# Patient Record
Sex: Female | Born: 1935 | Race: Black or African American | Hispanic: No | Smoking: Never smoker
Health system: Southern US, Community
[De-identification: ages and names within clinical notes are randomized; demographics above are authoritative.]

## PROBLEM LIST (undated history)

## (undated) DIAGNOSIS — J45909 Unspecified asthma, uncomplicated: Secondary | ICD-10-CM

## (undated) DIAGNOSIS — E119 Type 2 diabetes mellitus without complications: Secondary | ICD-10-CM

## (undated) DIAGNOSIS — M1990 Unspecified osteoarthritis, unspecified site: Secondary | ICD-10-CM

## (undated) DIAGNOSIS — I10 Essential (primary) hypertension: Secondary | ICD-10-CM

## (undated) HISTORY — PX: CATARACT EXTRACTION, BILATERAL: SHX1313

---

## 2010-04-30 DIAGNOSIS — E119 Type 2 diabetes mellitus without complications: Secondary | ICD-10-CM

## 2010-04-30 DIAGNOSIS — I10 Essential (primary) hypertension: Secondary | ICD-10-CM | POA: Diagnosis present

## 2015-01-24 DIAGNOSIS — H9193 Unspecified hearing loss, bilateral: Secondary | ICD-10-CM | POA: Diagnosis present

## 2017-07-21 DIAGNOSIS — I350 Nonrheumatic aortic (valve) stenosis: Secondary | ICD-10-CM

## 2017-07-21 DIAGNOSIS — I272 Pulmonary hypertension, unspecified: Secondary | ICD-10-CM | POA: Diagnosis present

## 2020-06-05 ENCOUNTER — Inpatient Hospital Stay: Payer: Medicaid Other | Admitting: Anesthesiology

## 2020-06-05 ENCOUNTER — Emergency Department: Payer: Medicaid Other

## 2020-06-05 ENCOUNTER — Inpatient Hospital Stay
Admission: EM | Admit: 2020-06-05 | Discharge: 2020-06-09 | DRG: 481 | Disposition: A | Discharge status: Home / Self Care | Payer: Medicaid Other | Attending: Hospitalist | Admitting: Hospitalist

## 2020-06-05 ENCOUNTER — Encounter
Admission: EM | Disposition: A | Discharge status: Home / Self Care | Payer: Self-pay | Source: Home / Self Care | Attending: Hospitalist

## 2020-06-05 ENCOUNTER — Other Ambulatory Visit: Payer: Self-pay

## 2020-06-05 ENCOUNTER — Inpatient Hospital Stay: Payer: Medicaid Other

## 2020-06-05 ENCOUNTER — Encounter: Payer: Self-pay | Admitting: Emergency Medicine

## 2020-06-05 DIAGNOSIS — E871 Hypo-osmolality and hyponatremia: Secondary | ICD-10-CM | POA: Diagnosis present

## 2020-06-05 DIAGNOSIS — E119 Type 2 diabetes mellitus without complications: Secondary | ICD-10-CM | POA: Diagnosis present

## 2020-06-05 DIAGNOSIS — Z794 Long term (current) use of insulin: Secondary | ICD-10-CM

## 2020-06-05 DIAGNOSIS — Z79899 Other long term (current) drug therapy: Secondary | ICD-10-CM | POA: Diagnosis not present

## 2020-06-05 DIAGNOSIS — Z7982 Long term (current) use of aspirin: Secondary | ICD-10-CM

## 2020-06-05 DIAGNOSIS — I272 Pulmonary hypertension, unspecified: Secondary | ICD-10-CM | POA: Diagnosis present

## 2020-06-05 DIAGNOSIS — S7292XA Unspecified fracture of left femur, initial encounter for closed fracture: Secondary | ICD-10-CM | POA: Diagnosis present

## 2020-06-05 DIAGNOSIS — S72402A Unspecified fracture of lower end of left femur, initial encounter for closed fracture: Secondary | ICD-10-CM

## 2020-06-05 DIAGNOSIS — T148XXA Other injury of unspecified body region, initial encounter: Secondary | ICD-10-CM

## 2020-06-05 DIAGNOSIS — Z66 Do not resuscitate: Secondary | ICD-10-CM | POA: Diagnosis not present

## 2020-06-05 DIAGNOSIS — W06XXXA Fall from bed, initial encounter: Secondary | ICD-10-CM | POA: Diagnosis present

## 2020-06-05 DIAGNOSIS — R339 Retention of urine, unspecified: Secondary | ICD-10-CM | POA: Diagnosis not present

## 2020-06-05 DIAGNOSIS — I350 Nonrheumatic aortic (valve) stenosis: Secondary | ICD-10-CM | POA: Diagnosis present

## 2020-06-05 DIAGNOSIS — I10 Essential (primary) hypertension: Secondary | ICD-10-CM | POA: Diagnosis present

## 2020-06-05 DIAGNOSIS — W19XXXA Unspecified fall, initial encounter: Secondary | ICD-10-CM

## 2020-06-05 DIAGNOSIS — H9193 Unspecified hearing loss, bilateral: Secondary | ICD-10-CM | POA: Diagnosis present

## 2020-06-05 DIAGNOSIS — Y92003 Bedroom of unspecified non-institutional (private) residence as the place of occurrence of the external cause: Secondary | ICD-10-CM

## 2020-06-05 DIAGNOSIS — S72455A Nondisplaced supracondylar fracture without intracondylar extension of lower end of left femur, initial encounter for closed fracture: Secondary | ICD-10-CM | POA: Diagnosis present

## 2020-06-05 DIAGNOSIS — Z880 Allergy status to penicillin: Secondary | ICD-10-CM | POA: Diagnosis not present

## 2020-06-05 DIAGNOSIS — R410 Disorientation, unspecified: Secondary | ICD-10-CM | POA: Diagnosis not present

## 2020-06-05 DIAGNOSIS — D62 Acute posthemorrhagic anemia: Secondary | ICD-10-CM | POA: Diagnosis not present

## 2020-06-05 DIAGNOSIS — E785 Hyperlipidemia, unspecified: Secondary | ICD-10-CM | POA: Diagnosis present

## 2020-06-05 DIAGNOSIS — D509 Iron deficiency anemia, unspecified: Secondary | ICD-10-CM | POA: Diagnosis not present

## 2020-06-05 DIAGNOSIS — Z6834 Body mass index (BMI) 34.0-34.9, adult: Secondary | ICD-10-CM | POA: Diagnosis not present

## 2020-06-05 DIAGNOSIS — J45909 Unspecified asthma, uncomplicated: Secondary | ICD-10-CM | POA: Diagnosis present

## 2020-06-05 DIAGNOSIS — Z01818 Encounter for other preprocedural examination: Secondary | ICD-10-CM

## 2020-06-05 DIAGNOSIS — Z7984 Long term (current) use of oral hypoglycemic drugs: Secondary | ICD-10-CM | POA: Diagnosis not present

## 2020-06-05 DIAGNOSIS — S72445A Nondisplaced fracture of lower epiphysis (separation) of left femur, initial encounter for closed fracture: Secondary | ICD-10-CM

## 2020-06-05 DIAGNOSIS — Z20822 Contact with and (suspected) exposure to covid-19: Secondary | ICD-10-CM | POA: Diagnosis present

## 2020-06-05 DIAGNOSIS — S72409A Unspecified fracture of lower end of unspecified femur, initial encounter for closed fracture: Secondary | ICD-10-CM

## 2020-06-05 DIAGNOSIS — Z888 Allergy status to other drugs, medicaments and biological substances status: Secondary | ICD-10-CM | POA: Diagnosis not present

## 2020-06-05 DIAGNOSIS — M1990 Unspecified osteoarthritis, unspecified site: Secondary | ICD-10-CM | POA: Diagnosis present

## 2020-06-05 HISTORY — DX: Morbid (severe) obesity due to excess calories: E66.01

## 2020-06-05 HISTORY — DX: Unspecified asthma, uncomplicated: J45.909

## 2020-06-05 HISTORY — DX: Essential (primary) hypertension: I10

## 2020-06-05 HISTORY — DX: Unspecified osteoarthritis, unspecified site: M19.90

## 2020-06-05 HISTORY — DX: Type 2 diabetes mellitus without complications: E11.9

## 2020-06-05 HISTORY — PX: ORIF FEMUR FRACTURE: SHX2119

## 2020-06-05 LAB — CBC WITH DIFFERENTIAL/PLATELET
Abs Immature Granulocytes: 0.04 10*3/uL (ref 0.00–0.07)
Basophils Absolute: 0 10*3/uL (ref 0.0–0.1)
Basophils Relative: 0 %
Eosinophils Absolute: 0.1 10*3/uL (ref 0.0–0.5)
Eosinophils Relative: 2 %
HCT: 31.1 % — ABNORMAL LOW (ref 36.0–46.0)
Hemoglobin: 9.6 g/dL — ABNORMAL LOW (ref 12.0–15.0)
Immature Granulocytes: 0 %
Lymphocytes Relative: 25 %
Lymphs Abs: 2.3 10*3/uL (ref 0.7–4.0)
MCH: 20.3 pg — ABNORMAL LOW (ref 26.0–34.0)
MCHC: 30.9 g/dL (ref 30.0–36.0)
MCV: 65.6 fL — ABNORMAL LOW (ref 80.0–100.0)
Monocytes Absolute: 0.8 10*3/uL (ref 0.1–1.0)
Monocytes Relative: 8 %
Neutro Abs: 5.9 10*3/uL (ref 1.7–7.7)
Neutrophils Relative %: 65 %
Platelets: 205 10*3/uL (ref 150–400)
RBC: 4.74 MIL/uL (ref 3.87–5.11)
RDW: 15.6 % — ABNORMAL HIGH (ref 11.5–15.5)
Smear Review: NORMAL
WBC: 9.2 10*3/uL (ref 4.0–10.5)
nRBC: 0 % (ref 0.0–0.2)

## 2020-06-05 LAB — GLUCOSE, CAPILLARY
Glucose-Capillary: 149 mg/dL — ABNORMAL HIGH (ref 70–99)
Glucose-Capillary: 147 mg/dL — ABNORMAL HIGH (ref 70–99)
Glucose-Capillary: 141 mg/dL — ABNORMAL HIGH (ref 70–99)
Glucose-Capillary: 186 mg/dL — ABNORMAL HIGH (ref 70–99)
Glucose-Capillary: 210 mg/dL — ABNORMAL HIGH (ref 70–99)
Glucose-Capillary: 198 mg/dL — ABNORMAL HIGH (ref 70–99)

## 2020-06-05 LAB — HEMOGLOBIN A1C
Hgb A1c MFr Bld: 7 % — ABNORMAL HIGH (ref 4.8–5.6)
Mean Plasma Glucose: 154.2 mg/dL

## 2020-06-05 LAB — PROTIME-INR
INR: 1 (ref 0.8–1.2)
Prothrombin Time: 12.4 seconds (ref 11.4–15.2)

## 2020-06-05 LAB — BASIC METABOLIC PANEL
Anion gap: 8 (ref 5–15)
BUN: 24 mg/dL — ABNORMAL HIGH (ref 8–23)
CO2: 24 mmol/L (ref 22–32)
Calcium: 9.5 mg/dL (ref 8.9–10.3)
Chloride: 99 mmol/L (ref 98–111)
Creatinine, Ser: 0.78 mg/dL (ref 0.44–1.00)
GFR, Estimated: >60 mL/min (ref >60)
Glucose, Bld: 174 mg/dL — ABNORMAL HIGH (ref 70–99)
Potassium: 4.2 mmol/L (ref 3.5–5.1)
Sodium: 131 mmol/L — ABNORMAL LOW (ref 135–145)

## 2020-06-05 LAB — SARS CORONAVIRUS 2 BY RT PCR (HOSPITAL ORDER, PERFORMED IN ~~LOC~~ HOSPITAL LAB): SARS Coronavirus 2: NEGATIVE

## 2020-06-05 LAB — CBG MONITORING, ED: Glucose-Capillary: 150 mg/dL — ABNORMAL HIGH (ref 70–99)

## 2020-06-05 LAB — TYPE AND SCREEN
ABO/RH(D): A POS
Antibody Screen: NEGATIVE

## 2020-06-05 SURGERY — OPEN REDUCTION INTERNAL FIXATION (ORIF) DISTAL FEMUR FRACTURE
Anesthesia: Spinal | Laterality: Left

## 2020-06-05 MED ORDER — PROPOFOL 10 MG/ML IV BOLUS
INTRAVENOUS | Status: AC
  Filled 2020-06-05: qty 20

## 2020-06-05 MED ORDER — PROPOFOL 500 MG/50ML IV EMUL
INTRAVENOUS | Status: DC | PRN
  Administered 2020-06-05: 25 ug/kg/min via INTRAVENOUS

## 2020-06-05 MED ORDER — PRAVASTATIN SODIUM 20 MG PO TABS
40.0000 mg | ORAL_TABLET | Freq: Every day | ORAL | Status: DC
  Administered 2020-06-06 – 2020-06-08 (×3): 40 mg via ORAL
  Filled 2020-06-05 (×4): qty 2

## 2020-06-05 MED ORDER — CEFAZOLIN SODIUM-DEXTROSE 2-4 GM/100ML-% IV SOLN
2.0000 g | INTRAVENOUS | Status: AC
  Administered 2020-06-05: 2 g via INTRAVENOUS
  Filled 2020-06-05: qty 100

## 2020-06-05 MED ORDER — MAGNESIUM HYDROXIDE 400 MG/5ML PO SUSP: 30.0000 mL | Freq: Every day | ORAL | Status: DC | PRN

## 2020-06-05 MED ORDER — HYDROCODONE-ACETAMINOPHEN 5-325 MG PO TABS: 1.0000 | ORAL_TABLET | Freq: Four times a day (QID) | ORAL | Status: DC | PRN

## 2020-06-05 MED ORDER — KETAMINE HCL 50 MG/ML IJ SOLN
INTRAMUSCULAR | Status: DC | PRN
  Administered 2020-06-05 (×2): 25 mg via INTRAVENOUS

## 2020-06-05 MED ORDER — ENOXAPARIN SODIUM 60 MG/0.6ML ~~LOC~~ SOLN
0.5000 mg/kg | SUBCUTANEOUS | Status: DC
  Administered 2020-06-06 – 2020-06-09 (×4): 42.5 mg via SUBCUTANEOUS
  Filled 2020-06-05 (×4): qty 0.6

## 2020-06-05 MED ORDER — METOCLOPRAMIDE HCL 10 MG PO TABS: 5.0000 mg | ORAL_TABLET | Freq: Three times a day (TID) | ORAL | Status: DC | PRN

## 2020-06-05 MED ORDER — KETAMINE HCL 50 MG/5ML IJ SOSY
PREFILLED_SYRINGE | INTRAMUSCULAR | Status: AC
  Filled 2020-06-05: qty 5

## 2020-06-05 MED ORDER — METOPROLOL TARTRATE 25 MG PO TABS
12.5000 mg | ORAL_TABLET | Freq: Two times a day (BID) | ORAL | Status: DC
  Administered 2020-06-06 – 2020-06-09 (×5): 12.5 mg via ORAL
  Filled 2020-06-05 (×6): qty 1

## 2020-06-05 MED ORDER — ACETAMINOPHEN 325 MG PO TABS
325.0000 mg | ORAL_TABLET | Freq: Four times a day (QID) | ORAL | Status: DC | PRN
  Administered 2020-06-06: 650 mg via ORAL
  Administered 2020-06-06: 325 mg via ORAL
  Administered 2020-06-07 (×2): 650 mg via ORAL
  Filled 2020-06-05 (×4): qty 2

## 2020-06-05 MED ORDER — PHENYLEPHRINE HCL (PRESSORS) 10 MG/ML IV SOLN
INTRAVENOUS | Status: DC | PRN
  Administered 2020-06-05 (×3): 100 ug via INTRAVENOUS

## 2020-06-05 MED ORDER — BUPIVACAINE HCL (PF) 0.5 % IJ SOLN
INTRAMUSCULAR | Status: AC
  Filled 2020-06-05: qty 10

## 2020-06-05 MED ORDER — ONDANSETRON HCL 4 MG/2ML IJ SOLN: 4.0000 mg | Freq: Four times a day (QID) | INTRAMUSCULAR | Status: DC | PRN

## 2020-06-05 MED ORDER — TRAMADOL HCL 50 MG PO TABS: 50.0000 mg | ORAL_TABLET | Freq: Four times a day (QID) | ORAL | Status: DC | PRN

## 2020-06-05 MED ORDER — OXYCODONE HCL 5 MG PO TABS: 5.0000 mg | ORAL_TABLET | Freq: Once | ORAL | Status: DC | PRN

## 2020-06-05 MED ORDER — DIPHENHYDRAMINE HCL 12.5 MG/5ML PO ELIX
12.5000 mg | ORAL_SOLUTION | ORAL | Status: DC | PRN
Start: 2020-06-05 — End: 2020-06-09

## 2020-06-05 MED ORDER — FLEET ENEMA 7-19 GM/118ML RE ENEM: 1.0000 | ENEMA | Freq: Once | RECTAL | Status: DC | PRN

## 2020-06-05 MED ORDER — ASPIRIN EC 81 MG PO TBEC
81.0000 mg | DELAYED_RELEASE_TABLET | Freq: Every day | ORAL | Status: DC
  Administered 2020-06-07 – 2020-06-09 (×3): 81 mg via ORAL
  Filled 2020-06-05 (×3): qty 1

## 2020-06-05 MED ORDER — MORPHINE SULFATE (PF) 4 MG/ML IV SOLN
4.0000 mg | INTRAVENOUS | Status: DC | PRN
  Administered 2020-06-05: 4 mg via INTRAVENOUS
  Filled 2020-06-05: qty 1

## 2020-06-05 MED ORDER — SODIUM CHLORIDE 0.9 % IV SOLN: INTRAVENOUS | Status: DC

## 2020-06-05 MED ORDER — PROPOFOL 10 MG/ML IV BOLUS
INTRAVENOUS | Status: DC | PRN
  Administered 2020-06-05: 25 mg via INTRAVENOUS

## 2020-06-05 MED ORDER — QUETIAPINE FUMARATE 25 MG PO TABS: 50.0000 mg | ORAL_TABLET | Freq: Every evening | ORAL | Status: DC | PRN

## 2020-06-05 MED ORDER — FENTANYL CITRATE (PF) 100 MCG/2ML IJ SOLN
25.0000 ug | Freq: Once | INTRAMUSCULAR | Status: AC
  Administered 2020-06-05: 25 ug via INTRAVENOUS
  Filled 2020-06-05: qty 2

## 2020-06-05 MED ORDER — OXYCODONE HCL 5 MG/5ML PO SOLN: 5.0000 mg | Freq: Once | ORAL | Status: DC | PRN

## 2020-06-05 MED ORDER — ADULT MULTIVITAMIN W/MINERALS CH
1.0000 | ORAL_TABLET | Freq: Every day | ORAL | Status: DC
  Administered 2020-06-07 – 2020-06-09 (×3): 1 via ORAL
  Filled 2020-06-05 (×4): qty 1

## 2020-06-05 MED ORDER — BUPIVACAINE HCL (PF) 0.5 % IJ SOLN
INTRAMUSCULAR | Status: DC | PRN
  Administered 2020-06-05: 2 mL via INTRATHECAL

## 2020-06-05 MED ORDER — MORPHINE SULFATE (PF) 2 MG/ML IV SOLN
0.5000 mg | INTRAVENOUS | Status: DC | PRN
  Administered 2020-06-05 (×2): 0.5 mg via INTRAVENOUS
  Filled 2020-06-05 (×2): qty 1

## 2020-06-05 MED ORDER — INSULIN ASPART 100 UNIT/ML ~~LOC~~ SOLN
0.0000 [IU] | SUBCUTANEOUS | Status: DC
  Administered 2020-06-05: 2 [IU] via SUBCUTANEOUS
  Administered 2020-06-05: 5 [IU] via SUBCUTANEOUS
  Administered 2020-06-06 (×3): 3 [IU] via SUBCUTANEOUS
  Administered 2020-06-06: 5 [IU] via SUBCUTANEOUS
  Administered 2020-06-07 (×2): 2 [IU] via SUBCUTANEOUS
  Administered 2020-06-07: 3 [IU] via SUBCUTANEOUS
  Administered 2020-06-07: 2 [IU] via SUBCUTANEOUS
  Administered 2020-06-08: 3 [IU] via SUBCUTANEOUS
  Administered 2020-06-08: 2 [IU] via SUBCUTANEOUS
  Administered 2020-06-08 – 2020-06-09 (×3): 3 [IU] via SUBCUTANEOUS
  Filled 2020-06-05 (×15): qty 1

## 2020-06-05 MED ORDER — GLUCERNA SHAKE PO LIQD
237.0000 mL | Freq: Three times a day (TID) | ORAL | Status: DC
  Administered 2020-06-06 – 2020-06-09 (×5): 237 mL via ORAL
  Filled 2020-06-05 (×5): qty 237

## 2020-06-05 MED ORDER — FENTANYL CITRATE (PF) 100 MCG/2ML IJ SOLN
INTRAMUSCULAR | Status: AC
  Filled 2020-06-05: qty 2

## 2020-06-05 MED ORDER — CEFAZOLIN SODIUM-DEXTROSE 2-4 GM/100ML-% IV SOLN
INTRAVENOUS | Status: AC
  Filled 2020-06-05: qty 100

## 2020-06-05 MED ORDER — METOCLOPRAMIDE HCL 5 MG/ML IJ SOLN: 5.0000 mg | Freq: Three times a day (TID) | INTRAMUSCULAR | Status: DC | PRN

## 2020-06-05 MED ORDER — LIDOCAINE HCL (PF) 2 % IJ SOLN
INTRAMUSCULAR | Status: DC | PRN
  Administered 2020-06-05: 50 mg

## 2020-06-05 MED ORDER — SODIUM CHLORIDE 0.9 % IV SOLN
INTRAVENOUS | Status: DC | PRN
  Administered 2020-06-05: 35 ug/min via INTRAVENOUS

## 2020-06-05 MED ORDER — ACETAMINOPHEN 500 MG PO TABS
1000.0000 mg | ORAL_TABLET | Freq: Once | ORAL | Status: AC
  Administered 2020-06-05: 1000 mg via ORAL
  Filled 2020-06-05: qty 2

## 2020-06-05 MED ORDER — DOCUSATE SODIUM 100 MG PO CAPS
100.0000 mg | ORAL_CAPSULE | Freq: Two times a day (BID) | ORAL | Status: DC
  Administered 2020-06-06 – 2020-06-09 (×6): 100 mg via ORAL
  Filled 2020-06-05 (×7): qty 1

## 2020-06-05 MED ORDER — BISACODYL 10 MG RE SUPP: 10.0000 mg | Freq: Every day | RECTAL | Status: DC | PRN

## 2020-06-05 MED ORDER — BUPIVACAINE HCL (PF) 0.5 % IJ SOLN
INTRAMUSCULAR | Status: AC
  Filled 2020-06-05: qty 30

## 2020-06-05 MED ORDER — LIDOCAINE HCL (PF) 2 % IJ SOLN
INTRAMUSCULAR | Status: AC
  Filled 2020-06-05: qty 5

## 2020-06-05 MED ORDER — FENTANYL CITRATE (PF) 100 MCG/2ML IJ SOLN
25.0000 ug | INTRAMUSCULAR | Status: DC | PRN
  Administered 2020-06-05: 25 ug via INTRAVENOUS

## 2020-06-05 MED ORDER — NEOMYCIN-POLYMYXIN B GU 40-200000 IR SOLN
Status: AC
  Filled 2020-06-05: qty 4

## 2020-06-05 MED ORDER — ACETAMINOPHEN 500 MG PO TABS: 500.0000 mg | ORAL_TABLET | Freq: Four times a day (QID) | ORAL | Status: AC

## 2020-06-05 MED ORDER — CEFAZOLIN SODIUM-DEXTROSE 2-4 GM/100ML-% IV SOLN
2.0000 g | Freq: Four times a day (QID) | INTRAVENOUS | Status: AC
  Administered 2020-06-05 – 2020-06-06 (×3): 2 g via INTRAVENOUS
  Filled 2020-06-05 (×3): qty 100

## 2020-06-05 MED ORDER — ONDANSETRON HCL 4 MG PO TABS: 4.0000 mg | ORAL_TABLET | Freq: Four times a day (QID) | ORAL | Status: DC | PRN

## 2020-06-05 MED ORDER — MORPHINE SULFATE (PF) 2 MG/ML IV SOLN: 0.5000 mg | INTRAVENOUS | Status: DC | PRN

## 2020-06-05 SURGICAL SUPPLY — 48 items
BIT DRILL 4.3 (BIT) ×3 IMPLANT
BIT DRILL 4.3X300MM (BIT) ×1 IMPLANT
BNDG COHESIVE 6X5 TAN STRL LF (GAUZE/BANDAGES/DRESSINGS) ×2 IMPLANT
BNDG ELASTIC 6X5.8 VLCR STR LF (GAUZE/BANDAGES/DRESSINGS) IMPLANT
BRACE KNEE POST OP SHORT (BRACE) IMPLANT
CANISTER SUCT 1200ML W/VALVE (MISCELLANEOUS) ×2 IMPLANT
CAP LOCK NCB (Cap) ×10 IMPLANT
CHLORAPREP W/TINT 26 (MISCELLANEOUS) ×2 IMPLANT
COVER BACK TABLE REUSABLE LG (DRAPES) ×2 IMPLANT
COVER WAND RF STERILE (DRAPES) ×2 IMPLANT
DRAPE C-ARM XRAY 36X54 (DRAPES) ×2 IMPLANT
DRAPE C-ARMOR (DRAPES) ×2 IMPLANT
DRAPE INCISE IOBAN 66X60 STRL (DRAPES) ×4 IMPLANT
DRAPE SPLIT 6X30 W/TAPE (DRAPES) ×4 IMPLANT
DRAPE U-SHAPE 47X51 STRL (DRAPES) ×2 IMPLANT
DRILL BIT 4.3 (BIT) ×2
DRSG OPSITE POSTOP 4X6 (GAUZE/BANDAGES/DRESSINGS) ×2 IMPLANT
DRSG OPSITE POSTOP 4X8 (GAUZE/BANDAGES/DRESSINGS) IMPLANT
ELECT REM PT RETURN 9FT ADLT (ELECTROSURGICAL) ×2
ELECTRODE REM PT RTRN 9FT ADLT (ELECTROSURGICAL) ×1 IMPLANT
GAUZE SPONGE 4X4 12PLY STRL (GAUZE/BANDAGES/DRESSINGS) ×2 IMPLANT
GAUZE XEROFORM 1X8 LF (GAUZE/BANDAGES/DRESSINGS) ×2 IMPLANT
GLOVE BIO SURGEON STRL SZ8 (GLOVE) ×4 IMPLANT
GLOVE INDICATOR 8.0 STRL GRN (GLOVE) ×2 IMPLANT
GOWN STRL REUS W/ TWL LRG LVL3 (GOWN DISPOSABLE) ×1 IMPLANT
GOWN STRL REUS W/ TWL XL LVL3 (GOWN DISPOSABLE) ×1 IMPLANT
GOWN STRL REUS W/TWL LRG LVL3 (GOWN DISPOSABLE) ×2
GOWN STRL REUS W/TWL XL LVL3 (GOWN DISPOSABLE) ×2
K-WIRE 2.0 (WIRE) ×2
K-WIRE FXSTD 280X2XNS SS (WIRE) ×1
KIT TURNOVER KIT A (KITS) ×2 IMPLANT
KWIRE FXSTD 280X2XNS SS (WIRE) ×1 IMPLANT
MANIFOLD NEPTUNE II (INSTRUMENTS) ×2 IMPLANT
NS IRRIG 1000ML POUR BTL (IV SOLUTION) ×2 IMPLANT
PACK HIP PROSTHESIS (MISCELLANEOUS) ×2 IMPLANT
PLATE 9H LEFT NCB (Plate) ×2 IMPLANT
SCREW 5.0 80MM (Screw) ×4 IMPLANT
SCREW NCB 3.5X75X5X6.2XST (Screw) ×1 IMPLANT
SCREW NCB 5.0X36MM (Screw) ×4 IMPLANT
SCREW NCB 5.0X38 (Screw) ×2 IMPLANT
SCREW NCB 5.0X46MM (Screw) ×2 IMPLANT
SCREW NCB 5.0X75MM (Screw) ×2 IMPLANT
SCREW NCB 5.0X85MM (Screw) ×2 IMPLANT
SPONGE LAP 18X18 RF (DISPOSABLE) ×2 IMPLANT
STAPLER SKIN PROX 35W (STAPLE) ×2 IMPLANT
STOCKINETTE M/LG 89821 (MISCELLANEOUS) ×2 IMPLANT
SUT VIC AB 2-0 CT1 27 (SUTURE) ×4
SUT VIC AB 2-0 CT1 TAPERPNT 27 (SUTURE) ×2 IMPLANT

## 2020-06-05 NOTE — Anesthesia Procedure Notes (Signed)
Spinal  Patient location during procedure: OR Staffing Performed: anesthesiologist and resident/CRNA  Anesthesiologist: Piscitello, Precious Haws, MD Resident/CRNA: Rolla Plate, CRNA Preanesthetic Checklist Completed: patient identified, IV checked, site marked, risks and benefits discussed, surgical consent, monitors and equipment checked, pre-op evaluation and timeout performed Spinal Block Patient position: right lateral decubitus Prep: ChloraPrep and site prepped and draped Patient monitoring: heart rate, continuous pulse ox, blood pressure and cardiac monitor Approach: midline Location: L4-5 Injection technique: single-shot Needle Needle type: Introducer and Pencan  Needle gauge: 24 G Needle length: 9 cm Assessment Sensory level: T10 Additional Notes Negative paresthesia. Negative blood return. Positive free-flowing CSF. Expiration date of kit checked and confirmed. Patient tolerated procedure well, without complications.

## 2020-06-05 NOTE — Op Note (Signed)
06/05/2020  3:29 PM  Patient:   Natalie Adams  Pre-Op Diagnosis:   Nondisplaced left supracondylar femur fracture.  Post-Op Diagnosis:   Same  Procedure:   Open reduction and internal fixation of left supracondylar femur fracture.  Surgeon:   Maryagnes Amos, MD  Assistant:   None  Anesthesia:   Spinal  Findings:   As above.  Complications:   None  Fluids:   500 cc crystalloid  EBL:   50 cc  UOP:   None  TT:   None  Drains:   None  Closure:   Staples  Implants:   [employer] 9-hole precontoured supracondylar femoral plate with 5 distal screws with locking caps, 1 lag screw, and 3 bicortical proximal screws  Brief Clinical Note:   The patient is an 87 year old female who sustained the above-noted injury last evening when she apparently lost her balance and fell trying to get out of her wheelchair. She was brought to the emergency room where x-rays demonstrated the above-noted injury. The patient has been cleared medically and presents at this time for definitive management of his injury.  Procedure:   The patient was brought into the operating room. After adequate spinal anesthesia was obtained, anesthesia were obtained, the patient was lain in the supine position. The patient's left lower extremity was prepped with ChloraPrep solution before being draped sterilely. Preoperative antibiotics were administered. A timeout was performed to verify the appropriate surgical site.   An approximately 10-12 cm incision was made along the lateral aspect of the distal femur, extending down to the joint line laterally. The incision was carried down through the subcutaneous tissues to expose the iliotibial band. This was split the length of the incision to expose the lateral aspect of the lateral femoral condyle. A 9-hole [employer] precontoured supracondylar femoral plate was selected. The plate was carefully positioned utilizing fluoroscopic imaging in AP and lateral projections before  it was temporarily secured using K wires proximally and distally.    Utilizing a short incision laterally, a bicortical screw was placed just proximal to the fracture site to help reduce the plate to the femoral shaft. Distally, the plate was secured using five fully threaded cancellus screws. The adequacy of screw position was verified fluoroscopically in AP and lateral projections and found to be excellent. Each of the screws was covered with a locking cap.  Proximally, the plate was secured using two more bicortical screws of appropriate length. These screws were placed through stab incisions. Locking caps were placed over the distal 2 screws. Again the adequacy of hardware position and overall femoral alignment/fracture reduction was verified fluoroscopically in AP and lateral projections and found to be excellent.  The wounds were copiously irrigated with bacitracin saline solution using bulb irrigation before the iliotibial band was reapproximated using #0 Vicryl interrupted sutures. The subcutaneous tissues were closed in two layers using 2-0 Vicryl interrupted sutures before the skin was closed with staples. A total of 30 cc of 0.5% Sensorcaine with epinephrine was injected in around the incision sites to help with postoperative analgesia before a sterile occlusive dressing was applied to the thigh. The patient was then placed into a hinged knee brace with hinges set at 0-90 degrees but locked in extension. The patient was then awakened and returned to the recovery room in satisfactory condition after tolerating the procedure well.

## 2020-06-05 NOTE — H&P (Addendum)
History and Physical    Natalie Adams NIO:[id number] DOB: 1929-10-27 DOA: 06/05/2020  PCP: [facility]   Patient coming from: home  I have personally briefly reviewed patient's old medical records in [facility]  Chief Complaint: Fall, left leg pain  HPI: Natalie Adams is a 87 y.o. female with medical history significant for HTN, DM, bilateral hearing loss, mild aortic stenosis and pulmonary hypertension, brought to the emergency room for evaluation of left leg pain following a fall onto her knees about 20 hours prior resulting in left leg pain.  Most of the history is provided by daughter at bedside due to mother's hearing loss.  Patient apparently tried to get out of bed unassisted, falling onto her knees.  Was helped back up to bed with [facility] lift but as the day progressed was noted to have pain and swelling to the left thigh, just above the knee.  Patient was previously in her usual state of health.  No recent illness.  Denies recent fever, chills, cough or shortness of breath, nausea vomiting, abdominal pain or diarrhea or dysuria. ED Course: On arrival in the emergency room, vitals were all within normal limits.  Blood work significant for anemia of 9.6, with no recent baseline for comparison and mild hyponatremia of 131. EKG : Pending Imaging: X-ray left knee showing nondisplaced spiral distal femur fracture.  Chest x-ray clear hip x-ray showing pelvic ring intact  The ED provider contacted orthopedist Dr. Joice Lofts who will take patient to the OR later today  Review of Systems: Limited by hearing impairment   Past Medical History:  Diagnosis Date  . Arthritis   . Asthma   . Diabetes mellitus without complication (HCC)   . Hypertension   . Morbid obesity (HCC)     Past Surgical History:  Procedure Laterality Date  . CATARACT EXTRACTION, BILATERAL       reports that she has never smoked. She has never used smokeless tobacco. No history on file for alcohol use  and drug use.  Allergies  Allergen Reactions  . Amoxicillin-Pot Clavulanate Diarrhea  . Hydrochlorothiazide Other (See Comments)    "she got shaky and fell" "she got shaky and fell"     History reviewed. No pertinent family history.    Prior to Admission medications   Medication Sig Start Date End Date Taking? Authorizing Provider  amLODipine (NORVASC) 10 MG tablet Take by mouth. 05/01/17  Yes [provider]  aspirin 81 MG EC tablet Take by mouth. 05/15/09  Yes [provider]  insulin lispro (HUMALOG) 100 UNIT/ML injection Sliding scale insulin # of units = (BS-150/50) TID AC 05/09/16  Yes [provider]  insulin NPH Human (NOVOLIN N) 100 UNIT/ML injection Inject 15 Units into the skin daily before breakfast. 05/09/16  Yes [provider]  Multiple Vitamins tablet Take by mouth. 02/11/11  Yes [provider]  ascorbic acid (VITAMIN C) 1000 MG tablet Take by mouth.    [provider]  Cholecalciferol 125 MCG (5000 UT) TABS Take 1 tablet by mouth daily.    [provider]  Dietary Management Product (ALGESIS) TABS Take by mouth.    [provider]  ipratropium-albuterol (DUONEB) 0.5-2.5 (3) MG/3ML SOLN SMARTSIG:1 Ampule(s) Via Nebulizer Every 6 Hours PRN 03/25/20   [provider]  metFORMIN (GLUCOPHAGE) 1000 MG tablet Take by mouth in the morning and at bedtime. 05/08/20   [provider]  metoprolol tartrate (LOPRESSOR) 25 MG tablet Take 12.5 mg by mouth 2 (  two) times daily. 05/08/20   [provider]  Omega-3 Fatty Acids (FISH OIL) 1200 MG CAPS Take by mouth.    [provider]  pravastatin (PRAVACHOL) 40 MG tablet Take 40 mg by mouth at bedtime. 05/08/20   [provider]    Physical Exam: Vitals:   06/05/20 0022 06/05/20 0026 06/05/20 0239  BP:  123/82 (!) 147/73  Pulse:  92 95  Resp:  18 17  Temp:  98.4 F (36.9 C)   TempSrc:  Oral   SpO2:  99% 100%  Weight: 86.2  kg    Height: 5\' 2"  (1.575 m)       Vitals:   06/05/20 0022 06/05/20 0026 06/05/20 0239  BP:  123/82 (!) 147/73  Pulse:  92 95  Resp:  18 17  Temp:  98.4 F (36.9 C)   TempSrc:  Oral   SpO2:  99% 100%  Weight: 86.2 kg    Height: 5\' 2"  (1.575 m)        Constitutional: Alert and oriented x 3 .  In mild pain distress HEENT:      Head: Normocephalic and atraumatic.         Eyes: PERLA, EOMI, Conjunctivae are normal. Sclera is non-icteric.       Mouth/Throat: Mucous membranes are moist.       Neck: Supple with no signs of meningismus. Cardiovascular: Regular rate and rhythm. No murmurs, gallops, or rubs. 2+ symmetrical distal pulses are present . No JVD. No LE edema Respiratory: Respiratory effort normal .Lungs sounds clear bilaterally. No wheezes, crackles, or rhonchi.  Gastrointestinal: Soft, non tender, and non distended with positive bowel sounds.  Genitourinary: No CVA tenderness. Musculoskeletal:  Swelling left anterior thigh just above the knee with tenderness. No cyanosis, or erythema of extremities. Neurologic:  Face is symmetric. Moving all extremities. No gross focal neurologic deficits . Skin: Skin is warm, dry.  No rash or ulcers Psychiatric: Mood and affect are normal    Labs on Admission: I have personally reviewed following labs and imaging studies  CBC: Recent Labs  Lab 06/05/20 0222  WBC 9.2  NEUTROABS 5.9  HGB 9.6*  HCT 31.1*  MCV 65.6*  PLT 205   Basic Metabolic Panel: Recent Labs  Lab 06/05/20 0222  NA 131*  K 4.2  CL 99  CO2 24  GLUCOSE 174*  BUN 24*  CREATININE 0.78  CALCIUM 9.5   GFR: Estimated Creatinine Clearance: 49.5 mL/min (by C-G formula based on SCr of 0.78 mg/dL). Liver Function Tests: No results for input(s): AST, ALT, ALKPHOS, BILITOT, PROT, ALBUMIN in the last 168 hours. No results for input(s): LIPASE, AMYLASE in the last 168 hours. No results for input(s): AMMONIA in the last 168 hours. Coagulation Profile: Recent  Labs  Lab 06/05/20 0300  INR 1.0   Cardiac Enzymes: No results for input(s): CKTOTAL, CKMB, CKMBINDEX, TROPONINI in the last 168 hours. BNP (last 3 results) No results for input(s): PROBNP in the last 8760 hours. HbA1C: No results for input(s): HGBA1C in the last 72 hours. CBG: No results for input(s): GLUCAP in the last 168 hours. Lipid Profile: No results for input(s): CHOL, HDL, LDLCALC, TRIG, CHOLHDL, LDLDIRECT in the last 72 hours. Thyroid Function Tests: No results for input(s): TSH, T4TOTAL, FREET4, T3FREE, THYROIDAB in the last 72 hours. Anemia Panel: No results for input(s): VITAMINB12, FOLATE, FERRITIN, TIBC, IRON, RETICCTPCT in the last 72 hours. Urine analysis: No results found for: COLORURINE, APPEARANCEUR, LABSPEC, PHURINE, GLUCOSEU, HGBUR, BILIRUBINUR, KETONESUR,  PROTEINUR, UROBILINOGEN, NITRITE, LEUKOCYTESUR  Radiological Exams on Admission: DG Chest Port 1 View  Result Date: 06/05/2020 CLINICAL DATA:  Preop evaluation for upcoming orthopedic surgery, known left EXAM: PORTABLE CHEST 1 VIEW COMPARISON:  None. FINDINGS: Cardiac shadow is mildly enlarged. The lungs are well aerated bilaterally. No focal infiltrate or effusion is seen. No bony abnormality is noted. IMPRESSION: No acute abnormality seen. Electronically Signed   By: Alcide Clever M.D.   On: 06/05/2020 03:24   DG Knee Complete 4 Views Left  Result Date: 06/05/2020 CLINICAL DATA:  Pain and swelling after fall EXAM: LEFT KNEE - COMPLETE 4+ VIEW COMPARISON:  None. FINDINGS: There is a nondisplaced spiral type fracture of the distal left femur. No dislocation at the knee. IMPRESSION: Nondisplaced spiral type fracture of the distal left femur. Electronically Signed   By: Deatra Robinson M.D.   On: 06/05/2020 02:33   DG Hip Unilat W or Wo Pelvis 2-3 Views Left  Result Date: 06/05/2020 CLINICAL DATA:  Recent fall with left hip pain, initial encounter EXAM: DG HIP (WITH OR WITHOUT PELVIS) 3V LEFT COMPARISON:  None.  FINDINGS: Pelvic ring is intact. Mild degenerative changes of the hip joints are noted. No acute fracture or dislocation is seen. No soft tissue abnormality is noted. IMPRESSION: No acute abnormality noted. Electronically Signed   By: Alcide Clever M.D.   On: 06/05/2020 00:39     Assessment/Plan 87 year old female with history of HTN, DM, bilateral hearing loss, mild aortic stenosis and pulmonary hypertension, with distal femur fracture following an unwitnessed fall several hours earlier.    Fracture of distal left femur (HCC)   Accidental fall from bed, initial encounter   Preoperative clearance -Unwitnessed fall onto knees with development of pain and swelling just above the left knee several hours later -X-ray left knee showing nondisplaced spiral fracture of the distal left femur -N.p.o. after midnight -Pain control -Further orders per orthopedics, Dr. Joice Lofts who will take patient to the OR later -Patient at low to moderate risk for perioperative cardiopulmonary complications once no acute findings on EKG)    Hypertension, benign -Continue home amlodipine and metoprolol    Type II diabetes mellitus (HCC) -Sliding scale insulin coverage    Pulmonary hypertension (HCC) -No acute concerns at this time    Bilateral hearing loss -Increase nursing assistance for communication    Aortic stenosis -No acute concerns at this time    DVT prophylaxis: SCDs Code Status: full code  Family Communication: Daughter Disposition Plan: Back to previous home environment Consults called: Orthopedics Status:At the time of admission, it appears that the appropriate admission status for this patient is INPATIENT. This is judged to be reasonable and necessary in order to provide the required intensity of service to ensure the patient's safety given the presenting symptoms, physical exam findings, and initial radiographic and laboratory data in the context of their  Comorbid conditions.   Patient  requires inpatient status due to high intensity of service, high risk for further deterioration and high frequency of surveillance required.   I certify that at the point of admission it is my clinical judgment that the patient will require inpatient hospital care spanning beyond 2 midnights     Andris Baumann MD [facility]     06/05/2020, 4:14 AM

## 2020-06-05 NOTE — Progress Notes (Signed)
PHARMACIST - PHYSICIAN COMMUNICATION  CONCERNING:  Enoxaparin (Lovenox) for DVT Prophylaxis    RECOMMENDATION: Patient was prescribed enoxaprin 40mg  q24 hours for VTE prophylaxis.   Filed Weights   06/05/20 0022 06/05/20 1245  Weight: 86.2 kg (190 lb) 86.2 kg (190 lb 0.6 oz)    Body mass index is 34.76 kg/m.  Estimated Creatinine Clearance: 49.5 mL/min (by C-G formula based on SCr of 0.78 mg/dL).   Based on [facility] policy patient is candidate for enoxaparin 0.5mg /kg TBW SQ every 24 hours based on BMI being >30.  DESCRIPTION: Pharmacy has adjusted enoxaparin dose per [facility] policy.  Patient is now receiving enoxaparin 42.5 mg every 24 hours   [facility], PharmD Pharmacy Resident  06/05/2020 5:40 PM

## 2020-06-05 NOTE — Transfer of Care (Signed)
Immediate Anesthesia Transfer of Care Note  Patient: Natalie Adams  Procedure(s) Performed: OPEN REDUCTION INTERNAL FIXATION (ORIF) DISTAL FEMUR FRACTURE (Left )  Patient Location: PACU  Anesthesia Type:Spinal  Level of Consciousness: awake  Airway & Oxygen Therapy: Patient Spontanous Breathing  Post-op Assessment: Report given to RN and Post -op Vital signs reviewed and stable  Post vital signs: Reviewed  Last Vitals:  Vitals Value Taken Time  BP 126/63 06/05/20 1527  Temp    Pulse 77 06/05/20 1529  Resp 19 06/05/20 1529  SpO2 100 % 06/05/20 1529  Vitals shown include unvalidated device data.  Last Pain:  Vitals:   06/05/20 1245  TempSrc: Temporal  PainSc:          Complications: No complications documented.

## 2020-06-05 NOTE — ED Provider Notes (Addendum)
[facility] Emergency Department Provider Note  ____________________________________________   Event Date/Time   First MD Initiated Contact with Patient 06/05/20 0125     (approximate)  I have reviewed the triage vital signs and the nursing notes.   HISTORY  Chief Complaint Fall  Level 5 caveat: The patient is elderly and very hard of hearing.  The daughter has been caring for her for years and provides most of the history.  HPI Natalie Adams is a 87 y.o. female with medical history as listed below who presents by EMS for evaluation after a fall.  The fall occurred nearly 24 hours ago.  Her daughter is at bedside and provides all of the patient's care at home.  She said that she typically checks on her mother multiple times a night and she waited a little bit longer to let her sleep, but then at around 3:45 AM  she heard a thud.  She went and found her mother on her knees on the floor.  She was able to get her back into the bed using [facility].  Her mother seemed fine at breakfast and was able to eat and went about her usual activities, but then she started complaining of pain in her left leg over the course the day.  The daughter felt like there is some swelling of the leg particularly around the knee and by the tonight the patient would cry out when trying to move her leg around.  EMS was called and brought her to the ED.  The patient is not able to provide much history but does confirm pain in the left leg, worse when she moves it or when we push on it.  She does not seem to have any other complaints and her daughter says she is otherwise at baseline without any other complaints or concerns.  She has not been urinating more frequently than usual and the urine has not had a foul smell.  She has not been vomiting or complaining of chest pain or abdominal pain.        Past Medical History:  Diagnosis Date  . Arthritis   . Asthma   . Diabetes mellitus  without complication (HCC)   . Hypertension   . Morbid obesity [facility](HCC)     Patient Active Problem List   Diagnosis Date Noted  . Fracture of distal femur (HCC) 06/05/2020  . Accidental fall from bed, initial encounter 06/05/2020  . Preoperative clearance 06/05/2020  . Pulmonary hypertension (HCC) 07/21/2017  . Aortic stenosis 07/21/2017  . Bilateral hearing loss 01/24/2015  . Hypertension, benign 04/30/2010  . Type II diabetes mellitus (HCC) 04/30/2010    Past Surgical History:  Procedure Laterality Date  . CATARACT EXTRACTION, BILATERAL      Prior to Admission medications   Medication Sig Start Date End Date Taking? Authorizing Provider  amLODipine (NORVASC) 10 MG tablet Take by mouth. 05/01/17  Yes [provider]  aspirin 81 MG EC tablet Take by mouth. 05/15/09  Yes [provider]  insulin lispro (HUMALOG) 100 UNIT/ML injection Sliding scale insulin # of units = (BS-150/50) TID AC 05/09/16  Yes [provider]  insulin NPH Human (NOVOLIN N) 100 UNIT/ML injection Inject 15 Units into the skin daily before breakfast. 05/09/16  Yes [provider]  Multiple Vitamins tablet Take by mouth. 02/11/11  Yes [provider]  ascorbic acid (VITAMIN C) 1000 MG tablet Take by mouth.    [provider]  Cholecalciferol  125 MCG (5000 UT) TABS Take 1 tablet by mouth daily.    [provider]  Dietary Management Product (ALGESIS) TABS Take by mouth.    [provider]  ipratropium-albuterol (DUONEB) 0.5-2.5 (3) MG/3ML SOLN SMARTSIG:1 Ampule(s) Via Nebulizer Every 6 Hours PRN 03/25/20   [provider]  metFORMIN (GLUCOPHAGE) 1000 MG tablet Take by mouth in the morning and at bedtime. 05/08/20   [provider]  metoprolol tartrate (LOPRESSOR) 25 MG tablet Take 12.5 mg by mouth 2 (two) times daily. 05/08/20   [provider]  Omega-3 Fatty Acids (FISH OIL) 1200 MG CAPS Take by mouth.    [provider]  pravastatin (PRAVACHOL) 40 MG tablet Take 40 mg by mouth at bedtime. 05/08/20   [provider]    Allergies Amoxicillin-pot clavulanate and Hydrochlorothiazide  History reviewed. No pertinent family history.  Social History Social History   Tobacco Use  . Smoking status: Never Smoker  . Smokeless tobacco: Never Used    Review of Systems Level 5 caveat: The patient is elderly and very hard of hearing.  The daughter has been caring for her for years and provides most of the history.  ____________________________________________   PHYSICAL EXAM:  VITAL SIGNS: ED Triage Vitals  Enc Vitals Group     BP 06/05/20 0026 123/82     Pulse Rate 06/05/20 0026 92     Resp 06/05/20 0026 18     Temp 06/05/20 0026 98.4 F (36.9 C)     Temp Source 06/05/20 0026 Oral     SpO2 06/05/20 0026 99 %     Weight 06/05/20 0022 86.2 kg (190 lb)     Height 06/05/20 0022 1.575 m (5\' 2" )     Head Circumference --      Peak Flow --      Pain Score --      Pain Loc --      Pain Edu? --      Excl. in GC? --     Constitutional: Awake and alert, responds well to her daughter, very hard of hearing and has a difficult time providing any history. Eyes: Conjunctivae are normal.  Head: Atraumatic. Nose: No congestion/rhinnorhea. Mouth/Throat: Patient is wearing a mask. Neck: No stridor.  No meningeal signs.   Cardiovascular: Normal rate, regular rhythm. Good peripheral circulation. Respiratory: Normal respiratory effort.  No retractions. Gastrointestinal: Obese.  Soft and nontender. No distention.  Musculoskeletal: Somewhat difficult to appreciate differences between lower extremities given her obesity.  Her daughter said that her left upper leg is always a little bit bigger than the right and that seems to be true tonight as well, most notable in the area of the distal femur just above the knee which does appear larger on the left than the right.  The patient has tenderness to palpation of  the distal femur/knee but it is not possible to localize a particular area.  I am able to passively range her hips and her knees although she does cry out when I am doing so with the left knee but not the hip.  Her lower legs and feet appear normal and are nontender.  The left thigh and calf are soft and easily compressible with no ecchymosis, induration to suggest hematoma, and no evidence of compartment syndrome. Neurologic:  Normal speech and language. No gross focal neurologic deficits are appreciated.  Skin:  Skin is warm, dry and intact.   ____________________________________________   LABS (all labs ordered are listed,  but only abnormal results are displayed)  Labs Reviewed  BASIC METABOLIC PANEL - Abnormal; Notable for the following components:      Result Value   Sodium 131 (*)    Glucose, Bld 174 (*)    BUN 24 (*)    All other components within normal limits  CBC WITH DIFFERENTIAL/PLATELET - Abnormal; Notable for the following components:   Hemoglobin 9.6 (*)    HCT 31.1 (*)    MCV 65.6 (*)    MCH 20.3 (*)    RDW 15.6 (*)    All other components within normal limits  SARS CORONAVIRUS 2 BY RT PCR (HOSPITAL ORDER, PERFORMED IN [facility])  PROTIME-INR  TYPE AND SCREEN   ____________________________________________  EKG  ED ECG REPORT I, Loleta Rose, the attending physician, personally viewed and interpreted this ECG.  Date: 06/05/2020 EKG Time: 5:50 AM Rate: 80 Rhythm: normal sinus rhythm QRS Axis: normal Intervals: normal ST/T Wave abnormalities: normal Narrative Interpretation: no evidence of acute ischemia  ____________________________________________  RADIOLOGY I, Loleta Rose, personally viewed and evaluated these images (plain radiographs) as part of my medical decision making, as well as reviewing the written report by the radiologist.  ED MD interpretation: No acute abnormality identified on x-rays of the hip/pelvis nor of the  knee.  Official radiology report(s): DG Chest Port 1 View  Result Date: 06/05/2020 CLINICAL DATA:  Preop evaluation for upcoming orthopedic surgery, known left EXAM: PORTABLE CHEST 1 VIEW COMPARISON:  None. FINDINGS: Cardiac shadow is mildly enlarged. The lungs are well aerated bilaterally. No focal infiltrate or effusion is seen. No bony abnormality is noted. IMPRESSION: No acute abnormality seen. Electronically Signed   By: Alcide Clever M.D.   On: 06/05/2020 03:24   DG Knee Complete 4 Views Left  Result Date: 06/05/2020 CLINICAL DATA:  Pain and swelling after fall EXAM: LEFT KNEE - COMPLETE 4+ VIEW COMPARISON:  None. FINDINGS: There is a nondisplaced spiral type fracture of the distal left femur. No dislocation at the knee. IMPRESSION: Nondisplaced spiral type fracture of the distal left femur. Electronically Signed   By: Deatra Robinson M.D.   On: 06/05/2020 02:33   DG Hip Unilat W or Wo Pelvis 2-3 Views Left  Result Date: 06/05/2020 CLINICAL DATA:  Recent fall with left hip pain, initial encounter EXAM: DG HIP (WITH OR WITHOUT PELVIS) 3V LEFT COMPARISON:  None. FINDINGS: Pelvic ring is intact. Mild degenerative changes of the hip joints are noted. No acute fracture or dislocation is seen. No soft tissue abnormality is noted. IMPRESSION: No acute abnormality noted. Electronically Signed   By: Alcide Clever M.D.   On: 06/05/2020 00:39    ____________________________________________   PROCEDURES   Procedure(s) performed (including Critical Care):  Procedures   ____________________________________________   INITIAL IMPRESSION / MDM / ASSESSMENT AND PLAN / ED COURSE  As part of my medical decision making, I reviewed the following data within the electronic MEDICAL RECORD NUMBER History obtained from family, Nursing notes reviewed and incorporated, Labs reviewed , Old chart reviewed, Radiograph reviewed  and Notes from prior ED visits, discussed case by phone with the orthopedic surgeon (Dr.  Joice Lofts), discussed case by phone with the hospitalist (Dr. Para March).   Differential diagnosis includes, but is not limited to, musculoskeletal strain, contusion, knee effusion, tibial plateau fracture, distal femur fracture, hip or pelvic fracture, infection or electrolyte abnormality.  The patient has been in her usual state of health and the daughter has no concerns other than possible  injury from her fall.  I am getting some basic lab work because the patient has not been here before and having a baseline I believe will be important.  I also want to check for any evidence of anemia that might suggest an acute or subacute blood loss into her left leg but I think that she most likely has a strain and possibly a knee effusion but no thigh hematoma.  Blood work from [employer] from about 3 years ago shows a hemoglobin of 11 and an essentially normal metabolic panel but no results more recent than that are available.  Her daughter reports that the use doctors making house calls for home health.  I personally reviewed the patient's imaging and agree with the radiologist's interpretation that there is no evidence of fracture or dislocation on the x-rays of the hip/pelvis nor of the knee.  Her daughter is comfortable taking her home.  We provided Tylenol 1000 mg by mouth.  I do not believe there is any evidence of an acute or emergent medical condition other than musculoskeletal strain/sprain and she should be appropriate for discharge and outpatient follow-up.       Clinical Course as of 06/05/20 0334  Caleen Essex Jun 05, 2020  0154 I discussed the case by phone with Dr. Joice Lofts who agrees she would benefit from surgical intervention.  I ordered the rest of the lab work for surgical preparation, preop EKG, preop chest x-ray.  Covid swab pending (to our PCR in case she goes for surgery before the send out test is back).  Infusion of normal saline 125 mL/h for maintenance fluids, n.p.o. status.  Morphine 4 mg IV as needed  x2 doses for severe pain.  I consulted the hospitalist for admission [CF]  0244 DG Knee Complete 4 Views Left Nondisplaced spiral distal femur fracture.  Updated patient's daughter and I have placed a call to Dr. Joice Lofts with orthopedics to discuss. [CF]  0330 Discussed case by phone with Dr. Para March with the hospitalist service and she will admit [CF]    Clinical Course User Index [CF] Loleta Rose, MD     ____________________________________________  FINAL CLINICAL IMPRESSION(S) / ED DIAGNOSES  Final diagnoses:  Closed nondisplaced fracture of distal epiphysis of left femur, initial encounter (HCC)  Fall, initial encounter     MEDICATIONS GIVEN DURING THIS VISIT:  Medications  ceFAZolin (ANCEF) IVPB 2g/100 mL premix (has no administration in time range)  0.9 %  sodium chloride infusion (has no administration in time range)  morphine 4 MG/ML injection 4 mg (has no administration in time range)  acetaminophen (TYLENOL) tablet 1,000 mg (1,000 mg Oral Given 06/05/20 0238)     ED Discharge Orders    None      *Please note:  Natalie Adams was evaluated in Emergency Department on 06/05/2020 for the symptoms described in the history of present illness. She was evaluated in the context of the global COVID-19 pandemic, which necessitated consideration that the patient might be at risk for infection with the SARS-CoV-2 virus that causes COVID-19. Institutional protocols and algorithms that pertain to the evaluation of patients at risk for COVID-19 are in a state of rapid change based on information released by regulatory bodies including the CDC and federal and state organizations. These policies and algorithms were followed during the patient's care in the ED.  Some ED evaluations and interventions may be delayed as a result of limited staffing during and after the pandemic.*  Note:  This document was prepared using  [employer] and may include unintentional dictation  errors.   Loleta Rose, MD 06/05/20 3220    Loleta Rose, MD 06/05/20 2542    Loleta Rose, MD 06/05/20 [phone]

## 2020-06-05 NOTE — Progress Notes (Signed)
Initial Nutrition Assessment  DOCUMENTATION CODES:   Obesity unspecified  INTERVENTION:   -Once diet is advanced, add:   -Glucerna Shake po TID, each supplement provides 220 kcal and 10 grams of protein -MVI with minerals daily  NUTRITION DIAGNOSIS:   Increased nutrient needs related to post-op healing as evidenced by estimated needs.  GOAL:   Patient will meet greater than or equal to 90% of their needs  MONITOR:   PO intake,Supplement acceptance,Diet advancement,Labs,Weight trends,Skin,I & O's  REASON FOR ASSESSMENT:   Consult Assessment of nutrition requirement/status,Hip fracture protocol  ASSESSMENT:   87 year old female with history of HTN, DM, bilateral hearing loss, mild aortic stenosis and pulmonary hypertension, with distal femur fracture following an unwitnessed fall several hours earlier.  Pt admitted with lt distal femur fracture.   Reviewed I/O's: +100 ml x 24 hours  Per orthopedics notes, plan for ORIF today. Pt is currently NPO for procedure.   Pt unavailable at time of visit. Unable to obtain further nutrition-related history or complete nutrition-focused physical exam at this time.   No wt history available to assess changes at this time.   Pt with increased nutritional needs for post-operative healing and would benefit from addition of oral nutrition supplements.   Medications reviewed and include 0.9% sodium chloride infusion @ 75 ml/hr.   Lab Results  Component Value Date   HGBA1C 7.0 (H) 06/05/2020   PTA DM medications are SSI insulin lispro TID, 15 units insulin NPH daily, and 1000 mg metformin BID .   Labs reviewed:CBGS: 147-150 (inpatient orders for glycemic control are 0-15 units insulin aspart every 4 hours).    Diet Order:   Diet Order            Diet NPO time specified  Diet effective now                 EDUCATION NEEDS:   No education needs have been identified at this time  Skin:  Skin Assessment: Reviewed RN  Assessment  Last BM:  Unknown  Height:   Ht Readings from Last 1 Encounters:  06/05/20 5\' 2"  (1.575 m)    Weight:   Wt Readings from Last 1 Encounters:  06/05/20 86.2 kg    Ideal Body Weight:  50 kg  BMI:  Body mass index is 34.76 kg/m.  Estimated Nutritional Needs:   Kcal:  1550-1750  Protein:  85-100 grams  Fluid:  > 1.5 L    08/03/20, RD, LDN, CDCES Registered Dietitian II Certified Diabetes Care and Education Specialist Please refer to [facility] for RD and/or RD on-call/weekend/after hours pager

## 2020-06-05 NOTE — ED Notes (Signed)
Pt resting quietly, resp are even and unlabored, no distress noted, daughter at bedside, daughter curious of the time frame of how long the patient will be in the ER and how much time she may have to go home and get the patient some things and return, daughter reassured that this RN would attempt to find out a time frame and let her know

## 2020-06-05 NOTE — Anesthesia Preprocedure Evaluation (Addendum)
Anesthesia Evaluation  Patient identified by MRN, date of birth, ID band Patient awake and Patient confused    Reviewed: Allergy & Precautions, H&P , NPO status , Patient's Chart, lab work & pertinent test results  Airway Mallampati: III  TM Distance: >3 FB Neck ROM: limited    Dental  (+) Missing   Pulmonary asthma ,    Pulmonary exam normal        Cardiovascular hypertension, Normal cardiovascular exam+ Valvular Problems/Murmurs AS      Neuro/Psych TIAnegative psych ROS   GI/Hepatic negative GI ROS, Neg liver ROS,   Endo/Other  diabetes, Type 2, Insulin Dependent  Renal/GU      Musculoskeletal  (+) Arthritis ,   Abdominal   Peds  Hematology negative hematology ROS (+)   Anesthesia Other Findings Past Medical History: No date: Arthritis No date: Asthma No date: Diabetes mellitus without complication (HCC) No date: Hypertension No date: Morbid obesity (HCC)  Past Surgical History: No date: CATARACT EXTRACTION, BILATERAL  BMI    Body Mass Index: 34.75 kg/m      Reproductive/Obstetrics negative OB ROS                            Anesthesia Physical Anesthesia Plan  ASA: III  Anesthesia Plan: Spinal   Post-op Pain Management:    Induction:   PONV Risk Score and Plan:   Airway Management Planned: Natural Airway and Nasal Cannula  Additional Equipment:   Intra-op Plan:   Post-operative Plan:   Informed Consent: I have reviewed the patients History and Physical, chart, labs and discussed the procedure including the risks, benefits and alternatives for the proposed anesthesia with the patient or authorized representative who has indicated his/her understanding and acceptance.     Dental Advisory Given  Plan Discussed with: Anesthesiologist, CRNA and Surgeon  Anesthesia Plan Comments: (History and phone consent from the patients daughter Markia Kyer at   [phone]   Daughter reports no bleeding problems and no anticoagulant use.  Plan for spinal with backup GA  Daughter consented for risks of anesthesia including but not limited to:  - adverse reactions to medications - damage to eyes, teeth, lips or other oral mucosa - nerve damage due to positioning  - risk of bleeding, infection and or nerve damage from spinal that could lead to paralysis - risk of headache or failed spinal - damage to teeth, lips or other oral mucosa - sore throat or hoarseness - damage to heart, brain, nerves, lungs, other parts of body or loss of life  She voiced understanding.)       Anesthesia Quick Evaluation

## 2020-06-05 NOTE — Consult Note (Signed)
ORTHOPAEDIC CONSULTATION  REQUESTING PHYSICIAN: Darlin Priestly, MD  Chief Complaint:   Left thigh pain.  History of Present Illness: Natalie Adams is a 87 y.o. female with a history of diabetes, hypertension, asthma, and obesity who lives with her daughter.  The patient is minimally ambulatory and usually gets around in a wheelchair, according to the daughter.  She is able to stand for transfers with assistance.  Apparently, the patient tried to get out of her wheelchair on her own and fell, injuring her left thigh region.  She was brought to the emergency room where x-rays of her pelvis and hip are unremarkable, but x-rays of her left femur demonstrated a nondisplaced fracture through the distal portion of the femur.  The patient and her daughter deny any associated injuries.  Apparently, she did not strike her head or lose consciousness.  The patient is not able to say whether she had any lightheadedness, dizziness, chest pain, shortness of breath, or other symptoms which may have precipitated her fall.  Past Medical History:  Diagnosis Date  . Arthritis   . Asthma   . Diabetes mellitus without complication (HCC)   . Hypertension   . Morbid obesity (HCC)    Past Surgical History:  Procedure Laterality Date  . CATARACT EXTRACTION, BILATERAL     Social History   Socioeconomic History  . Marital status: Divorced    Spouse name: Not on file  . Number of children: Not on file  . Years of education: Not on file  . Highest education level: Not on file  Occupational History  . Not on file  Tobacco Use  . Smoking status: Never Smoker  . Smokeless tobacco: Never Used  Substance and Sexual Activity  . Alcohol use: Not on file  . Drug use: Not on file  . Sexual activity: Not on file  Other Topics Concern  . Not on file  Social History Narrative  . Not on file   Social Determinants of Health   Financial Resource Strain: Not  on file  Food Insecurity: Not on file  Transportation Needs: Not on file  Physical Activity: Not on file  Stress: Not on file  Social Connections: Not on file   History reviewed. No pertinent family history. Allergies  Allergen Reactions  . Amoxicillin-Pot Clavulanate Diarrhea  . Hydrochlorothiazide Other (See Comments)    "she got shaky and fell" "she got shaky and fell"    Prior to Admission medications   Medication Sig Start Date End Date Taking? Authorizing Provider  amLODipine (NORVASC) 10 MG tablet Take by mouth. 05/01/17  Yes [provider]  aspirin 81 MG EC tablet Take by mouth. 05/15/09  Yes [provider]  insulin lispro (HUMALOG) 100 UNIT/ML injection Sliding scale insulin # of units = (BS-150/50) TID AC 05/09/16  Yes [provider]  insulin NPH Human (NOVOLIN N) 100 UNIT/ML injection Inject 15 Units into the skin daily before breakfast. 05/09/16  Yes [provider]  Multiple Vitamins tablet Take by mouth. 02/11/11  Yes [provider]  ascorbic acid (VITAMIN C) 1000 MG tablet Take by mouth.    [provider]  Cholecalciferol 125 MCG (5000 UT) TABS Take 1 tablet by mouth daily.    [provider]  Dietary Management Product (ALGESIS) TABS Take by mouth.    [provider]  ipratropium-albuterol (DUONEB) 0.5-2.5 (3) MG/3ML SOLN SMARTSIG:1 Ampule(s) Via Nebulizer Every 6 Hours PRN 03/25/20   [provider]  metFORMIN (GLUCOPHAGE) 1000 MG tablet  Take by mouth in the morning and at bedtime. 05/08/20   [provider]  metoprolol tartrate (LOPRESSOR) 25 MG tablet Take 12.5 mg by mouth 2 (two) times daily. 05/08/20   [provider]  Omega-3 Fatty Acids (FISH OIL) 1200 MG CAPS Take by mouth.    [provider]  pravastatin (PRAVACHOL) 40 MG tablet Take 40 mg by mouth at bedtime. 05/08/20   [provider]   DG Chest Port 1 View  Result Date: 06/05/2020 CLINICAL DATA:   Preop evaluation for upcoming orthopedic surgery, known left EXAM: PORTABLE CHEST 1 VIEW COMPARISON:  None. FINDINGS: Cardiac shadow is mildly enlarged. The lungs are well aerated bilaterally. No focal infiltrate or effusion is seen. No bony abnormality is noted. IMPRESSION: No acute abnormality seen. Electronically Signed   By: Alcide Clever M.D.   On: 06/05/2020 03:24   DG Knee Complete 4 Views Left  Result Date: 06/05/2020 CLINICAL DATA:  Pain and swelling after fall EXAM: LEFT KNEE - COMPLETE 4+ VIEW COMPARISON:  None. FINDINGS: There is a nondisplaced spiral type fracture of the distal left femur. No dislocation at the knee. IMPRESSION: Nondisplaced spiral type fracture of the distal left femur. Electronically Signed   By: Deatra Robinson M.D.   On: 06/05/2020 02:33   DG Hip Unilat W or Wo Pelvis 2-3 Views Left  Result Date: 06/05/2020 CLINICAL DATA:  Recent fall with left hip pain, initial encounter EXAM: DG HIP (WITH OR WITHOUT PELVIS) 3V LEFT COMPARISON:  None. FINDINGS: Pelvic ring is intact. Mild degenerative changes of the hip joints are noted. No acute fracture or dislocation is seen. No soft tissue abnormality is noted. IMPRESSION: No acute abnormality noted. Electronically Signed   By: Alcide Clever M.D.   On: 06/05/2020 00:39    Positive ROS: All other systems have been reviewed and were otherwise negative with the exception of those mentioned in the HPI and as above.  Physical Exam: General: Drowsy, but in no acute distress Psychiatric:  Patient is not competent for consent, but exhibits normal mood and affect   Cardiovascular:  No pedal edema Respiratory:  No wheezing, non-labored breathing GI:  Abdomen is soft and non-tender Skin:  No lesions in the area of chief complaint Neurologic:  Sensation intact distally Lymphatic:  No axillary or cervical lymphadenopathy  Orthopedic Exam:  Orthopedic examination is limited to the left lower extremity.  The left lower extremity appears to  be aligned symmetrically with the right with no shortening or rotational deformities identified.  There is mild-moderate swelling over the left thigh region, but no other skin abnormalities are identified.  She has moderate tenderness to palpation in the distal thigh region, and more severe pain with any attempted active or passive motion of the left lower extremity, including gentle logrolling.  She is neurovascularly intact to her left foot and lower leg.  X-rays:  Recent x-rays of the left femur are available for review and have been reviewed by myself.  These films demonstrate a nondisplaced distal femur/supracondylar femur fracture.  No significant degenerative changes of the left knee are noted.  No lytic lesions or other bony abnormalities are identified.  Assessment: Closed nondisplaced left supracondylar femur fracture.  Plan: The treatment options, including both surgical and nonsurgical choices, have been discussed in detail with the patient and her daughter who is at the bedside and is her power of attorney. Both the patient and her daughter would like to proceed with surgical intervention to include an open  reduction and internal fixation of the left distal femur fracture. The risks (including bleeding, infection, nerve and/or blood vessel injury, persistent or recurrent pain, malunion and/or nonunion, stiffness of the knee, need for further surgery, blood clots, strokes, heart attacks or arrhythmias, pneumonia, etc.) and benefits of the surgical procedure were discussed. The patient's daughter states her understanding and agrees to proceed.  She agrees to a blood transfusion if necessary. A formal written consent has been obtained.  Thank you for asking me to participate in the care of this most pleasant yet unfortunate woman.  I will be happy to follow her with you.   Maryagnes Amos, MD  Beeper #:  [phone]  06/05/2020 7:58 AM

## 2020-06-05 NOTE — Plan of Care (Signed)

## 2020-06-05 NOTE — Progress Notes (Signed)
PROGRESS NOTE    Natalie Adams  NTZ:[id number] DOB: [birth year] DOA: 06/05/2020 PCP: [facility]    Assessment & Plan:   Principal Problem:   Fracture of distal femur (HCC) Active Problems:   Hypertension, benign   Type II diabetes mellitus (HCC)   Pulmonary hypertension (HCC)   Bilateral hearing loss   Aortic stenosis   Accidental fall from bed, initial encounter   Preoperative clearance   Natalie Adams is a 87 y.o. female with medical history significant for HTN, DM, bilateral hearing loss, mild aortic stenosis and pulmonary hypertension, brought to the emergency room for evaluation of left leg pain following a fall onto her knees about 20 hours prior resulting in left leg pain.     Fracture of distal left femur (HCC) -Unwitnessed fall onto knees with development of pain and swelling just above the left knee several hours later -X-ray left knee showing nondisplaced spiral fracture of the distal left femur Plan: --OPEN REDUCTION INTERNAL FIXATION (ORIF) DISTAL FEMUR FRACTURE today --pain control per ortho --PT eval    Hypertension, benign -BP varied widely.   --resume home metop --hold home amlodipine for now    Type II diabetes mellitus (HCC) -SSI    Pulmonary hypertension (HCC) -No acute concerns at this time    Bilateral hearing loss -Increase nursing assistance for communication    Aortic stenosis -No acute concerns at this time   HLD --cont home statin  Obesity, BMI 34.7  Urinary retention --post-op, pt found to have 799 ml in bladder Plan: --Bladder scan q6h.  I/O cath if post-void >400 ml, and if 2nd I/O cath required, then place a Foley.   DVT prophylaxis: Lovenox SQ Code Status: Full code  Family Communication:  Status is: inpatient Dispo:   The patient is from: home Anticipated d/c is to: SNF rehab Anticipated d/c date is: 2-3 days Patient currently is not medically ready to d/c due to: just post-op   Subjective and  Interval History:  Pt went for surgery today.  Post-op, pt was awake, but repeatedly saying "I want to go to heaven to be with Jesus."  Did not answer any questions.   Objective: Vitals:   06/05/20 1645 06/05/20 1714 06/05/20 1744 06/05/20 1846  BP: (!) 167/82 (!) 173/108 (!) 150/110 (!) 128/93  Pulse: 89 97 92 (!) 110  Resp: (!) 25 18 20 18   Temp: (!) 97.3 F (36.3 C) 97.6 F (36.4 C) 97.8 F (36.6 C) 97.8 F (36.6 C)  TempSrc:      SpO2: 98% 100% 97% 100%  Weight:      Height:        Intake/Output Summary (Last 24 hours) at 06/05/2020 1945 Last data filed at 06/05/2020 1507 Gross per 24 hour  Intake 600 ml  Output 50 ml  Net 550 ml   Filed Weights   06/05/20 0022 06/05/20 1245  Weight: 86.2 kg 86.2 kg    Examination:   Constitutional: NAD, alert, repeating the same sentence over and over HEENT: conjunctivae and lids normal, EOMI CV: No cyanosis.   RESP: normal respiratory effort, on RA Extremities: No effusions, edema in BLE SKIN: warm, dry Neuro: II - XII grossly intact.     Data Reviewed: I have personally reviewed following labs and imaging studies  CBC: Recent Labs  Lab 06/05/20 0222  WBC 9.2  NEUTROABS 5.9  HGB 9.6*  HCT 31.1*  MCV 65.6*  PLT 205   Basic Metabolic Panel: Recent Labs  Lab 06/05/20 0222  NA 131*  K 4.2  CL 99  CO2 24  GLUCOSE 174*  BUN 24*  CREATININE 0.78  CALCIUM 9.5   GFR: Estimated Creatinine Clearance: 49.5 mL/min (by C-G formula based on SCr of 0.78 mg/dL). Liver Function Tests: No results for input(s): AST, ALT, ALKPHOS, BILITOT, PROT, ALBUMIN in the last 168 hours. No results for input(s): LIPASE, AMYLASE in the last 168 hours. No results for input(s): AMMONIA in the last 168 hours. Coagulation Profile: Recent Labs  Lab 06/05/20 0300  INR 1.0   Cardiac Enzymes: No results for input(s): CKTOTAL, CKMB, CKMBINDEX, TROPONINI in the last 168 hours. BNP (last 3 results) No results for input(s): PROBNP in the last  8760 hours. HbA1C: Recent Labs    06/05/20 0222  HGBA1C 7.0*   CBG: Recent Labs  Lab 06/05/20 0516 06/05/20 1210 06/05/20 1243 06/05/20 1536 06/05/20 1720  GLUCAP 150* 149* 147* 141* 186*   Lipid Profile: No results for input(s): CHOL, HDL, LDLCALC, TRIG, CHOLHDL, LDLDIRECT in the last 72 hours. Thyroid Function Tests: No results for input(s): TSH, T4TOTAL, FREET4, T3FREE, THYROIDAB in the last 72 hours. Anemia Panel: No results for input(s): VITAMINB12, FOLATE, FERRITIN, TIBC, IRON, RETICCTPCT in the last 72 hours. Sepsis Labs: No results for input(s): PROCALCITON, LATICACIDVEN in the last 168 hours.  Recent Results (from the past 240 hour(s))  SARS Coronavirus 2 by RT PCR (hospital order, performed in [facility] hospital lab) Nasopharyngeal Nasopharyngeal Swab     Status: None   Collection Time: 06/05/20  3:23 AM   Specimen: Nasopharyngeal Swab  Result Value Ref Range Status   SARS Coronavirus 2 NEGATIVE NEGATIVE Final    Comment: (NOTE) SARS-CoV-2 target nucleic acids are NOT DETECTED.  The SARS-CoV-2 RNA is generally detectable in upper and lower respiratory specimens during the acute phase of infection. The lowest concentration of SARS-CoV-2 viral copies this assay can detect is 250 copies / mL. A negative result does not preclude SARS-CoV-2 infection and should not be used as the sole basis for treatment or other patient management decisions.  A negative result may occur with improper specimen collection / handling, submission of specimen other than nasopharyngeal swab, presence of viral mutation(s) within the areas targeted by this assay, and inadequate number of viral copies (<250 copies / mL). A negative result must be combined with clinical observations, patient history, and epidemiological information.  Fact Sheet for Patients:   BoilerBrush.com.cy  Fact Sheet for Healthcare  Providers: https://pope.com/  This test is not yet approved or  cleared by the [place] FDA and has been authorized for detection and/or diagnosis of SARS-CoV-2 by FDA under an Emergency Use Authorization (EUA).  This EUA will remain in effect (meaning this test can be used) for the duration of the COVID-19 declaration under Section 564(b)(1) of the Act, 21 U.S.C. section 360bbb-3(b)(1), unless the authorization is terminated or revoked sooner.  Performed at [facility], [address], [place], [place] [address]       Radiology Studies: DG Chest [place] 1 View  Result Date: 06/05/2020 CLINICAL DATA:  Preop evaluation for upcoming orthopedic surgery, known left EXAM: PORTABLE CHEST 1 VIEW COMPARISON:  None. FINDINGS: Cardiac shadow is mildly enlarged. The lungs are well aerated bilaterally. No focal infiltrate or effusion is seen. No bony abnormality is noted. IMPRESSION: No acute abnormality seen. Electronically Signed   By: Alcide Clever M.D.   On: 06/05/2020 03:24   DG Knee Complete 4 Views Left  Result Date: 06/05/2020 CLINICAL DATA:  Pain and swelling after fall EXAM: LEFT KNEE - COMPLETE 4+ VIEW COMPARISON:  None. FINDINGS: There is a nondisplaced spiral type fracture of the distal left femur. No dislocation at the knee. IMPRESSION: Nondisplaced spiral type fracture of the distal left femur. Electronically Signed   By: Deatra Robinson M.D.   On: 06/05/2020 02:33   DG C-Arm 1-60 Min  Result Date: 06/05/2020 CLINICAL DATA:  Fracture. Additional history provided: Open reduction internal fixation (ORIF) distal femur fracture. Fluoroscopy time 1 minutes 4 seconds (9.1 mGy). EXAM: LEFT FEMUR 2 VIEWS; DG C-ARM 1-60 MIN COMPARISON:  Radiographs of the left knee 06/05/2020. FINDINGS: Six intraoperative fluoroscopic images of the left femur are submitted. The images demonstrate interval lateral plate and screw fixation of the mid to distal femur, traversing a  known acute fracture. There is near anatomic alignment. IMPRESSION: Six intraoperative fluoroscopic images of the left femur from left femoral ORIF, as described. Electronically Signed   By: Jackey Loge DO   On: 06/05/2020 15:30   DG Hip Unilat W or Wo Pelvis 2-3 Views Left  Result Date: 06/05/2020 CLINICAL DATA:  Recent fall with left hip pain, initial encounter EXAM: DG HIP (WITH OR WITHOUT PELVIS) 3V LEFT COMPARISON:  None. FINDINGS: Pelvic ring is intact. Mild degenerative changes of the hip joints are noted. No acute fracture or dislocation is seen. No soft tissue abnormality is noted. IMPRESSION: No acute abnormality noted. Electronically Signed   By: Alcide Clever M.D.   On: 06/05/2020 00:39   DG FEMUR MIN 2 VIEWS LEFT  Result Date: 06/05/2020 CLINICAL DATA:  Fracture. Additional history provided: Open reduction internal fixation (ORIF) distal femur fracture. Fluoroscopy time 1 minutes 4 seconds (9.1 mGy). EXAM: LEFT FEMUR 2 VIEWS; DG C-ARM 1-60 MIN COMPARISON:  Radiographs of the left knee 06/05/2020. FINDINGS: Six intraoperative fluoroscopic images of the left femur are submitted. The images demonstrate interval lateral plate and screw fixation of the mid to distal femur, traversing a known acute fracture. There is near anatomic alignment. IMPRESSION: Six intraoperative fluoroscopic images of the left femur from left femoral ORIF, as described. Electronically Signed   By: Jackey Loge DO   On: 06/05/2020 15:30     Scheduled Meds: . acetaminophen  500 mg Oral Q6H  . docusate sodium  100 mg Oral BID  . [START ON 06/06/2020] enoxaparin (LOVENOX) injection  0.5 mg/kg Subcutaneous Q24H  . [START ON 06/06/2020] feeding supplement (GLUCERNA SHAKE)  237 mL Oral TID BM  . fentaNYL      . insulin aspart  0-15 Units Subcutaneous Q4H  . [START ON 06/06/2020] multivitamin with minerals  1 tablet Oral Daily   Continuous Infusions: . sodium chloride 75 mL/hr at 06/05/20 1252  .  ceFAZolin (ANCEF) IV 2 g  (06/05/20 1829)     LOS: 0 days     Darlin Priestly, MD [facility] If 7PM-7AM, please contact night-coverage 06/05/2020, 7:45 PM

## 2020-06-05 NOTE — Progress Notes (Signed)
MD was at bedside and she advised [place] that she would put in medications for patient, bc patient is demented and agitated while she was at bedside

## 2020-06-05 NOTE — ED Triage Notes (Signed)
EMS brings pt in from home; pt lifted onto stretcher; daughter reports pt get OOB unassisted 345am yesterday and fell onto her knees; st that she used hoyer lift to get her back up and tonight noted that she had swelling to her upper left thigh with pain to her hip; denies any other c/o or injuries; pt is very [facility]

## 2020-06-06 LAB — GLUCOSE, CAPILLARY
Glucose-Capillary: 110 mg/dL — ABNORMAL HIGH (ref 70–99)
Glucose-Capillary: 137 mg/dL — ABNORMAL HIGH (ref 70–99)
Glucose-Capillary: 232 mg/dL — ABNORMAL HIGH (ref 70–99)
Glucose-Capillary: 153 mg/dL — ABNORMAL HIGH (ref 70–99)
Glucose-Capillary: 192 mg/dL — ABNORMAL HIGH (ref 70–99)

## 2020-06-06 LAB — CBC
HCT: 27.8 % — ABNORMAL LOW (ref 36.0–46.0)
Hemoglobin: 8.7 g/dL — ABNORMAL LOW (ref 12.0–15.0)
MCH: 20.4 pg — ABNORMAL LOW (ref 26.0–34.0)
MCHC: 31.3 g/dL (ref 30.0–36.0)
MCV: 65.1 fL — ABNORMAL LOW (ref 80.0–100.0)
Platelets: 191 10*3/uL (ref 150–400)
RBC: 4.27 MIL/uL (ref 3.87–5.11)
RDW: 15.7 % — ABNORMAL HIGH (ref 11.5–15.5)
WBC: 9.8 10*3/uL (ref 4.0–10.5)
nRBC: 0.2 % (ref 0.0–0.2)

## 2020-06-06 LAB — BASIC METABOLIC PANEL
Anion gap: 10 (ref 5–15)
BUN: 13 mg/dL (ref 8–23)
CO2: 21 mmol/L — ABNORMAL LOW (ref 22–32)
Calcium: 8.9 mg/dL (ref 8.9–10.3)
Chloride: 100 mmol/L (ref 98–111)
Creatinine, Ser: 0.62 mg/dL (ref 0.44–1.00)
GFR, Estimated: >60 mL/min (ref >60)
Glucose, Bld: 223 mg/dL — ABNORMAL HIGH (ref 70–99)
Potassium: 4.1 mmol/L (ref 3.5–5.1)
Sodium: 131 mmol/L — ABNORMAL LOW (ref 135–145)

## 2020-06-06 MED ORDER — CHLORHEXIDINE GLUCONATE CLOTH 2 % EX PADS
6.0000 | MEDICATED_PAD | Freq: Every day | CUTANEOUS | Status: DC
  Administered 2020-06-07: 6 via TOPICAL

## 2020-06-06 NOTE — Progress Notes (Signed)
PT Cancellation Note  Patient Details Name: Natalie Adams MRN: [id number] DOB: 1930/04/06   Cancelled Treatment:    Reason Eval/Treat Not Completed: Other (comment) (Pt currently with pastoral care. Will check back later.)   Jadd Gasior A Larone Kliethermes 06/06/2020, 11:16 AM

## 2020-06-06 NOTE — Progress Notes (Signed)
PROGRESS NOTE    Natalie Adams  HGD:[id number] DOB: [birth year] DOA: 06/05/2020 PCP: [facility]    Assessment & Plan:   Principal Problem:   Fracture of distal femur (HCC) Active Problems:   Hypertension, benign   Type II diabetes mellitus (HCC)   Pulmonary hypertension (HCC)   Bilateral hearing loss   Aortic stenosis   Accidental fall from bed, initial encounter   Preoperative clearance   Natalie Adams is a 87 y.o. female with medical history significant for HTN, DM, bilateral hearing loss, mild aortic stenosis and pulmonary hypertension, brought to the emergency room for evaluation of left leg pain following a fall onto her knees about 20 hours prior resulting in left leg pain.     Fracture of distal left femur (HCC) --s/p OPEN REDUCTION INTERNAL FIXATION (ORIF) DISTAL FEMUR FRACTURE on 2/4 -Unwitnessed fall onto knees with development of pain and swelling just above the left knee several hours later -X-ray left knee showing nondisplaced spiral fracture of the distal left femur Plan: --pain control per ortho --PT eval  Confusion due to delirium --avoid too much sedatives --frequent orientation and keep day/night cycle.    Hypertension, benign -BP varied widely.   --cont home metop --hold home amlodipine for now    Type II diabetes mellitus (HCC) --SSI    Pulmonary hypertension (HCC) -No acute concerns at this time    Bilateral hearing loss -Increase nursing assistance for communication    Aortic stenosis -No acute concerns at this time   HLD --cont home statin  Obesity, BMI 34.7  Urinary retention --post-op, pt found to have 799 ml in bladder. Plan: --cont Foley for now   DVT prophylaxis: Lovenox SQ Code Status: Full code  Family Communication: daughter updated at bedside today Status is: inpatient Dispo:   The patient is from: home Anticipated d/c is to: SNF rehab Anticipated d/c date is: 1-2 days Patient currently is not  medically ready to d/c due to: pending PT eval   Subjective and Interval History:  Pt was not eating drinking, per nursing.  Daughter came and was able to feed pt some food.  Pt remained confused and lethargic.    Pt was found to have urinary retention.  Foley inserted.   Objective: Vitals:   06/06/20 0848 06/06/20 1109 06/06/20 1305 06/06/20 1620  BP: (!) 97/53 98/81 102/64 92/72  Pulse: (!) 106 (!) 118 (!) 104 (!) 109  Resp: 17 18 17 20   Temp: 99.2 F (37.3 C) 98.6 F (37 C) 98.2 F (36.8 C) 98.8 F (37.1 C)  TempSrc:    Oral  SpO2: 100% 99% 98% 99%  Weight:      Height:        Intake/Output Summary (Last 24 hours) at 06/06/2020 1658 Last data filed at 06/06/2020 1621 Gross per 24 hour  Intake 921.96 ml  Output 1250 ml  Net -328.04 ml   Filed Weights   06/05/20 0022 06/05/20 1245  Weight: 86.2 kg 86.2 kg    Examination:   Constitutional: NAD, lethargic, confused HEENT: kept eyes closed CV: No cyanosis.   RESP: normal respiratory effort, on RA SKIN: warm, dry Neuro: II - XII grossly intact.   Foley present   Data Reviewed: I have personally reviewed following labs and imaging studies  CBC: Recent Labs  Lab 06/05/20 0222 06/06/20 1033  WBC 9.2 9.8  NEUTROABS 5.9  --   HGB 9.6* 8.7*  HCT 31.1* 27.8*  MCV 65.6* 65.1*  PLT 205 191  Basic Metabolic Panel: Recent Labs  Lab 06/05/20 0222 06/06/20 1033  NA 131* 131*  K 4.2 4.1  CL 99 100  CO2 24 21*  GLUCOSE 174* 223*  BUN 24* 13  CREATININE 0.78 0.62  CALCIUM 9.5 8.9   GFR: Estimated Creatinine Clearance: 49.5 mL/min (by C-G formula based on SCr of 0.62 mg/dL). Liver Function Tests: No results for input(s): AST, ALT, ALKPHOS, BILITOT, PROT, ALBUMIN in the last 168 hours. No results for input(s): LIPASE, AMYLASE in the last 168 hours. No results for input(s): AMMONIA in the last 168 hours. Coagulation Profile: Recent Labs  Lab 06/05/20 0300  INR 1.0   Cardiac Enzymes: No results for  input(s): CKTOTAL, CKMB, CKMBINDEX, TROPONINI in the last 168 hours. BNP (last 3 results) No results for input(s): PROBNP in the last 8760 hours. HbA1C: Recent Labs    06/05/20 0222  HGBA1C 7.0*   CBG: Recent Labs  Lab 06/05/20 2318 06/06/20 0520 06/06/20 0749 06/06/20 1111 06/06/20 1618  GLUCAP 198* 110* 137* 232* 153*   Lipid Profile: No results for input(s): CHOL, HDL, LDLCALC, TRIG, CHOLHDL, LDLDIRECT in the last 72 hours. Thyroid Function Tests: No results for input(s): TSH, T4TOTAL, FREET4, T3FREE, THYROIDAB in the last 72 hours. Anemia Panel: No results for input(s): VITAMINB12, FOLATE, FERRITIN, TIBC, IRON, RETICCTPCT in the last 72 hours. Sepsis Labs: No results for input(s): PROCALCITON, LATICACIDVEN in the last 168 hours.  Recent Results (from the past 240 hour(s))  SARS Coronavirus 2 by RT PCR (hospital order, performed in [facility] hospital lab) Nasopharyngeal Nasopharyngeal Swab     Status: None   Collection Time: 06/05/20  3:23 AM   Specimen: Nasopharyngeal Swab  Result Value Ref Range Status   SARS Coronavirus 2 NEGATIVE NEGATIVE Final    Comment: (NOTE) SARS-CoV-2 target nucleic acids are NOT DETECTED.  The SARS-CoV-2 RNA is generally detectable in upper and lower respiratory specimens during the acute phase of infection. The lowest concentration of SARS-CoV-2 viral copies this assay can detect is 250 copies / mL. A negative result does not preclude SARS-CoV-2 infection and should not be used as the sole basis for treatment or other patient management decisions.  A negative result may occur with improper specimen collection / handling, submission of specimen other than nasopharyngeal swab, presence of viral mutation(s) within the areas targeted by this assay, and inadequate number of viral copies (<250 copies / mL). A negative result must be combined with clinical observations, patient history, and epidemiological information.  Fact Sheet for  Patients:   BoilerBrush.com.cy  Fact Sheet for Healthcare Providers: https://pope.com/  This test is not yet approved or  cleared by the [place] FDA and has been authorized for detection and/or diagnosis of SARS-CoV-2 by FDA under an Emergency Use Authorization (EUA).  This EUA will remain in effect (meaning this test can be used) for the duration of the COVID-19 declaration under Section 564(b)(1) of the Act, 21 U.S.C. section 360bbb-3(b)(1), unless the authorization is terminated or revoked sooner.  Performed at [facility], [address] [place], [place] [address]       Radiology Studies: DG Chest [place] 1 View  Result Date: 06/05/2020 CLINICAL DATA:  Preop evaluation for upcoming orthopedic surgery, known left EXAM: PORTABLE CHEST 1 VIEW COMPARISON:  None. FINDINGS: Cardiac shadow is mildly enlarged. The lungs are well aerated bilaterally. No focal infiltrate or effusion is seen. No bony abnormality is noted. IMPRESSION: No acute abnormality seen. Electronically Signed   By: Alcide Clever  M.D.   On: 06/05/2020 03:24   DG Knee Complete 4 Views Left  Result Date: 06/05/2020 CLINICAL DATA:  Pain and swelling after fall EXAM: LEFT KNEE - COMPLETE 4+ VIEW COMPARISON:  None. FINDINGS: There is a nondisplaced spiral type fracture of the distal left femur. No dislocation at the knee. IMPRESSION: Nondisplaced spiral type fracture of the distal left femur. Electronically Signed   By: Deatra Robinson M.D.   On: 06/05/2020 02:33   DG C-Arm 1-60 Min  Result Date: 06/05/2020 CLINICAL DATA:  Fracture. Additional history provided: Open reduction internal fixation (ORIF) distal femur fracture. Fluoroscopy time 1 minutes 4 seconds (9.1 mGy). EXAM: LEFT FEMUR 2 VIEWS; DG C-ARM 1-60 MIN COMPARISON:  Radiographs of the left knee 06/05/2020. FINDINGS: Six intraoperative fluoroscopic images of the left femur are submitted. The images demonstrate  interval lateral plate and screw fixation of the mid to distal femur, traversing a known acute fracture. There is near anatomic alignment. IMPRESSION: Six intraoperative fluoroscopic images of the left femur from left femoral ORIF, as described. Electronically Signed   By: Jackey Loge DO   On: 06/05/2020 15:30   DG Hip Unilat W or Wo Pelvis 2-3 Views Left  Result Date: 06/05/2020 CLINICAL DATA:  Recent fall with left hip pain, initial encounter EXAM: DG HIP (WITH OR WITHOUT PELVIS) 3V LEFT COMPARISON:  None. FINDINGS: Pelvic ring is intact. Mild degenerative changes of the hip joints are noted. No acute fracture or dislocation is seen. No soft tissue abnormality is noted. IMPRESSION: No acute abnormality noted. Electronically Signed   By: Alcide Clever M.D.   On: 06/05/2020 00:39   DG FEMUR MIN 2 VIEWS LEFT  Result Date: 06/05/2020 CLINICAL DATA:  Fracture. Additional history provided: Open reduction internal fixation (ORIF) distal femur fracture. Fluoroscopy time 1 minutes 4 seconds (9.1 mGy). EXAM: LEFT FEMUR 2 VIEWS; DG C-ARM 1-60 MIN COMPARISON:  Radiographs of the left knee 06/05/2020. FINDINGS: Six intraoperative fluoroscopic images of the left femur are submitted. The images demonstrate interval lateral plate and screw fixation of the mid to distal femur, traversing a known acute fracture. There is near anatomic alignment. IMPRESSION: Six intraoperative fluoroscopic images of the left femur from left femoral ORIF, as described. Electronically Signed   By: Jackey Loge DO   On: 06/05/2020 15:30     Scheduled Meds:  acetaminophen  500 mg Oral Q6H   aspirin EC  81 mg Oral Daily   Chlorhexidine Gluconate Cloth  6 each Topical Daily   docusate sodium  100 mg Oral BID   enoxaparin (LOVENOX) injection  0.5 mg/kg Subcutaneous Q24H   feeding supplement (GLUCERNA SHAKE)  237 mL Oral TID BM   insulin aspart  0-15 Units Subcutaneous Q4H   metoprolol tartrate  12.5 mg Oral BID   multivitamin  with minerals  1 tablet Oral Daily   pravastatin  40 mg Oral QHS   Continuous Infusions:  sodium chloride 75 mL/hr at 06/06/20 1520     LOS: 1 day     Darlin Priestly, MD [facility] If 7PM-7AM, please contact night-coverage 06/06/2020, 4:58 PM

## 2020-06-06 NOTE — Progress Notes (Signed)
   06/06/20 0848  Assess: MEWS Score  Temp 99.2 F (37.3 C)  BP (!) 97/53  Pulse Rate (!) 106  Resp 17  Level of Consciousness Alert  SpO2 100 %  O2 Device Room Air  Assess: MEWS Score  MEWS Temp 0  MEWS Systolic 1  MEWS Pulse 1  MEWS RR 0  MEWS LOC 0  MEWS Score 2  MEWS Score Color Yellow  Assess: if the MEWS score is Yellow or Red  Were vital signs taken at a resting state? Yes  Focused Assessment No change from prior assessment  Early Detection of Sepsis Score *See Row Information* High  MEWS guidelines implemented *See Row Information* Yes  Treat  MEWS Interventions Escalated (See documentation below);Other (Comment) (MD Notified)  Pain Scale PAINAD  Pain Score 0  Take Vital Signs  Increase Vital Sign Frequency  Yellow: Q 2hr X 2 then Q 4hr X 2, if remains yellow, continue Q 4hrs  Escalate  MEWS: Escalate Yellow: discuss with charge nurse/RN and consider discussing with provider and RRT  Notify: Charge Nurse/RN  Name of Charge Nurse/RN Notified Marchelle Folks RN  Date Charge Nurse/RN Notified 06/06/20  Time Charge Nurse/RN Notified 0923  Notify: Provider  Provider Name/Title Dr. Fran Lowes  Date Provider Notified 06/06/20  Time Provider Notified [phone]  Notification Type Call  Notification Reason Other (Comment) (Low BP, HR 106)  Response Other (Comment) (LABS ordered)  Date of Provider Response 06/06/20  Time of Provider Response [phone]  Document  Patient Outcome Other (Comment) (stable)  Progress note created (see row info) Yes

## 2020-06-06 NOTE — Evaluation (Signed)
Physical Therapy Evaluation Patient Details Name: Natalie Adams MRN: [id number] DOB: Sep 06, 1929 Today's Date: 06/06/2020   History of Present Illness  Pt presenting d/t fall from w/c with nondisplaced L femur fx. She presents with a PMHx significant for arthritis, asthma, DM, HTN, and obesity. She is now s/p ORIF of L supracondylar femur fx and NWB of the affected limb.  Clinical Impression  Natalie Adams presents with daughter who is primary cargiver at bedside who is able to report pt's PLOF. Prior to admission the pt was A&Ox4, she was able to participate in rolling, stand pivot transfers to bedside commode, and some positioning in w/c. Per the pt's daughter she and the patient utilize the hoyer lift for most transfers. At baseline the pt was an active participant in all mobility. During this session the pt presented with decreased alertness and slow psychomotor responses. She was unable to sustain eye opening and required total A for bed mobility. At this time the pt is present well below her baseline. It is expected that with continued therapies as the patients cognition and level of arousal improves that she may be able to progress to d/c home with 24/7 family care and HHPT, however at this time she is unsafe for home. PT will continue to follow to make recommendations as appropriate.     Follow Up Recommendations Supervision/Assistance - 24 hour;SNF (vs home with 24/7 and HHPT)    Equipment Recommendations       Recommendations for Other Services       Precautions / Restrictions Precautions Precautions: Fall Restrictions Weight Bearing Restrictions: Yes LLE Weight Bearing: Non weight bearing      Mobility  Bed Mobility Overal bed mobility: Needs Assistance Bed Mobility: Supine to Sit     Supine to sit: +2 for physical assistance;Total assist     General bed mobility comments: Pt with impaired cognition; Total A to achieve EOB.    Transfers                 General  transfer comment: Unable to safely complete transfers d/t cognition; per daughter pt's utilizes hoyer for transfers at baseline.  Ambulation/Gait                Stairs            Wheelchair Mobility    Modified Rankin (Stroke Patients Only)       Balance Overall balance assessment: Needs assistance Sitting-balance support: Single extremity supported Sitting balance-Leahy Scale: Zero Sitting balance - Comments: Pt intermittently demonstrates ability to maintain static sitting balance with unilateral UE support; righting reactions noted Postural control: Posterior lean;Left lateral lean                                   Pertinent Vitals/Pain Pain Assessment: Faces Faces Pain Scale: Hurts little more Pain Location: LLE when mobilizing only. Pain Intervention(s): Repositioned;Monitored during session    Home Living Family/patient expects to be discharged to:: Private residence Living Arrangements: Children Available Help at Discharge: Family;Available 24 hours/day Type of Home: House Home Access: Ramped entrance       Home Equipment: Shower seat;Bedside commode;Wheelchair - [employer] - 2 wheels (Hoyer lift and [employer]) Additional Comments: Per daughter the pt was total A for all transfers via hoyer lift, pt has bed pan she utilizes if unable to get on bed pan.    Prior Function Level of Independence: Needs assistance  Gait / Transfers Assistance Needed: Did not ambulate prior to admission; Total A for transfers  ADL's / Homemaking Assistance Needed: Mod I for bathing (bason at sink), Assistance for dressing        Hand Dominance        Extremity/Trunk Assessment   Upper Extremity Assessment Upper Extremity Assessment: LUE deficits/detail;RUE deficits/detail RUE Deficits / Details: Limited active shoulder flexion and abduction LUE Deficits / Details: Limited passive shoulder flexion; daughter reports past sx hx limiting  mobility.    Lower Extremity Assessment Lower Extremity Assessment: LLE deficits/detail;RLE deficits/detail RLE Deficits / Details: No active movement noted in LE. LLE Deficits / Details: LLE locked in extension with KI brace. LLE: Unable to fully assess due to immobilization    Cervical / Trunk Assessment Cervical / Trunk Assessment: Kyphotic  Communication   Communication: HOH  Cognition Arousal/Alertness: Lethargic (Stuporous/obtunded) Behavior During Therapy: Flat affect Overall Cognitive Status: Impaired/Different from baseline Area of Impairment: Orientation;Awareness;Attention;Following commands;Safety/judgement;Problem solving                 Orientation Level: Disoriented to;Place;Time;Situation Current Attention Level: Divided   Following Commands: Follows one step commands inconsistently     Problem Solving: Requires tactile cues;Requires verbal cues General Comments: Pt following 3/10 one step verbal commands. She constantly repeats "father,son, and holy ghost" throughout session.      General Comments      Exercises     Assessment/Plan    PT Assessment Patient needs continued PT services  PT Problem List Decreased strength;Decreased mobility;Decreased activity tolerance;Decreased cognition;Decreased balance       PT Treatment Interventions Patient/family education;Gait training;Functional mobility training;Therapeutic exercise;Therapeutic activities    PT Goals (Current goals can be found in the Care Plan section)  Acute Rehab PT Goals Patient Stated Goal: Daughter states goal is to take the patient home PT Goal Formulation: With family Time For Goal Achievement: 06/20/20 Potential to Achieve Goals: Fair    Frequency 7X/week   Barriers to discharge   Current cognitive status.    Co-evaluation               AM-PAC PT "6 Clicks" Mobility  Outcome Measure Help needed turning from your back to your side while in a flat bed without  using bedrails?: Total Help needed moving from lying on your back to sitting on the side of a flat bed without using bedrails?: Total Help needed moving to and from a bed to a chair (including a wheelchair)?: Total Help needed standing up from a chair using your arms (e.g., wheelchair or bedside chair)?: Total Help needed to walk in hospital room?: Total Help needed climbing 3-5 steps with a railing? : Total 6 Click Score: 6    End of Session Equipment Utilized During Treatment: Left knee immobilizer Activity Tolerance: Patient limited by lethargy Patient left: in bed;with family/visitor present;with bed alarm set;with SCD's reapplied;with call bell/phone within reach   PT Visit Diagnosis: Muscle weakness (generalized) (M62.81);Other abnormalities of gait and mobility (R26.89)    Time: 3419-3790 PT Time Calculation (min) (ACUTE ONLY): 44 min  Please be advised that is eval was completed in 2 different sessions (1214-1238;1442-1502) for a total of 44 min.   Charges:   PT Evaluation $PT Eval High Complexity: 1 High PT Treatments $Therapeutic Activity: 23-37 mins        3:34 PM, 06/06/20 Jeraldean Wechter A. Mordecai Maes PT, DPT Physical Therapist - [facility]   Zandrea Kenealy A Zyion Leidner 06/06/2020, 3:29 PM

## 2020-06-06 NOTE — Anesthesia Postprocedure Evaluation (Signed)
Anesthesia Post Note  Patient: Natalie Adams  Procedure(s) Performed: OPEN REDUCTION INTERNAL FIXATION (ORIF) DISTAL FEMUR FRACTURE (Left )  Patient location during evaluation: Nursing Unit Anesthesia Type: Spinal Level of consciousness: oriented and awake and alert Pain management: pain level controlled Vital Signs Assessment: post-procedure vital signs reviewed and stable Respiratory status: spontaneous breathing, respiratory function stable and patient connected to nasal cannula oxygen Cardiovascular status: blood pressure returned to baseline and stable Postop Assessment: no headache, no backache, no apparent nausea or vomiting and spinal receding Anesthetic complications: no   No complications documented.   Last Vitals:  Vitals:   06/05/20 2308 06/06/20 0517  BP: (!) 124/100 100/78  Pulse: (!) 108 99  Resp: 18 17  Temp: 36.6 C 36.9 C  SpO2: 98% 98%    Last Pain:  Vitals:   06/05/20 2233  TempSrc:   PainSc: 3                  Corinda Gubler

## 2020-06-06 NOTE — Progress Notes (Addendum)
   Subjective: 1 Day Post-Op Procedure(s) (LRB): OPEN REDUCTION INTERNAL FIXATION (ORIF) DISTAL FEMUR FRACTURE (Left) Patient is a difficult historian.  Nurses noting she is not taking anything by mouth.  Patient with elevated heart rate and lower blood pressures.  She is receiving IV fluids. Patient is alert but unable to communicate well. We will start physical therapy today.  Plan is to go to skilled nursing facility at discharge  Objective: Vital signs in last 24 hours: Temp:  [96.7 F (35.9 C)-99.4 F (37.4 C)] 99.2 F (37.3 C) (02/05 0848) Pulse Rate:  [75-113] 106 (02/05 0848) Resp:  [15-25] 17 (02/05 0848) BP: (97-173)/(53-110) 97/53 (02/05 0848) SpO2:  [97 %-100 %] 100 % (02/05 0848) Weight:  [86.2 kg] 86.2 kg (02/04 1245)  Intake/Output from previous day: 02/04 0701 - 02/05 0700 In: 800 [I.V.:500; IV Piggyback:300] Out: 650 [Urine:600; Blood:50] Intake/Output this shift: No intake/output data recorded.  Recent Labs    06/05/20 0222  HGB 9.6*   Recent Labs    06/05/20 0222  WBC 9.2  RBC 4.74  HCT 31.1*  PLT 205   Recent Labs    06/05/20 0222  NA 131*  K 4.2  CL 99  CO2 24  BUN 24*  CREATININE 0.78  GLUCOSE 174*  CALCIUM 9.5   Recent Labs    06/05/20 0300  INR 1.0    EXAM General - Patient is Alert and Unable to verbally communicate well.  She does not appear to be in any distress. Extremity - No cellulitis present Compartment soft Knee immobilizer intact.  No edema. Dressing - dressing C/D/I Able to passively move the ankle with no discomfort  Past Medical History:  Diagnosis Date  . Arthritis   . Asthma   . Diabetes mellitus without complication (HCC)   . Hypertension   . Morbid obesity (HCC)     Assessment/Plan:   1 Day Post-Op Procedure(s) (LRB): OPEN REDUCTION INTERNAL FIXATION (ORIF) DISTAL FEMUR FRACTURE (Left) Principal Problem:   Fracture of distal femur (HCC) Active Problems:   Hypertension, benign   Type II diabetes  mellitus (HCC)   Pulmonary hypertension (HCC)   Bilateral hearing loss   Aortic stenosis   Accidental fall from bed, initial encounter   Preoperative clearance  Estimated body mass index is 34.76 kg/m as calculated from the following:   Height as of this encounter: 5\' 2"  (1.575 m).   Weight as of this encounter: 86.2 kg. Up with therapy, nonweightbearing left lower extremity Continue with knee immobilizer. CBC and BMP pending. Patient with elevated heart rate and soft BP. Patient has been unable to take anything po including her beta blocker. Continue with IV fluids. IM following, appreciate their input. Pain seems to be well controlled Care management to assist with discharge to SNF   DVT Prophylaxis - Lovenox, Foot Pumps and TED hose Nonweightbearing left lower extremity   T. , PA-C [facility] 06/06/2020, 9:05 AM

## 2020-06-07 LAB — CBC
HCT: 26.5 % — ABNORMAL LOW (ref 36.0–46.0)
Hemoglobin: 8.3 g/dL — ABNORMAL LOW (ref 12.0–15.0)
MCH: 20.2 pg — ABNORMAL LOW (ref 26.0–34.0)
MCHC: 31.3 g/dL (ref 30.0–36.0)
MCV: 64.5 fL — ABNORMAL LOW (ref 80.0–100.0)
Platelets: 204 10*3/uL (ref 150–400)
RBC: 4.11 MIL/uL (ref 3.87–5.11)
RDW: 15.7 % — ABNORMAL HIGH (ref 11.5–15.5)
WBC: 8 10*3/uL (ref 4.0–10.5)
nRBC: 0.3 % — ABNORMAL HIGH (ref 0.0–0.2)

## 2020-06-07 LAB — GLUCOSE, CAPILLARY
Glucose-Capillary: 104 mg/dL — ABNORMAL HIGH (ref 70–99)
Glucose-Capillary: 119 mg/dL — ABNORMAL HIGH (ref 70–99)
Glucose-Capillary: 129 mg/dL — ABNORMAL HIGH (ref 70–99)
Glucose-Capillary: 146 mg/dL — ABNORMAL HIGH (ref 70–99)
Glucose-Capillary: 150 mg/dL — ABNORMAL HIGH (ref 70–99)
Glucose-Capillary: 167 mg/dL — ABNORMAL HIGH (ref 70–99)

## 2020-06-07 LAB — BASIC METABOLIC PANEL
Anion gap: 10 (ref 5–15)
BUN: 12 mg/dL (ref 8–23)
CO2: 24 mmol/L (ref 22–32)
Calcium: 8.8 mg/dL — ABNORMAL LOW (ref 8.9–10.3)
Chloride: 101 mmol/L (ref 98–111)
Creatinine, Ser: 0.7 mg/dL (ref 0.44–1.00)
GFR, Estimated: >60 mL/min (ref >60)
Glucose, Bld: 122 mg/dL — ABNORMAL HIGH (ref 70–99)
Potassium: 3.5 mmol/L (ref 3.5–5.1)
Sodium: 135 mmol/L (ref 135–145)

## 2020-06-07 LAB — IRON AND TIBC
Iron: 11 ug/dL — ABNORMAL LOW (ref 28–170)
Saturation Ratios: 4 % — ABNORMAL LOW (ref 10.4–31.8)
TIBC: 274 ug/dL (ref 250–450)
UIBC: 263 ug/dL

## 2020-06-07 LAB — FOLATE: Folate: 10.7 ng/mL (ref >5.9)

## 2020-06-07 LAB — VITAMIN B12: Vitamin B-12: 388 pg/mL (ref 180–914)

## 2020-06-07 LAB — MAGNESIUM: Magnesium: 1.5 mg/dL — ABNORMAL LOW (ref 1.7–2.4)

## 2020-06-07 MED ORDER — MAGNESIUM SULFATE 2 GM/50ML IV SOLN
2.0000 g | Freq: Once | INTRAVENOUS | Status: AC
  Administered 2020-06-07: 2 g via INTRAVENOUS
  Filled 2020-06-07: qty 50

## 2020-06-07 MED ORDER — ACETAMINOPHEN 325 MG PO TABS: 325.0000 mg | ORAL_TABLET | Freq: Four times a day (QID) | ORAL | Status: DC | PRN

## 2020-06-07 MED ORDER — ENOXAPARIN SODIUM 40 MG/0.4ML ~~LOC~~ SOLN: 40.0000 mg | SUBCUTANEOUS | 0 refills | Status: DC

## 2020-06-07 MED ORDER — HYDROCODONE-ACETAMINOPHEN 5-325 MG PO TABS: 1.0000 | ORAL_TABLET | Freq: Four times a day (QID) | ORAL | 0 refills | Status: DC | PRN

## 2020-06-07 NOTE — Progress Notes (Signed)
   Subjective: 2 Days Post-Op Procedure(s) (LRB): OPEN REDUCTION INTERNAL FIXATION (ORIF) DISTAL FEMUR FRACTURE (Left) Patient more alert today but confused.  She is a difficult historian.  Blood pressures trending up.  Heart rate slightly improved.  She is currently eating breakfast. Patient is alert but unable to communicate well. We will continue with physical therapy today.  Plan is to go to skilled nursing facility at discharge  Objective: Vital signs in last 24 hours: Temp:  [98.2 F (36.8 C)-98.9 F (37.2 C)] 98.7 F (37.1 C) (02/06 0745) Pulse Rate:  [103-118] 103 (02/06 0745) Resp:  [16-20] 16 (02/06 0745) BP: (92-123)/(53-94) 114/53 (02/06 0745) SpO2:  [97 %-99 %] 98 % (02/06 0745)  Intake/Output from previous day: 02/05 0701 - 02/06 0700 In: 722 [P.O.:120; I.V.:602] Out: 1050 [Urine:1050] Intake/Output this shift: Total I/O In: 1217.8 [I.V.:1217.8] Out: -   Recent Labs    06/05/20 0222 06/06/20 1033 06/07/20 0748  HGB 9.6* 8.7* 8.3*   Recent Labs    06/06/20 1033 06/07/20 0748  WBC 9.8 8.0  RBC 4.27 4.11  HCT 27.8* 26.5*  PLT 191 204   Recent Labs    06/06/20 1033 06/07/20 0748  NA 131* 135  K 4.1 3.5  CL 100 101  CO2 21* 24  BUN 13 12  CREATININE 0.62 0.70  GLUCOSE 223* 122*  CALCIUM 8.9 8.8*   Recent Labs    06/05/20 0300  INR 1.0    EXAM General - Patient is Alert and Unable to verbally communicate well.  She does not appear to be in any distress. Extremity - No cellulitis present Compartment soft Knee immobilizer intact.  No edema. Dressing - dressing C/D/I Able to passively move the ankle with no discomfort  Past Medical History:  Diagnosis Date  . Arthritis   . Asthma   . Diabetes mellitus without complication (HCC)   . Hypertension   . Morbid obesity (HCC)     Assessment/Plan:   2 Days Post-Op Procedure(s) (LRB): OPEN REDUCTION INTERNAL FIXATION (ORIF) DISTAL FEMUR FRACTURE (Left) Principal Problem:   Fracture of  distal femur (HCC) Active Problems:   Hypertension, benign   Type II diabetes mellitus (HCC)   Pulmonary hypertension (HCC)   Bilateral hearing loss   Aortic stenosis   Accidental fall from bed, initial encounter   Preoperative clearance  Estimated body mass index is 34.76 kg/m as calculated from the following:   Height as of this encounter: 5\' 2"  (1.575 m).   Weight as of this encounter: 86.2 kg. Up with therapy, nonweightbearing left lower extremity Continue with knee immobilizer. Acute post op blood loss anemia -hemoglobin 8.3.  Start iron supplement recheck hemoglobin in the morning Vital signs stable Pain seems to be well controlled despite only taking Tylenol Care management to assist with discharge to SNF   Patient will need follow-up with [facility] orthopedics in 2 weeks Keep left lower extremity incision site clean and dry at all times. Continue with knee immobilizer Lovenox 40 mg subcu daily x14 days at discharge   DVT Prophylaxis - Lovenox, Foot Pumps and TED hose Nonweightbearing left lower extremity   [facility], PA-C [facility] 06/07/2020, 10:03 AM

## 2020-06-07 NOTE — Progress Notes (Signed)
Physical Therapy Treatment Patient Details Name: Natalie Adams MRN: [id number] DOB: 09-Jul-1929 Today's Date: 06/07/2020    History of Present Illness Pt presenting d/t fall from w/c with nondisplaced L femur fx. She presents with a PMHx significant for arthritis, asthma, DM, HTN, and obesity. She is now s/p ORIF of L supracondylar femur fx and NWB of the affected limb.    PT Comments    Pt awake.  Unable to understand speech.  BLE PROM/AAROM to tolerance.  Pt very guarded with movements during session.  Overall poor tolerance ov BLE movements.  She does assist minimally with RLE for a few reps but then stops. Sitting/OOB deferred at this time.  Hoyer lift appropriate for OOB transfers as she is unable to assist, maintain sitting balance of WB status at this time.    Follow Up Recommendations  Supervision/Assistance - 24 hour;SNF (vs home with 24/7 and HHPT)     Equipment Recommendations       Recommendations for Other Services       Precautions / Restrictions Precautions Precautions: Fall Restrictions Weight Bearing Restrictions: Yes LLE Weight Bearing: Non weight bearing    Mobility  Bed Mobility                  Transfers                    Ambulation/Gait                 Stairs             Wheelchair Mobility    Modified Rankin (Stroke Patients Only)       Balance                                            Cognition Arousal/Alertness: Awake/alert Behavior During Therapy: Flat affect Overall Cognitive Status: Difficult to assess                                 General Comments: speech mumbled and unable to understand pt      Exercises Other Exercises Other Exercises: BLE ex PROM - very gaurded and limted ROM BLE allowed    General Comments        Pertinent Vitals/Pain Pain Assessment: Faces Faces Pain Scale: Hurts even more Pain Location: LLE when mobilizing only. Pain Descriptors  / Indicators: Grimacing;Guarding Pain Intervention(s): Limited activity within patient's tolerance;Monitored during session    Home Living                      Prior Function            PT Goals (current goals can now be found in the care plan section) Progress towards PT goals: Not progressing toward goals - comment    Frequency    7X/week      PT Plan      Co-evaluation              AM-PAC PT "6 Clicks" Mobility   Outcome Measure  Help needed turning from your back to your side while in a flat bed without using bedrails?: Total Help needed moving from lying on your back to sitting on the side of a flat bed without using bedrails?: Total Help needed moving to and from a  bed to a chair (including a wheelchair)?: Total Help needed standing up from a chair using your arms (e.g., wheelchair or bedside chair)?: Total Help needed to walk in hospital room?: Total Help needed climbing 3-5 steps with a railing? : Total 6 Click Score: 6    End of Session Equipment Utilized During Treatment: Left knee immobilizer Activity Tolerance: Patient limited by lethargy Patient left: in bed;with family/visitor present;with bed alarm set;with SCD's reapplied;with call bell/phone within reach   PT Visit Diagnosis: Muscle weakness (generalized) (M62.81);Other abnormalities of gait and mobility (R26.89)     Time: 6803-2122 PT Time Calculation (min) (ACUTE ONLY): 8 min  Charges:  $Therapeutic Exercise: 8-22 mins                    Danielle Dess, PTA 06/07/20, 9:01 AM

## 2020-06-07 NOTE — Progress Notes (Addendum)
PROGRESS NOTE    Natalie Adams  UMP:[id number] DOB: 1929/05/06 DOA: 06/05/2020 PCP: [facility]    Assessment & Plan:   Principal Problem:   Fracture of distal femur (HCC) Active Problems:   Hypertension, benign   Type II diabetes mellitus (HCC)   Pulmonary hypertension (HCC)   Bilateral hearing loss   Aortic stenosis   Accidental fall from bed, initial encounter   Preoperative clearance   Natalie Adams is a 87 y.o. female with medical history significant for HTN, DM, bilateral hearing loss, mild aortic stenosis and pulmonary hypertension, brought to the emergency room for evaluation of left leg pain following a fall onto her knees about 20 hours prior resulting in left leg pain.     Fracture of distal left femur (HCC) --s/p OPEN REDUCTION INTERNAL FIXATION (ORIF) DISTAL FEMUR FRACTURE on 2/4 -Unwitnessed fall onto knees with development of pain and swelling just above the left knee several hours later -X-ray left knee showing nondisplaced spiral fracture of the distal left femur Plan: Patient will need follow-up with [facility]ics in 2 weeks Keep left lower extremity incision site clean and dry at all times. Continue with knee immobilizer Lovenox 40 mg subcu daily x14 days at discharge DVT Prophylaxis - Lovenox, Foot Pumps and TED hose Nonweightbearing left lower extremity --PT rec SNF rehab, but daughter will take pt home.  Confusion due to delirium --avoid too much sedatives --frequent orientation and keep day/night cycle.    Hypertension, benign -BP varied widely.   --cont home metop --hold home amlodipine for now    Type II diabetes mellitus (HCC) --A1c 7.0 --SSI    Pulmonary hypertension (HCC) -No acute concerns at this time    Bilateral hearing loss -Increase nursing assistance for communication    Aortic stenosis -No acute concerns at this time   HLD --cont home statin  Obesity, BMI 34.7  Urinary retention --post-op, pt found  to have 799 ml in bladder.  Foley inserted. Plan: --d/c Foley today and do bladder scan.  Iron def anemia, microcytic --Hgb in 8's.  Anemia workup showed severe iron def Plan: --will discharge on oral iron supplement (daughter self pays, so avoid ordering more expensive IV iron).    DVT prophylaxis: Lovenox SQ Code Status: Full code  Family Communication: daughter updated at bedside today Status is: inpatient Dispo:   The patient is from: home Anticipated d/c is to: daughter wants to take pt home Anticipated d/c date is: tomorrow Patient currently is medically ready to d/c.   Subjective and Interval History:  Pt was noted to be eating more on her own this morning, but still very somnolent and confused.   Objective: Vitals:   06/07/20 0402 06/07/20 0745 06/07/20 1138 06/07/20 1502  BP: (!) 123/94 (!) 114/53 (!) 115/59 108/63  Pulse: (!) 105 (!) 103 (!) 101 (!) 103  Resp: 17 16 16 20   Temp: 98.4 F (36.9 C) 98.7 F (37.1 C) 98 F (36.7 C) 99 F (37.2 C)  TempSrc:  Oral Axillary Oral  SpO2: 97% 98% 100% 97%  Weight:      Height:        Intake/Output Summary (Last 24 hours) at 06/07/2020 1652 Last data filed at 06/07/2020 1615 Gross per 24 hour  Intake 1507.83 ml  Output 800 ml  Net 707.83 ml   Filed Weights   06/05/20 0022 06/05/20 1245  Weight: 86.2 kg 86.2 kg    Examination:   Constitutional: NAD, somnolent, confused but followed simple commands HEENT:  conjunctivae and lids normal, EOMI CV: RRR, 3+ systolic murmur at right upper sternal boarder.  No cyanosis.   RESP: normal respiratory effort, on RA Extremities: mild swelling in BLE.  Left leg in brace. SKIN: warm, dry Foley present   Data Reviewed: I have personally reviewed following labs and imaging studies  CBC: Recent Labs  Lab 06/05/20 0222 06/06/20 1033 06/07/20 0748  WBC 9.2 9.8 8.0  NEUTROABS 5.9  --   --   HGB 9.6* 8.7* 8.3*  HCT 31.1* 27.8* 26.5*  MCV 65.6* 65.1* 64.5*  PLT 205 191  204   Basic Metabolic Panel: Recent Labs  Lab 06/05/20 0222 06/06/20 1033 06/07/20 0748  NA 131* 131* 135  K 4.2 4.1 3.5  CL 99 100 101  CO2 24 21* 24  GLUCOSE 174* 223* 122*  BUN 24* 13 12  CREATININE 0.78 0.62 0.70  CALCIUM 9.5 8.9 8.8*  MG  --   --  1.5*   GFR: Estimated Creatinine Clearance: 49.5 mL/min (by C-G formula based on SCr of 0.7 mg/dL). Liver Function Tests: No results for input(s): AST, ALT, ALKPHOS, BILITOT, PROT, ALBUMIN in the last 168 hours. No results for input(s): LIPASE, AMYLASE in the last 168 hours. No results for input(s): AMMONIA in the last 168 hours. Coagulation Profile: Recent Labs  Lab 06/05/20 0300  INR 1.0   Cardiac Enzymes: No results for input(s): CKTOTAL, CKMB, CKMBINDEX, TROPONINI in the last 168 hours. BNP (last 3 results) No results for input(s): PROBNP in the last 8760 hours. HbA1C: Recent Labs    06/05/20 0222  HGBA1C 7.0*   CBG: Recent Labs  Lab 06/07/20 0013 06/07/20 0404 06/07/20 0746 06/07/20 1145 06/07/20 1620  GLUCAP 104* 146* 119* 167* 150*   Lipid Profile: No results for input(s): CHOL, HDL, LDLCALC, TRIG, CHOLHDL, LDLDIRECT in the last 72 hours. Thyroid Function Tests: No results for input(s): TSH, T4TOTAL, FREET4, T3FREE, THYROIDAB in the last 72 hours. Anemia Panel: Recent Labs    06/07/20 0748  VITAMINB12 388  FOLATE 10.7  TIBC 274  IRON 11*   Sepsis Labs: No results for input(s): PROCALCITON, LATICACIDVEN in the last 168 hours.  Recent Results (from the past 240 hour(s))  SARS Coronavirus 2 by RT PCR (hospital order, performed in [facility] hospital lab) Nasopharyngeal Nasopharyngeal Swab     Status: None   Collection Time: 06/05/20  3:23 AM   Specimen: Nasopharyngeal Swab  Result Value Ref Range Status   SARS Coronavirus 2 NEGATIVE NEGATIVE Final    Comment: (NOTE) SARS-CoV-2 target nucleic acids are NOT DETECTED.  The SARS-CoV-2 RNA is generally detectable in upper and lower respiratory  specimens during the acute phase of infection. The lowest concentration of SARS-CoV-2 viral copies this assay can detect is 250 copies / mL. A negative result does not preclude SARS-CoV-2 infection and should not be used as the sole basis for treatment or other patient management decisions.  A negative result may occur with improper specimen collection / handling, submission of specimen other than nasopharyngeal swab, presence of viral mutation(s) within the areas targeted by this assay, and inadequate number of viral copies (<250 copies / mL). A negative result must be combined with clinical observations, patient history, and epidemiological information.  Fact Sheet for Patients:   BoilerBrush.com.cy  Fact Sheet for Healthcare Providers: https://pope.com/  This test is not yet approved or  cleared by the [place] FDA and has been authorized for detection and/or diagnosis of SARS-CoV-2 by FDA under an Emergency  Use Authorization (EUA).  This EUA will remain in effect (meaning this test can be used) for the duration of the COVID-19 declaration under Section 564(b)(1) of the Act, 21 U.S.C. section 360bbb-3(b)(1), unless the authorization is terminated or revoked sooner.  Performed at [facility], [address], [place], [place] [address]       Radiology Studies: No results found.   Scheduled Meds: . aspirin EC  81 mg Oral Daily  . Chlorhexidine Gluconate Cloth  6 each Topical Daily  . docusate sodium  100 mg Oral BID  . enoxaparin (LOVENOX) injection  0.5 mg/kg Subcutaneous Q24H  . feeding supplement (GLUCERNA SHAKE)  237 mL Oral TID BM  . insulin aspart  0-15 Units Subcutaneous Q4H  . metoprolol tartrate  12.5 mg Oral BID  . multivitamin with minerals  1 tablet Oral Daily  . pravastatin  40 mg Oral QHS   Continuous Infusions:    LOS: 2 days     Darlin Priestly, MD [facility] If 7PM-7AM, please  contact night-coverage 06/07/2020, 4:52 PM

## 2020-06-08 ENCOUNTER — Encounter: Payer: Self-pay | Admitting: Surgery

## 2020-06-08 LAB — GLUCOSE, CAPILLARY
Glucose-Capillary: 123 mg/dL — ABNORMAL HIGH (ref 70–99)
Glucose-Capillary: 160 mg/dL — ABNORMAL HIGH (ref 70–99)
Glucose-Capillary: 199 mg/dL — ABNORMAL HIGH (ref 70–99)
Glucose-Capillary: 91 mg/dL (ref 70–99)
Glucose-Capillary: 95 mg/dL (ref 70–99)

## 2020-06-08 LAB — BASIC METABOLIC PANEL
Anion gap: 10 (ref 5–15)
BUN: 15 mg/dL (ref 8–23)
CO2: 23 mmol/L (ref 22–32)
Calcium: 8.7 mg/dL — ABNORMAL LOW (ref 8.9–10.3)
Chloride: 103 mmol/L (ref 98–111)
Creatinine, Ser: 0.65 mg/dL (ref 0.44–1.00)
GFR, Estimated: >60 mL/min (ref >60)
Glucose, Bld: 127 mg/dL — ABNORMAL HIGH (ref 70–99)
Potassium: 3.7 mmol/L (ref 3.5–5.1)
Sodium: 136 mmol/L (ref 135–145)

## 2020-06-08 LAB — CBC
HCT: 24.5 % — ABNORMAL LOW (ref 36.0–46.0)
Hemoglobin: 7.7 g/dL — ABNORMAL LOW (ref 12.0–15.0)
MCH: 20.3 pg — ABNORMAL LOW (ref 26.0–34.0)
MCHC: 31.4 g/dL (ref 30.0–36.0)
MCV: 64.5 fL — ABNORMAL LOW (ref 80.0–100.0)
Platelets: 224 10*3/uL (ref 150–400)
RBC: 3.8 MIL/uL — ABNORMAL LOW (ref 3.87–5.11)
RDW: 15.2 % (ref 11.5–15.5)
WBC: 7.5 10*3/uL (ref 4.0–10.5)
nRBC: 0.3 % — ABNORMAL HIGH (ref 0.0–0.2)

## 2020-06-08 LAB — MAGNESIUM: Magnesium: 2 mg/dL (ref 1.7–2.4)

## 2020-06-08 NOTE — NC FL2 (Signed)
[place] MEDICAID FL2 LEVEL OF CARE SCREENING TOOL     IDENTIFICATION  Patient Name: Natalie Adams Birthdate: Nov 10, 1929 Sex: female Admission Date (Current Location): 06/05/2020  [place] and [place] Number:  Chiropodist and Address:  [facility], [address] [place], [place] [address]      Provider Number: [id number]  Attending Physician Name and Address:  Darlin Priestly, MD  Relative Name and Phone Number:  Kylia Grajales [phone]    Current Level of Care: Hospital Recommended Level of Care: Skilled Nursing Facility Prior Approval Number:    Date Approved/Denied:   PASRR Number: [id number]  Discharge Plan: SNF    Current Diagnoses: Patient Active Problem List   Diagnosis Date Noted  . Fracture of distal femur (HCC) 06/05/2020  . Accidental fall from bed, initial encounter 06/05/2020  . Preoperative clearance 06/05/2020  . Femur fracture, left (HCC) 06/05/2020  . Pulmonary hypertension (HCC) 07/21/2017  . Aortic stenosis 07/21/2017  . Bilateral hearing loss 01/24/2015  . Hypertension, benign 04/30/2010  . Type II diabetes mellitus (HCC) 04/30/2010    Orientation RESPIRATION BLADDER Height & Weight     Self  Normal External catheter Weight: 86.2 kg Height:  5\' 2"  (157.5 cm)  BEHAVIORAL SYMPTOMS/MOOD NEUROLOGICAL BOWEL NUTRITION STATUS      Incontinent Diet (Heart Healthy, Carb Modified)  AMBULATORY STATUS COMMUNICATION OF NEEDS Skin   Extensive Assist Verbally Surgical wounds                       Personal Care Assistance Level of Assistance  Bathing,Feeding,Dressing Bathing Assistance: Maximum assistance Feeding assistance: Limited assistance Dressing Assistance: Maximum assistance     Functional Limitations Info  Sight,Hearing,Speech   Hearing Info: Impaired      SPECIAL CARE FACTORS FREQUENCY  PT (By licensed PT),OT (By licensed OT)                    Contractures Contractures Info: Not  present    Additional Factors Info  Code Status,Allergies Code Status Info: DNR Allergies Info: Amoxicillin Clavulanate, Hydrochlorothiazide           Current Medications (06/08/2020):  This is the current hospital active medication list Current Facility-Administered Medications  Medication Dose Route Frequency Provider Last Rate Last Admin  . acetaminophen (TYLENOL) tablet 325-650 mg  325-650 mg Oral Q6H PRN Poggi, 08/06/2020, MD   650 mg at 06/07/20 1741  . aspirin EC tablet 81 mg  81 mg Oral Daily 08/05/20, MD   81 mg at 06/08/20 08/06/20  . bisacodyl (DULCOLAX) suppository 10 mg  10 mg Rectal Daily PRN Poggi, 1443, MD      . Chlorhexidine Gluconate Cloth 2 % PADS 6 each  6 each Topical Daily Excell Seltzer, MD   6 each at 06/07/20 1000  . diphenhydrAMINE (BENADRYL) 12.5 MG/5ML elixir 12.5-25 mg  12.5-25 mg Oral Q4H PRN Poggi, 06-01-1984, MD      . docusate sodium (COLACE) capsule 100 mg  100 mg Oral BID Excell Seltzer, MD   100 mg at 06/08/20 0906  . enoxaparin (LOVENOX) injection 42.5 mg  0.5 mg/kg Subcutaneous Q24H Poggi, 08/06/20, MD   42.5 mg at 06/08/20 0910  . feeding supplement (GLUCERNA SHAKE) (GLUCERNA SHAKE) liquid 237 mL  237 mL Oral TID BM 08/06/20, MD   237 mL at 06/07/20 2025  . HYDROcodone-acetaminophen (NORCO/VICODIN) 5-325 MG per tablet 1-2 tablet  1-2 tablet Oral Q6H PRN  Poggi, Excell Seltzer, MD      . insulin aspart (novoLOG) injection 0-15 Units  0-15 Units Subcutaneous Q4H Poggi, Excell Seltzer, MD   2 Units at 06/08/20 [phone]  . magnesium hydroxide (MILK OF MAGNESIA) suspension 30 mL  30 mL Oral Daily PRN Poggi, Excell Seltzer, MD      . metoCLOPramide (REGLAN) tablet 5-10 mg  5-10 mg Oral Q8H PRN Poggi, Excell Seltzer, MD       Or  . metoCLOPramide (REGLAN) injection 5-10 mg  5-10 mg Intravenous Q8H PRN Poggi, Excell Seltzer, MD      . metoprolol tartrate (LOPRESSOR) tablet 12.5 mg  12.5 mg Oral BID Darlin Priestly, MD   12.5 mg at 06/08/20 0903  . multivitamin with minerals tablet 1 tablet  1 tablet Oral Daily Darlin Priestly, MD    1 tablet at 06/08/20 [phone]  . ondansetron (ZOFRAN) tablet 4 mg  4 mg Oral Q6H PRN Poggi, Excell Seltzer, MD       Or  . ondansetron (ZOFRAN) injection 4 mg  4 mg Intravenous Q6H PRN Poggi, Excell Seltzer, MD      . pravastatin (PRAVACHOL) tablet 40 mg  40 mg Oral QHS Darlin Priestly, MD   40 mg at 06/07/20 2200  . QUEtiapine (SEROQUEL) tablet 50 mg  50 mg Oral QHS PRN Darlin Priestly, MD      . sodium phosphate (FLEET) 7-19 GM/118ML enema 1 enema  1 enema Rectal Once PRN Poggi, Excell Seltzer, MD      . traMADol Janean Sark) tablet 50 mg  50 mg Oral Q6H PRN Poggi, Excell Seltzer, MD         Discharge Medications: Please see discharge summary for a list of discharge medications.  Relevant Imaging Results:  Relevant Lab Results:   Additional Information SS# [id number]  Trenton Founds, RN

## 2020-06-08 NOTE — TOC Progression Note (Signed)
Transition of Care [facility]) - Progression Note    Patient Details  Name: Natalie Adams MRN: [id number] Date of Birth: Sep 29, 1929  Transition of Care [facility]) CM/SW [place], RN Phone Number: 06/08/2020, 12:58 PM  Clinical Narrative:   RNCM met with patient and daughter in room to further discuss discharge planning. Daughter is agreeable to take patient home and understands placement is very unlikely. Daughter would like patient to receive home health PT and reports patient will need EMS transport home. RNCM reached out to [place] with [facility] however they have no availability for charity at this time. RNCM reached out to [facility] with [facility] and he is checking to see if he can accept for charity home health.  RNCM will arrange transport once patient is ready.      Expected Discharge Plan: [place]    Expected Discharge Plan and Services Expected Discharge Plan: [place]                                               Social Determinants of Health (SDOH) Interventions    Readmission Risk Interventions No flowsheet data found.

## 2020-06-08 NOTE — Progress Notes (Signed)
Physical Therapy Treatment Patient Details Name: Natalie Adams MRN: [id number] DOB: 1929-07-14 Today's Date: 06/08/2020    History of Present Illness Pt presenting d/t fall from w/c with nondisplaced L femur fx. She presents with a PMHx significant for arthritis, asthma, DM, HTN, and obesity. She is now s/p ORIF of L supracondylar femur fx and NWB of the affected limb.    PT Comments    Daughter in room.  Participated in exercises as described below.  Discussed HEP and voiced understanding.  Session focused on rolling left/right with max a/dependant assist with Clinical research associate and daughter.  Care provided due to inc of urine while rolling.  Discussed plan with daughter and ways to make care easier. A hospital bed is recommended but daughter does not want to look into it at this time due to no insurance.  Suggested places to obtain one second hand if needed but will reach out to [facility] to see if she qualifies under charity funds.  She seems to have other equipment needed already at home.  While SNF is recommended she will likely return home with her daughter.   Follow Up Recommendations  Supervision/Assistance - 24 hour;SNF , HHPT     Equipment Recommendations       Recommendations for Other Services       Precautions / Restrictions Precautions Precautions: Fall Restrictions Weight Bearing Restrictions: Yes LLE Weight Bearing: Non weight bearing    Mobility  Bed Mobility Overal bed mobility: Needs Assistance Bed Mobility: Rolling Rolling: Total assist;+2 for physical assistance            Transfers                    Ambulation/Gait                 Stairs             Wheelchair Mobility    Modified Rankin (Stroke Patients Only)       Balance                                            Cognition Arousal/Alertness: Awake/alert Behavior During Therapy: Flat affect Overall Cognitive Status: Difficult to assess                                         Exercises Other Exercises Other Exercises: BLE ex PROM - very gaurded and limted ROM BLE allowed    General Comments        Pertinent Vitals/Pain Pain Assessment: Faces Faces Pain Scale: Hurts even more Pain Location: LLE when mobilizing only. Pain Descriptors / Indicators: Grimacing;Guarding Pain Intervention(s): Limited activity within patient's tolerance;Monitored during session;Repositioned    Home Living                      Prior Function            PT Goals (current goals can now be found in the care plan section) Progress towards PT goals: Not progressing toward goals - comment    Frequency    7X/week      PT Plan Current plan remains appropriate;Other (comment)    Co-evaluation              AM-PAC PT "6 Clicks" Mobility  Outcome Measure  Help needed turning from your back to your side while in a flat bed without using bedrails?: Total Help needed moving from lying on your back to sitting on the side of a flat bed without using bedrails?: Total Help needed moving to and from a bed to a chair (including a wheelchair)?: Total Help needed standing up from a chair using your arms (e.g., wheelchair or bedside chair)?: Total Help needed to walk in hospital room?: Total Help needed climbing 3-5 steps with a railing? : Total 6 Click Score: 6    End of Session Equipment Utilized During Treatment: Left knee immobilizer Activity Tolerance: Patient limited by lethargy;Patient limited by pain Patient left: in bed;with family/visitor present;with bed alarm set;with SCD's reapplied;with call bell/phone within reach Nurse Communication: Mobility status       Time: 6734-1937 PT Time Calculation (min) (ACUTE ONLY): 26 min  Charges:  $Therapeutic Exercise: 8-22 mins $Therapeutic Activity: 8-22 mins                    Danielle Dess, PTA 06/08/20, 2:24 PM

## 2020-06-08 NOTE — Progress Notes (Signed)
PROGRESS NOTE    Natalie Adams  ONG:[id number] DOB: [birth year] DOA: 06/05/2020 PCP: [facility]    Assessment & Plan:   Principal Problem:   Fracture of distal femur (HCC) Active Problems:   Hypertension, benign   Type II diabetes mellitus (HCC)   Pulmonary hypertension (HCC)   Bilateral hearing loss   Aortic stenosis   Accidental fall from bed, initial encounter   Preoperative clearance   Jhada Risk is a 87 y.o. female with medical history significant for HTN, DM, bilateral hearing loss, mild aortic stenosis and pulmonary hypertension, brought to the emergency room for evaluation of left leg pain following a fall onto her knees about 20 hours prior resulting in left leg pain.     Fracture of distal left femur (HCC) --s/p OPEN REDUCTION INTERNAL FIXATION (ORIF) DISTAL FEMUR FRACTURE on 2/4 -Unwitnessed fall onto knees with development of pain and swelling just above the left knee several hours later -X-ray left knee showing nondisplaced spiral fracture of the distal left femur Plan: --Patient will need follow-up with [facility] in 2 weeks --Continue with knee immobilizer --Lovenox 40 mg subcu daily x14 days at discharge --Nonweightbearing left lower extremity --PT rec SNF rehab, however, pt has no insurance, so daughter will take pt home.  Confusion due to delirium, improved --avoid too much sedatives --frequent orientation and keep day/night cycle.    Hypertension, benign -BP varied widely.   --cont home metop --hold home amlodipine for now    Type II diabetes mellitus (HCC) --A1c 7.0 --cont SSI    Pulmonary hypertension (HCC) -No acute concerns at this time    Bilateral hearing loss -Increase nursing assistance for communication    Aortic stenosis -No acute concerns at this time   HLD --cont home statin  Obesity, BMI 34.7  Urinary retention --post-op, pt found to have 799 ml in bladder.  Foley inserted. --d/c Foley 2/6   Plan: --bladder scan  Iron def anemia, microcytic --Hgb in 8's.  Anemia workup showed severe iron def Plan: --will discharge on iron supplement (daughter self pays, so avoid ordering more expensive IV iron).    DVT prophylaxis: Lovenox SQ Code Status: DNR  Family Communication: daughter updated at bedside today Status is: inpatient Dispo:   The patient is from: home Anticipated d/c is to: Home Anticipated d/c date is: tomorrow Patient currently is medically ready to d/c.   Subjective and Interval History:  Pt was more alert today, eating better.  Finally voided on her own.     Objective: Vitals:   06/08/20 0807 06/08/20 1105 06/08/20 1137 06/08/20 1517  BP: (!) 158/82 131/64 140/72 137/66  Pulse: 92 90 92 92  Resp: 17 18 18 18   Temp: 99.1 F (37.3 C) 98.6 F (37 C) 99 F (37.2 C) 98.4 F (36.9 C)  TempSrc: Oral  Oral   SpO2: 96% 100% 97% 95%  Weight:      Height:        Intake/Output Summary (Last 24 hours) at 06/08/2020 1554 Last data filed at 06/08/2020 1427 Gross per 24 hour  Intake 410 ml  Output 250 ml  Net 160 ml   Filed Weights   06/05/20 0022 06/05/20 1245  Weight: 86.2 kg 86.2 kg    Examination:   Constitutional: NAD, alert, smiling today HEENT: conjunctivae and lids normal, EOMI CV: No cyanosis.   RESP: normal respiratory effort, on RA Extremities: Mild edema in BLE SKIN: warm, dry Neuro: II - XII grossly intact.  Data Reviewed: I have personally reviewed following labs and imaging studies  CBC: Recent Labs  Lab 06/05/20 0222 06/06/20 1033 06/07/20 0748 06/08/20 0425  WBC 9.2 9.8 8.0 7.5  NEUTROABS 5.9  --   --   --   HGB 9.6* 8.7* 8.3* 7.7*  HCT 31.1* 27.8* 26.5* 24.5*  MCV 65.6* 65.1* 64.5* 64.5*  PLT 205 191 204 224   Basic Metabolic Panel: Recent Labs  Lab 06/05/20 0222 06/06/20 1033 06/07/20 0748 06/08/20 0425  NA 131* 131* 135 136  K 4.2 4.1 3.5 3.7  CL 99 100 101 103  CO2 24 21* 24 23  GLUCOSE 174* 223* 122*  127*  BUN 24* 13 12 15   CREATININE 0.78 0.62 0.70 0.65  CALCIUM 9.5 8.9 8.8* 8.7*  MG  --   --  1.5* 2.0   GFR: Estimated Creatinine Clearance: 49.5 mL/min (by C-G formula based on SCr of 0.65 mg/dL). Liver Function Tests: No results for input(s): AST, ALT, ALKPHOS, BILITOT, PROT, ALBUMIN in the last 168 hours. No results for input(s): LIPASE, AMYLASE in the last 168 hours. No results for input(s): AMMONIA in the last 168 hours. Coagulation Profile: Recent Labs  Lab 06/05/20 0300  INR 1.0   Cardiac Enzymes: No results for input(s): CKTOTAL, CKMB, CKMBINDEX, TROPONINI in the last 168 hours. BNP (last 3 results) No results for input(s): PROBNP in the last 8760 hours. HbA1C: No results for input(s): HGBA1C in the last 72 hours. CBG: Recent Labs  Lab 06/07/20 1922 06/08/20 0015 06/08/20 0350 06/08/20 0723 06/08/20 1140  GLUCAP 129* 91 123* 95 199*   Lipid Profile: No results for input(s): CHOL, HDL, LDLCALC, TRIG, CHOLHDL, LDLDIRECT in the last 72 hours. Thyroid Function Tests: No results for input(s): TSH, T4TOTAL, FREET4, T3FREE, THYROIDAB in the last 72 hours. Anemia Panel: Recent Labs    06/07/20 0748  VITAMINB12 388  FOLATE 10.7  TIBC 274  IRON 11*   Sepsis Labs: No results for input(s): PROCALCITON, LATICACIDVEN in the last 168 hours.  Recent Results (from the past 240 hour(s))  SARS Coronavirus 2 by RT PCR (hospital order, performed in [facility] hospital lab) Nasopharyngeal Nasopharyngeal Swab     Status: None   Collection Time: 06/05/20  3:23 AM   Specimen: Nasopharyngeal Swab  Result Value Ref Range Status   SARS Coronavirus 2 NEGATIVE NEGATIVE Final    Comment: (NOTE) SARS-CoV-2 target nucleic acids are NOT DETECTED.  The SARS-CoV-2 RNA is generally detectable in upper and lower respiratory specimens during the acute phase of infection. The lowest concentration of SARS-CoV-2 viral copies this assay can detect is 250 copies / mL. A negative result  does not preclude SARS-CoV-2 infection and should not be used as the sole basis for treatment or other patient management decisions.  A negative result may occur with improper specimen collection / handling, submission of specimen other than nasopharyngeal swab, presence of viral mutation(s) within the areas targeted by this assay, and inadequate number of viral copies (<250 copies / mL). A negative result must be combined with clinical observations, patient history, and epidemiological information.  Fact Sheet for Patients:   08/03/20  Fact Sheet for Healthcare Providers: BoilerBrush.com.cy  This test is not yet approved or  cleared by the https://pope.com/ FDA and has been authorized for detection and/or diagnosis of SARS-CoV-2 by FDA under an Emergency Use Authorization (EUA).  This EUA will remain in effect (meaning this test can be used) for the duration of the COVID-19 declaration under  Section 564(b)(1) of the Act, 21 U.S.C. section 360bbb-3(b)(1), unless the authorization is terminated or revoked sooner.  Performed at [facility], [address], [place], [place] [address]       Radiology Studies: No results found.   Scheduled Meds: . aspirin EC  81 mg Oral Daily  . Chlorhexidine Gluconate Cloth  6 each Topical Daily  . docusate sodium  100 mg Oral BID  . enoxaparin (LOVENOX) injection  0.5 mg/kg Subcutaneous Q24H  . feeding supplement (GLUCERNA SHAKE)  237 mL Oral TID BM  . insulin aspart  0-15 Units Subcutaneous Q4H  . metoprolol tartrate  12.5 mg Oral BID  . multivitamin with minerals  1 tablet Oral Daily  . pravastatin  40 mg Oral QHS   Continuous Infusions:    LOS: 3 days     Darlin Priestly, MD [facility] If 7PM-7AM, please contact night-coverage 06/08/2020, 3:54 PM

## 2020-06-08 NOTE — TOC Initial Note (Signed)
Transition of Care [facility]) - Initial/Assessment Note    Patient Details  Name: Natalie Adams MRN: [id number] Date of Birth: [birth year]  Transition of Care [facility]) CM/SW Contact:    Trenton Founds, RN Phone Number: 06/08/2020, 10:50 AM  Clinical Narrative:   RNCM called patient's daughter for assessment. Patient lives with daughter and she provides 24 hour care. She reports that she has equipment in home to care for patient that she has purchased in the past. Discussed recommendations for SNF at discharge but with no payer source this will be a difficult placement and may not be possible. Daughter reports that she would like to reach out and see if any beds are available and then if this is not a viable option she will take patient home. RNCM verified PASSR, completed FL2 and sent out bed requests.               Expected Discharge Plan: Skilled Nursing Facility     Patient Goals and CMS Choice        Expected Discharge Plan and Services Expected Discharge Plan: Skilled Nursing Facility                                              Prior Living Arrangements/Services   Lives with:: Adult Children   Do you feel safe going back to the place where you live?: Yes               Activities of Daily Living      Permission Sought/Granted                  Emotional Assessment         Alcohol / Substance Use: Not Applicable Psych Involvement: No (comment)  Admission diagnosis:  Femur fracture, left (HCC) [S72.92XA] Fall, initial encounter [W19.XXXA] Closed nondisplaced fracture of distal epiphysis of left femur, initial encounter [facility]) [S72.445A] Patient Active Problem List   Diagnosis Date Noted  . Fracture of distal femur (HCC) 06/05/2020  . Accidental fall from bed, initial encounter 06/05/2020  . Preoperative clearance 06/05/2020  . Femur fracture, left (HCC) 06/05/2020  . Pulmonary hypertension (HCC) 07/21/2017  . Aortic stenosis 07/21/2017  .  Bilateral hearing loss 01/24/2015  . Hypertension, benign 04/30/2010  . Type II diabetes mellitus (HCC) 04/30/2010   PCP:  [facility]:   [facility] [address]), [place] - [address] [place]) [place] [address] Phone: [phone] Fax: [phone]     Social Determinants of Health (SDOH) Interventions    Readmission Risk Interventions No flowsheet data found.

## 2020-06-08 NOTE — Progress Notes (Signed)
Subjective: 3 Days Post-Op Procedure(s) (LRB): OPEN REDUCTION INTERNAL FIXATION (ORIF) DISTAL FEMUR FRACTURE (Left) Patient alert this AM but is confused.  She is a difficult historian.   Patient is unable to communicate well, bilateral hearing loss. We will continue with physical therapy today.  Plan is to go to skilled nursing facility at discharge Denies any significant pain this morning.  Objective: Vital signs in last 24 hours: Temp:  [98 F (36.7 C)-99 F (37.2 C)] 98.6 F (37 C) (02/07 0722) Pulse Rate:  [82-103] 95 (02/07 0722) Resp:  [16-20] 18 (02/07 0722) BP: (102-147)/(57-71) 147/71 (02/07 0722) SpO2:  [96 %-100 %] 98 % (02/07 0722)  Intake/Output from previous day: 02/06 0701 - 02/07 0700 In: 1627.8 [P.O.:360; I.V.:1217.8; IV Piggyback:50] Out: 650 [Urine:650] Intake/Output this shift: No intake/output data recorded.  Recent Labs    06/06/20 1033 06/07/20 0748  HGB 8.7* 8.3*   Recent Labs    06/06/20 1033 06/07/20 0748  WBC 9.8 8.0  RBC 4.27 4.11  HCT 27.8* 26.5*  PLT 191 204   Recent Labs    06/07/20 0748 06/08/20 0425  NA 135 136  K 3.5 3.7  CL 101 103  CO2 24 23  BUN 12 15  CREATININE 0.70 0.65  GLUCOSE 122* 127*  CALCIUM 8.8* 8.7*   No results for input(s): LABPT, INR in the last 72 hours.  EXAM General - Patient is Alert and Unable to verbally communicate well.  She does not appear to be in any distress. Extremity - ABD soft Sensation intact distally No cellulitis present Compartment soft Knee immobilizer intact.  No edema. Dressing - dressing C/D/I, honeycomb dressings intact. Able to passively move the ankle with no discomfort  Past Medical History:  Diagnosis Date  . Arthritis   . Asthma   . Diabetes mellitus without complication (HCC)   . Hypertension   . Morbid obesity (HCC)     Assessment/Plan:   3 Days Post-Op Procedure(s) (LRB): OPEN REDUCTION INTERNAL FIXATION (ORIF) DISTAL FEMUR FRACTURE (Left) Principal  Problem:   Fracture of distal femur (HCC) Active Problems:   Hypertension, benign   Type II diabetes mellitus (HCC)   Pulmonary hypertension (HCC)   Bilateral hearing loss   Aortic stenosis   Accidental fall from bed, initial encounter   Preoperative clearance  Estimated body mass index is 34.76 kg/m as calculated from the following:   Height as of this encounter: 5\' 2"  (1.575 m).   Weight as of this encounter: 86.2 kg. Up with therapy, nonweightbearing left lower extremity Continue with knee immobilizer.  Vital signs stable, BP stable this morning. No drainage noted to left leg honeycombs. Pain seems to be well controlled, continue to avoid narcotics if able. Care management to assist with discharge to SNF  Patient will need follow-up with [facility] in 2 weeks Keep left lower extremity incision site clean and dry at all times. Continue with knee immobilizer Lovenox 40 mg subcu daily x14 days at discharge  DVT Prophylaxis - Lovenox and Foot Pumps Nonweightbearing left lower extremity  [facility], PA-C [facility] 06/08/2020, 7:58 AM

## 2020-06-09 LAB — CBC
HCT: 24.6 % — ABNORMAL LOW (ref 36.0–46.0)
Hemoglobin: 7.7 g/dL — ABNORMAL LOW (ref 12.0–15.0)
MCH: 20.2 pg — ABNORMAL LOW (ref 26.0–34.0)
MCHC: 31.3 g/dL (ref 30.0–36.0)
MCV: 64.6 fL — ABNORMAL LOW (ref 80.0–100.0)
Platelets: 262 10*3/uL (ref 150–400)
RBC: 3.81 MIL/uL — ABNORMAL LOW (ref 3.87–5.11)
RDW: 15.2 % (ref 11.5–15.5)
WBC: 9.2 10*3/uL (ref 4.0–10.5)
nRBC: 0.4 % — ABNORMAL HIGH (ref 0.0–0.2)

## 2020-06-09 LAB — BASIC METABOLIC PANEL
Anion gap: 9 (ref 5–15)
BUN: 16 mg/dL (ref 8–23)
CO2: 23 mmol/L (ref 22–32)
Calcium: 8.8 mg/dL — ABNORMAL LOW (ref 8.9–10.3)
Chloride: 103 mmol/L (ref 98–111)
Creatinine, Ser: 0.73 mg/dL (ref 0.44–1.00)
GFR, Estimated: >60 mL/min (ref >60)
Glucose, Bld: 176 mg/dL — ABNORMAL HIGH (ref 70–99)
Potassium: 3.7 mmol/L (ref 3.5–5.1)
Sodium: 135 mmol/L (ref 135–145)

## 2020-06-09 LAB — MAGNESIUM: Magnesium: 1.6 mg/dL — ABNORMAL LOW (ref 1.7–2.4)

## 2020-06-09 LAB — GLUCOSE, CAPILLARY
Glucose-Capillary: 175 mg/dL — ABNORMAL HIGH (ref 70–99)
Glucose-Capillary: 166 mg/dL — ABNORMAL HIGH (ref 70–99)
Glucose-Capillary: 179 mg/dL — ABNORMAL HIGH (ref 70–99)
Glucose-Capillary: 199 mg/dL — ABNORMAL HIGH (ref 70–99)

## 2020-06-09 MED ORDER — ASPIRIN EC 325 MG PO TBEC: 325.0000 mg | DELAYED_RELEASE_TABLET | Freq: Every day | ORAL | Status: AC

## 2020-06-09 MED ORDER — ACETAMINOPHEN 500 MG PO TABS: 1000.0000 mg | ORAL_TABLET | Freq: Two times a day (BID) | ORAL | Status: DC | PRN

## 2020-06-09 MED ORDER — ASPIRIN 81 MG PO TBEC: 325.0000 mg | DELAYED_RELEASE_TABLET | Freq: Two times a day (BID) | ORAL | Status: AC

## 2020-06-09 MED ORDER — MAGNESIUM SULFATE 2 GM/50ML IV SOLN
2.0000 g | Freq: Once | INTRAVENOUS | Status: AC
  Administered 2020-06-09: 2 g via INTRAVENOUS
  Filled 2020-06-09: qty 50

## 2020-06-09 MED ORDER — IRON 18 MG PO TBCR: 1.0000 | EXTENDED_RELEASE_TABLET | Freq: Every day | ORAL | 0 refills | Status: DC

## 2020-06-09 MED ORDER — INSULIN ASPART 100 UNIT/ML ~~LOC~~ SOLN
0.0000 [IU] | Freq: Three times a day (TID) | SUBCUTANEOUS | Status: DC
  Administered 2020-06-09: 3 [IU] via SUBCUTANEOUS
  Filled 2020-06-09: qty 1

## 2020-06-09 NOTE — Progress Notes (Signed)
Patient is stable and ready for discharge home. Patient's IV removed. Patient's daughter at bedside and writer went over paperwork with her and she verbalized understanding. All paperwork and DNR paper placed in discharge packet given to transporter and daughter aware. Patient was being transported by [facility] going home with daughter.

## 2020-06-09 NOTE — Evaluation (Signed)
Clinical/Bedside Swallow Evaluation Patient Details  Name: Natalie Adams MRN: [id number] Date of Birth: 1929/12/08  Today's Date: 06/09/2020 Time: SLP Start Time (ACUTE ONLY): 9798 SLP Stop Time (ACUTE ONLY): 1010 SLP Time Calculation (min) (ACUTE ONLY): 32 min  Past Medical History:  Past Medical History:  Diagnosis Date  . Arthritis   . Asthma   . Diabetes mellitus without complication (HCC)   . Hypertension   . Morbid obesity (HCC)    Past Surgical History:  Past Surgical History:  Procedure Laterality Date  . CATARACT EXTRACTION, BILATERAL    . ORIF FEMUR FRACTURE Left 06/05/2020   Procedure: OPEN REDUCTION INTERNAL FIXATION (ORIF) DISTAL FEMUR FRACTURE;  Surgeon: Christena Flake, MD;  Location: [facility] ORS;  Service: Orthopedics;  Laterality: Left;   HPI:  Pt is a 87 y.o. female with medical history significant for HTN, DM, bilateral hearing loss, mild aortic stenosis and pulmonary hypertension, brought to the emergency room for evaluation of left leg pain following a fall onto her knees about 20 hours prior resulting in left leg pain.  Most of the history is provided by daughter at bedside due to mother's hearing loss.  Patient apparently tried to get out of bed unassisted, falling onto her knees.  Was helped back up to bed with [facility] lift but as the day progressed was noted to have pain and swelling to the left thigh, just above the knee.  Patient was previously in her usual state of health.  No recent illness.  Denies recent fever, chills, cough or shortness of breath, nausea vomiting, abdominal pain or diarrhea or dysuria. X-ray left knee showing nondisplaced spiral distal femur fracture. Surgery f/u; Open reduction and internal fixation of left supracondylar femur fracture.  Chest x-ray clear.   Assessment / Plan / Recommendation Clinical Impression  Pt appears to present w/ grossly functional oropharyngeal phase swallowing but appears at Min increased risk for aspiration d/t current  medical status(lying in bed) and dependency on being fed. She also has Hearing Loss and her eyes were closed the majority of the eval when given po trials by Daughter(present). Unsure of pt's Baseline Cognitive status; she required Min+ cues for follow through w/ po tasks. These issues can impact overall awareness/engagement and safety during po tasks which can increase risk for aspiration, choking. Pt's Dtr stated she is usually able to hold Cup and feed self - does not use straws at baseline. Pt's risk for aspiration during medical illness(and in general) can be reduced when following general aspiration precautions and given feeding assistance. She required Min+ tactile/verbal/ visual cues for orientation to bolus presentation, and during feeding support(she often maintained closed eyes and mumbled speech during the eval). Dtr stated pt had been "sleepy right now". With Dtr's offering to feed and support pt, pt consumed a few trials of thin liquids and purees both via TSP being fed by Dtr w/ overt coughing noted x1 w/ thin liquids; no further immediate, overt clinical s/s of aspiration noted; no decline in respiratory status during/post trials. Oral phase was adequate for bolus management and oral clearing of the boluses given. Dtr reported pt was able to eat "some soft egg" and Oatmeal(NSG reported some difficulty w/ solid foods this AM at meal prompting this eval). OM Exam: pt Edentulous; cursory/observation revealed no unilateral weakness noted.  D/t pt's declined current medical status and Drowsy State, and her risk for aspiration, recommend initiation of general aspiration precautions including NO Straws, sitting Fully upright in bed when drinking liquids --  a Mech Soft diet(dysphagia level 3(chopped) w/ Thin liquids w/ general aspiration precautions would be beneficial also d/t pt's Edentulous status. Dtr stated pt swallows Pills in Puree for safer swallowing. Support w/ feeding at meals -- check for oral  clearing during/post intake and feed Slowly giving pt Time to orient to the boluses an clear fully. MD/NSG updated. Handouts discussed and given on general aspiration precautions. Dtr stated appreciation. ST services can be available for any further needs while admitted. SLP Visit Diagnosis: Dysphagia, unspecified (R13.10) (seemed impacted by [facility])    Aspiration Risk  Mild aspiration risk;Risk for inadequate nutrition/hydration (reduced following precautions)    Diet Recommendation  more of a Mech Soft diet consistency d/t Edentulous status; thin liquids. Moist, chopped foods for ease of oral inake. General aspiration precautions; Pills in Puree (baseline).  Feeding support at meals - sit fully upright w/ all oral intake.   Medication Administration: Whole meds with puree (baseline)    Other  Recommendations Recommended Consults:  (Dietician f/u as needed) Oral Care Recommendations: Oral care BID;Oral care before and after PO;Staff/trained caregiver to provide oral care Other Recommendations:  (n/a)   Follow up Recommendations None      Frequency and Duration  (n/a)   (n/a)       Prognosis Prognosis for Safe Diet Advancement: Fair (-Goood) Barriers to Reach Goals:  (deconditioning)      Swallow Study   General Date of Onset: 06/09/20 HPI: Pt is a 87 y.o. female with medical history significant for HTN, DM, bilateral hearing loss, mild aortic stenosis and pulmonary hypertension, brought to the emergency room for evaluation of left leg pain following a fall onto her knees about 20 hours prior resulting in left leg pain.  Most of the history is provided by daughter at bedside due to mother's hearing loss.  Patient apparently tried to get out of bed unassisted, falling onto her knees.  Was helped back up to bed with [facility] lift but as the day progressed was noted to have pain and swelling to the left thigh, just above the knee.  Patient was previously in her usual state of health.  No recent  illness.  Denies recent fever, chills, cough or shortness of breath, nausea vomiting, abdominal pain or diarrhea or dysuria. X-ray left knee showing nondisplaced spiral distal femur fracture. Surgery f/u; Open reduction and internal fixation of left supracondylar femur fracture.  Chest x-ray clear. Type of Study: Bedside Swallow Evaluation Previous Swallow Assessment: none Diet Prior to this Study: Regular;Thin liquids Temperature Spikes Noted: No (wbc 9.2) Respiratory Status: Room air History of Recent Intubation: No Behavior/Cognition: Cooperative;Pleasant mood;Distractible;Requires cueing (Eyes closed during majority of sessoin; hearing loss) Oral Cavity Assessment: Within Functional Limits Oral Care Completed by SLP: Recent completion by staff Oral Cavity - Dentition: Edentulous (has Dentures per Dtr but often does not wear them) Vision:  (eyes closed often) Self-Feeding Abilities: Total assist (by Dtr) Patient Positioning: Upright in bed (needed min more positioning upright for head forward) Baseline Vocal Quality: Low vocal intensity    Oral/Motor/Sensory Function Overall Oral Motor/Sensory Function: Within functional limits (w/ bolus acceptance and management)   Ice Chips Ice chips: Not tested   Thin Liquid Thin Liquid: Impaired Presentation: Spoon (fed by Dtr; 4 trials) Oral Phase Impairments: Poor awareness of bolus (eyes closed often) Pharyngeal  Phase Impairments: Cough - Immediate (x1) Other Comments: seemed d/t lack of awareness    Nectar Thick Nectar Thick Liquid: Not tested   Honey Thick Honey Thick Liquid:  Not tested   Puree Puree: Within functional limits Presentation: Spoon (fed; 3 trials)   Solid     Solid: Not tested       Natalie Som, MS, CCC-SLP Speech Language Pathologist Rehab Services [phone] Natalie Adams 06/09/2020,11:59 AM

## 2020-06-09 NOTE — Progress Notes (Signed)
Physical Therapy Treatment Patient Details Name: Natalie Adams MRN: [id number] DOB: June 24, 1929 Today's Date: 06/09/2020    History of Present Illness Makinzee Durley is a 89yoF presenting d/t fall from w/c with nondisplaced L femur fx. She presents with a PMHx significant for arthritis, asthma, DM, HTN, and obesity. She is now s/p ORIF of L supracondylar femur fx and NWB of the affected limb. Prior to admission the pt was A&Ox4, she was able to participate in rolling, stand pivot transfers to bedside commode, and some positioning in w/c. Per the pt's daughter she and the patient utilize the hoyer lift for most transfers. At baseline the pt was an active participant in all mobility    PT Comments    Pt in bed upon arrival, asleep throughout much of session, does awaken briefly to complain about the eggs early, and then later asks for some home made fried okra. Handout brought to room for bed level exercises. DTR educated on HEP with teach back method, reviewed with handout. DTR endorses confidence in being able to care for patient at DC> Unfortunately pt has not been able to achieve her baseline mentation since admitted, hence participation with PT has been very much limited.     Follow Up Recommendations  Supervision/Assistance - 24 hour;Home health PT;Supervision for mobility/OOB (Plan is for pt to return to home; Pt was largely nonambulatory prior to admission; using mechanical lift at home.)     Equipment Recommendations  None recommended by PT    Recommendations for Other Services       Precautions / Restrictions Precautions Precautions: Fall Required Braces or Orthoses: Knee Immobilizer - Left Knee Immobilizer - Left: On at all times Restrictions Weight Bearing Restrictions: Yes LLE Weight Bearing: Non weight bearing    Mobility  Bed Mobility                  Transfers                    Ambulation/Gait                 Stairs              Wheelchair Mobility    Modified Rankin (Stroke Patients Only)       Balance                                            Cognition Arousal/Alertness: Lethargic                                            Exercises General Exercises - Lower Extremity Ankle Circles/Pumps: PROM;15 reps;Supine;Both Quad Sets: PROM;Right;5 reps;Supine Gluteal Sets: PROM;Right;5 reps;Supine Short Arc Quad: PROM;Right;Supine;15 reps Heel Slides: PROM;Right;15 reps;Supine Hip ABduction/ADduction: PROM;Right;15 reps;Supine Straight Leg Raises: PROM;Left;10 reps;Supine    General Comments        Pertinent Vitals/Pain Pain Assessment: Faces Faces Pain Scale: No hurt Pain Location: appears comfortable although asleep for much of session Pain Intervention(s): Monitored during session;Repositioned;Limited activity within patient's tolerance    Home Living                      Prior Function            PT Goals (current  goals can now be found in the care plan section) Acute Rehab PT Goals PT Goal Formulation: With family Time For Goal Achievement: 06/20/20 Potential to Achieve Goals: Fair Progress towards PT goals: Progressing toward goals    Frequency    Min 2X/week      PT Plan Frequency needs to be updated;Current plan remains appropriate    Co-evaluation              AM-PAC PT "6 Clicks" Mobility   Outcome Measure  Help needed turning from your back to your side while in a flat bed without using bedrails?: Total Help needed moving from lying on your back to sitting on the side of a flat bed without using bedrails?: Total Help needed moving to and from a bed to a chair (including a wheelchair)?: Total Help needed standing up from a chair using your arms (e.g., wheelchair or bedside chair)?: Total Help needed to walk in hospital room?: Total Help needed climbing 3-5 steps with a railing? : Total 6 Click Score: 6    End  of Session Equipment Utilized During Treatment: Left knee immobilizer Activity Tolerance: Patient limited by lethargy Patient left: in bed;with family/visitor present;with bed alarm set;with SCD's reapplied;with call bell/phone within reach   PT Visit Diagnosis: Muscle weakness (generalized) (M62.81);Other abnormalities of gait and mobility (R26.89)     Time: 5027-7412 PT Time Calculation (min) (ACUTE ONLY): 22 min  Charges:  $Self Care/Home Management: 8-22                     11:59 AM, 06/09/20 Rosamaria Lints, PT, DPT Physical Therapist - [facility]  [phone] (ASCOM)    Ayauna Mcnay C 06/09/2020, 11:56 AM

## 2020-06-09 NOTE — Discharge Summary (Signed)
Physician Discharge Summary   Natalie Adams  female DOB: Apr 02, 1930  YKD:[id number]  PCP: [facility]  Admit date: 06/05/2020 Discharge date: 06/09/2020  Admitted From: home Disposition:  Home. Daughter updated about discharge plans prior to discharge.  Home Health: Yes  Pending availability of charity HH. CODE STATUS: DNR  Discharge Instructions    Discharge instructions   Complete by: As directed    Per orthopedics:  Patient will need follow-up with [facility] orthopedics in 2 weeks Keep left lower extremity incision site clean and dry at all times. Continue with knee immobilizer Nonweightbearing left lower extremity - Please take aspirin 325 mg twice a day for 2 weeks and then aspirin 325 mg once a day for 4 weeks, for blood clot prevention.  You are very iron deficient.  Please take any over-the-counter iron supplements.   Dr. Darlin Priestly - -   No wound care   Complete by: As directed        Hospital Course:  For full details, please see H&P, progress notes, consult notes and ancillary notes.  Briefly,  Natalie Adams a 87 y.o.femalewith medical history significant forHTN, DM, bilateral hearing loss, mild aortic stenosis and pulmonary hypertension, brought to the emergency room for evaluation of left leg pain following a fall onto her knees about 20 hours prior resulting in left leg pain.   Fracture of distal leftfemur (HCC) s/p OPEN REDUCTION INTERNAL FIXATION (ORIF) DISTAL FEMUR FRACTURE on 2/4 -X-ray left knee showing nondisplaced spiral fracture of the distal left femur.  Routine healing post-op.  PT rec SNF rehab, however, pt has no insurance, so daughter took pt home. --Patient will need follow-up with [facility] orthopedics in 2 weeks --Continue with knee immobilizer --Nonweightbearing left lower extremity --Since pt is self-pay and Lovenox was more expensive, alternative DVT ppx was approve by ortho Dr. Joice Lofts with aspirin 325 mg twice a day for 2  weeks and then aspirin 325 mg once a day for 4 weeks  Confusion due to delirium, improved Avoided sedatives. --frequent orientation and keep day/night cycle.  Daughter was at the bedside helping feed the pt, and mental status improved prior to discharge.  Hypertension, benign -BP varied widely.   --cont home metop --home amlodipine held during hospitalization but resumed at discharge.  Type II diabetes mellitus (HCC) --A1c 7.0.  Pt received SSI during hospitalization and was discharged back to home diabetic regimen.  Pulmonary hypertension (HCC) -No acute concerns at this time  Bilateral hearing loss -Increase nursing assistance for communication  Aortic stenosis -No acute concerns at this time  HLD --cont home statin  Obesity, BMI 34.7  Urinary retention --post-op, pt found to have 799 ml in bladder.  Foley inserted.  Foley d/c'ed on 2/6, and pt was able to void on her own.  Iron def anemia, microcytic --Hgb in 8's.  Anemia workup showed severe iron def.  Pt didn't receive IV iron during hospitalization due to cost (pt's daughter self pays).  Pt was discharged on iron supplement.   Discharge Diagnoses:  Principal Problem:   Fracture of distal femur (HCC) Active Problems:   Hypertension, benign   Type II diabetes mellitus (HCC)   Pulmonary hypertension (HCC)   Bilateral hearing loss   Aortic stenosis   Accidental fall from bed, initial encounter   Preoperative clearance    Discharge Instructions:  Allergies as of 06/09/2020      Reactions   Amoxicillin-pot Clavulanate Diarrhea   Hydrochlorothiazide Other (See Comments)   "she got  shaky and fell" "she got shaky and fell"      Medication List    TAKE these medications   acetaminophen 500 MG tablet Commonly known as: TYLENOL Take 2 tablets (1,000 mg total) by mouth 2 (two) times daily as needed for mild pain or moderate pain.   Algesis Tabs Take by mouth. Notes to patient: Not given  this hospital visit   amLODipine 10 MG tablet Commonly known as: NORVASC Take by mouth. Notes to patient: Not given this hospital visit   ascorbic acid 1000 MG tablet Commonly known as: VITAMIN C Take by mouth.   aspirin 81 MG EC tablet Take 4 tablets (325 mg total) by mouth 2 (two) times daily for 14 days. What changed:   how much to take  when to take this   aspirin EC 325 MG tablet Take 1 tablet (325 mg total) by mouth daily for 28 days. Start taking on: June 23, 2020 What changed: You were already taking a medication with the same name, and this prescription was added. Make sure you understand how and when to take each.   Cholecalciferol 125 MCG (5000 UT) Tabs Take 1 tablet by mouth daily.   Fish Oil 1200 MG Caps Take by mouth.   insulin lispro 100 UNIT/ML injection Commonly known as: HUMALOG Sliding scale insulin # of units = (BS-150/50) TID AC   insulin NPH Human 100 UNIT/ML injection Commonly known as: NOVOLIN N Inject 15 Units into the skin daily before breakfast.   ipratropium-albuterol 0.5-2.5 (3) MG/3ML Soln Commonly known as: DUONEB SMARTSIG:1 Ampule(s) Via Nebulizer Every 6 Hours PRN   Iron 18 MG Tbcr Take 1 tablet (18 mg total) by mouth daily. Can take any over-the-counter iron supplement.   metFORMIN 1000 MG tablet Commonly known as: GLUCOPHAGE Take by mouth in the morning and at bedtime.   metoprolol tartrate 25 MG tablet Commonly known as: LOPRESSOR Take 12.5 mg by mouth 2 (two) times daily.   Multiple Vitamins tablet Take by mouth.   pravastatin 40 MG tablet Commonly known as: PRAVACHOL Take 40 mg by mouth at bedtime.        Follow-up Information    Anson Oregon, PA-C On 06/22/2020.   Specialty: Physician Assistant Why:  at [facility] information: [address] [place] [address] [phone]        [facility] Schedule an appointment as soon as possible for a visit  in 1 week(s).   Specialty: Geriatric Medicine Contact information: [address] [place] [address] [phone]               Allergies  Allergen Reactions  . Amoxicillin-Pot Clavulanate Diarrhea  . Hydrochlorothiazide Other (See Comments)    "she got shaky and fell" "she got shaky and fell"      The results of significant diagnostics from this hospitalization (including imaging, microbiology, ancillary and laboratory) are listed below for reference.   Consultations:   Procedures/Studies: DG Chest Port 1 View  Result Date: 06/05/2020 CLINICAL DATA:  Preop evaluation for upcoming orthopedic surgery, known left EXAM: PORTABLE CHEST 1 VIEW COMPARISON:  None. FINDINGS: Cardiac shadow is mildly enlarged. The lungs are well aerated bilaterally. No focal infiltrate or effusion is seen. No bony abnormality is noted. IMPRESSION: No acute abnormality seen. Electronically Signed   By: Alcide Clever M.D.   On: 06/05/2020 03:24   DG Knee Complete 4 Views Left  Result Date: 06/05/2020 CLINICAL DATA:  Pain  and swelling after fall EXAM: LEFT KNEE - COMPLETE 4+ VIEW COMPARISON:  None. FINDINGS: There is a nondisplaced spiral type fracture of the distal left femur. No dislocation at the knee. IMPRESSION: Nondisplaced spiral type fracture of the distal left femur. Electronically Signed   By: Deatra Robinson M.D.   On: 06/05/2020 02:33   DG C-Arm 1-60 Min  Result Date: 06/05/2020 CLINICAL DATA:  Fracture. Additional history provided: Open reduction internal fixation (ORIF) distal femur fracture. Fluoroscopy time 1 minutes 4 seconds (9.1 mGy). EXAM: LEFT FEMUR 2 VIEWS; DG C-ARM 1-60 MIN COMPARISON:  Radiographs of the left knee 06/05/2020. FINDINGS: Six intraoperative fluoroscopic images of the left femur are submitted. The images demonstrate interval lateral plate and screw fixation of the mid to distal femur, traversing a known acute fracture. There is near anatomic alignment.  IMPRESSION: Six intraoperative fluoroscopic images of the left femur from left femoral ORIF, as described. Electronically Signed   By: Jackey Loge DO   On: 06/05/2020 15:30   DG Hip Unilat W or Wo Pelvis 2-3 Views Left  Result Date: 06/05/2020 CLINICAL DATA:  Recent fall with left hip pain, initial encounter EXAM: DG HIP (WITH OR WITHOUT PELVIS) 3V LEFT COMPARISON:  None. FINDINGS: Pelvic ring is intact. Mild degenerative changes of the hip joints are noted. No acute fracture or dislocation is seen. No soft tissue abnormality is noted. IMPRESSION: No acute abnormality noted. Electronically Signed   By: Alcide Clever M.D.   On: 06/05/2020 00:39   DG FEMUR MIN 2 VIEWS LEFT  Result Date: 06/05/2020 CLINICAL DATA:  Fracture. Additional history provided: Open reduction internal fixation (ORIF) distal femur fracture. Fluoroscopy time 1 minutes 4 seconds (9.1 mGy). EXAM: LEFT FEMUR 2 VIEWS; DG C-ARM 1-60 MIN COMPARISON:  Radiographs of the left knee 06/05/2020. FINDINGS: Six intraoperative fluoroscopic images of the left femur are submitted. The images demonstrate interval lateral plate and screw fixation of the mid to distal femur, traversing a known acute fracture. There is near anatomic alignment. IMPRESSION: Six intraoperative fluoroscopic images of the left femur from left femoral ORIF, as described. Electronically Signed   By: Jackey Loge DO   On: 06/05/2020 15:30      Labs: BNP (last 3 results) No results for input(s): BNP in the last 8760 hours. Basic Metabolic Panel: Recent Labs  Lab 06/05/20 0222 06/06/20 1033 06/07/20 0748 06/08/20 0425 06/09/20 0451  NA 131* 131* 135 136 135  K 4.2 4.1 3.5 3.7 3.7  CL 99 100 101 103 103  CO2 24 21* 24 23 23   GLUCOSE 174* 223* 122* 127* 176*  BUN 24* 13 12 15 16   CREATININE 0.78 0.62 0.70 0.65 0.73  CALCIUM 9.5 8.9 8.8* 8.7* 8.8*  MG  --   --  1.5* 2.0 1.6*   Liver Function Tests: No results for input(s): AST, ALT, ALKPHOS, BILITOT, PROT,  ALBUMIN in the last 168 hours. No results for input(s): LIPASE, AMYLASE in the last 168 hours. No results for input(s): AMMONIA in the last 168 hours. CBC: Recent Labs  Lab 06/05/20 0222 06/06/20 1033 06/07/20 0748 06/08/20 0425 06/09/20 0451  WBC 9.2 9.8 8.0 7.5 9.2  NEUTROABS 5.9  --   --   --   --   HGB 9.6* 8.7* 8.3* 7.7* 7.7*  HCT 31.1* 27.8* 26.5* 24.5* 24.6*  MCV 65.6* 65.1* 64.5* 64.5* 64.6*  PLT 205 191 204 224 262   Cardiac Enzymes: No results for input(s): CKTOTAL, CKMB, CKMBINDEX, TROPONINI in the last  168 hours. BNP: Invalid input(s): POCBNP CBG: Recent Labs  Lab 06/08/20 1606 06/08/20 2328 06/09/20 0420 06/09/20 0759 06/09/20 1108  GLUCAP 160* 175* 166* 179* 199*   D-Dimer No results for input(s): DDIMER in the last 72 hours. Hgb A1c No results for input(s): HGBA1C in the last 72 hours. Lipid Profile No results for input(s): CHOL, HDL, LDLCALC, TRIG, CHOLHDL, LDLDIRECT in the last 72 hours. Thyroid function studies No results for input(s): TSH, T4TOTAL, T3FREE, THYROIDAB in the last 72 hours.  Invalid input(s): FREET3 Anemia work up Recent Labs    06/07/20 0748  VITAMINB12 388  FOLATE 10.7  TIBC 274  IRON 11*   Urinalysis No results found for: COLORURINE, APPEARANCEUR, LABSPEC, PHURINE, GLUCOSEU, HGBUR, BILIRUBINUR, KETONESUR, PROTEINUR, UROBILINOGEN, NITRITE, LEUKOCYTESUR Sepsis Labs Invalid input(s): PROCALCITONIN,  WBC,  LACTICIDVEN Microbiology Recent Results (from the past 240 hour(s))  SARS Coronavirus 2 by RT PCR (hospital order, performed in [facility] hospital lab) Nasopharyngeal Nasopharyngeal Swab     Status: None   Collection Time: 06/05/20  3:23 AM   Specimen: Nasopharyngeal Swab  Result Value Ref Range Status   SARS Coronavirus 2 NEGATIVE NEGATIVE Final    Comment: (NOTE) SARS-CoV-2 target nucleic acids are NOT DETECTED.  The SARS-CoV-2 RNA is generally detectable in upper and lower respiratory specimens during the acute  phase of infection. The lowest concentration of SARS-CoV-2 viral copies this assay can detect is 250 copies / mL. A negative result does not preclude SARS-CoV-2 infection and should not be used as the sole basis for treatment or other patient management decisions.  A negative result may occur with improper specimen collection / handling, submission of specimen other than nasopharyngeal swab, presence of viral mutation(s) within the areas targeted by this assay, and inadequate number of viral copies (<250 copies / mL). A negative result must be combined with clinical observations, patient history, and epidemiological information.  Fact Sheet for Patients:   BoilerBrush.com.cy  Fact Sheet for Healthcare Providers: https://pope.com/  This test is not yet approved or  cleared by the [place] FDA and has been authorized for detection and/or diagnosis of SARS-CoV-2 by FDA under an Emergency Use Authorization (EUA).  This EUA will remain in effect (meaning this test can be used) for the duration of the COVID-19 declaration under Section 564(b)(1) of the Act, 21 U.S.C. section 360bbb-3(b)(1), unless the authorization is terminated or revoked sooner.  Performed at [facility], [address] [place], [place] [address]      Total time spend on discharging this patient, including the last patient exam, discussing the hospital stay, instructions for ongoing care as it relates to all pertinent caregivers, as well as preparing the medical discharge records, prescriptions, and/or referrals as applicable, is 45 minutes.    Darlin Priestly, MD  [facility] 06/09/2020, 12:07 PM

## 2020-10-09 ENCOUNTER — Emergency Department: Payer: Medicaid Other

## 2020-10-09 ENCOUNTER — Inpatient Hospital Stay
Admission: EM | Admit: 2020-10-09 | Discharge: 2020-10-12 | DRG: 872 | Disposition: A | Discharge status: Home / Self Care | Payer: Medicaid Other | Attending: Internal Medicine | Admitting: Internal Medicine

## 2020-10-09 ENCOUNTER — Other Ambulatory Visit: Payer: Self-pay

## 2020-10-09 DIAGNOSIS — Z7984 Long term (current) use of oral hypoglycemic drugs: Secondary | ICD-10-CM

## 2020-10-09 DIAGNOSIS — A419 Sepsis, unspecified organism: Secondary | ICD-10-CM

## 2020-10-09 DIAGNOSIS — Z20822 Contact with and (suspected) exposure to covid-19: Secondary | ICD-10-CM | POA: Diagnosis present

## 2020-10-09 DIAGNOSIS — R4182 Altered mental status, unspecified: Secondary | ICD-10-CM | POA: Diagnosis present

## 2020-10-09 DIAGNOSIS — I10 Essential (primary) hypertension: Secondary | ICD-10-CM | POA: Diagnosis present

## 2020-10-09 DIAGNOSIS — Z881 Allergy status to other antibiotic agents status: Secondary | ICD-10-CM | POA: Diagnosis not present

## 2020-10-09 DIAGNOSIS — Z8249 Family history of ischemic heart disease and other diseases of the circulatory system: Secondary | ICD-10-CM

## 2020-10-09 DIAGNOSIS — M1990 Unspecified osteoarthritis, unspecified site: Secondary | ICD-10-CM | POA: Diagnosis present

## 2020-10-09 DIAGNOSIS — Z79899 Other long term (current) drug therapy: Secondary | ICD-10-CM | POA: Diagnosis not present

## 2020-10-09 DIAGNOSIS — E669 Obesity, unspecified: Secondary | ICD-10-CM | POA: Diagnosis present

## 2020-10-09 DIAGNOSIS — W06XXXA Fall from bed, initial encounter: Secondary | ICD-10-CM

## 2020-10-09 DIAGNOSIS — E119 Type 2 diabetes mellitus without complications: Secondary | ICD-10-CM | POA: Diagnosis present

## 2020-10-09 DIAGNOSIS — Z6831 Body mass index (BMI) 31.0-31.9, adult: Secondary | ICD-10-CM | POA: Diagnosis not present

## 2020-10-09 DIAGNOSIS — E875 Hyperkalemia: Secondary | ICD-10-CM | POA: Diagnosis present

## 2020-10-09 DIAGNOSIS — I272 Pulmonary hypertension, unspecified: Secondary | ICD-10-CM | POA: Diagnosis present

## 2020-10-09 DIAGNOSIS — Z794 Long term (current) use of insulin: Secondary | ICD-10-CM | POA: Diagnosis not present

## 2020-10-09 DIAGNOSIS — K8020 Calculus of gallbladder without cholecystitis without obstruction: Secondary | ICD-10-CM | POA: Diagnosis present

## 2020-10-09 DIAGNOSIS — E860 Dehydration: Secondary | ICD-10-CM | POA: Diagnosis present

## 2020-10-09 DIAGNOSIS — A4152 Sepsis due to Pseudomonas: Secondary | ICD-10-CM | POA: Diagnosis not present

## 2020-10-09 DIAGNOSIS — H9193 Unspecified hearing loss, bilateral: Secondary | ICD-10-CM | POA: Diagnosis present

## 2020-10-09 DIAGNOSIS — Z66 Do not resuscitate: Secondary | ICD-10-CM | POA: Diagnosis present

## 2020-10-09 DIAGNOSIS — R338 Other retention of urine: Secondary | ICD-10-CM | POA: Diagnosis present

## 2020-10-09 DIAGNOSIS — N179 Acute kidney failure, unspecified: Secondary | ICD-10-CM | POA: Diagnosis present

## 2020-10-09 DIAGNOSIS — E872 Acidosis: Secondary | ICD-10-CM | POA: Diagnosis present

## 2020-10-09 DIAGNOSIS — J45909 Unspecified asthma, uncomplicated: Secondary | ICD-10-CM | POA: Diagnosis present

## 2020-10-09 DIAGNOSIS — I350 Nonrheumatic aortic (valve) stenosis: Secondary | ICD-10-CM | POA: Diagnosis present

## 2020-10-09 DIAGNOSIS — R339 Retention of urine, unspecified: Secondary | ICD-10-CM | POA: Diagnosis present

## 2020-10-09 DIAGNOSIS — N390 Urinary tract infection, site not specified: Secondary | ICD-10-CM

## 2020-10-09 DIAGNOSIS — Z888 Allergy status to other drugs, medicaments and biological substances status: Secondary | ICD-10-CM

## 2020-10-09 LAB — CBC WITH DIFFERENTIAL/PLATELET
Abs Immature Granulocytes: 0.06 10*3/uL (ref 0.00–0.07)
Basophils Absolute: 0.1 10*3/uL (ref 0.0–0.1)
Basophils Relative: 1 %
Eosinophils Absolute: 0 10*3/uL (ref 0.0–0.5)
Eosinophils Relative: 0 %
HCT: 40.8 % (ref 36.0–46.0)
Hemoglobin: 12.9 g/dL (ref 12.0–15.0)
Immature Granulocytes: 0 %
Lymphocytes Relative: 10 %
Lymphs Abs: 1.5 10*3/uL (ref 0.7–4.0)
MCH: 19.9 pg — ABNORMAL LOW (ref 26.0–34.0)
MCHC: 31.6 g/dL (ref 30.0–36.0)
MCV: 63 fL — ABNORMAL LOW (ref 80.0–100.0)
Monocytes Absolute: 1.5 10*3/uL — ABNORMAL HIGH (ref 0.1–1.0)
Monocytes Relative: 10 %
Neutro Abs: 12.2 10*3/uL — ABNORMAL HIGH (ref 1.7–7.7)
Neutrophils Relative %: 79 %
Platelets: 336 10*3/uL (ref 150–400)
RBC: 6.48 MIL/uL — ABNORMAL HIGH (ref 3.87–5.11)
RDW: 18.9 % — ABNORMAL HIGH (ref 11.5–15.5)
Smear Review: NORMAL
WBC: 15.3 10*3/uL — ABNORMAL HIGH (ref 4.0–10.5)
nRBC: 0.2 % (ref 0.0–0.2)

## 2020-10-09 LAB — URINALYSIS, COMPLETE (UACMP) WITH MICROSCOPIC
Bilirubin Urine: NEGATIVE
Glucose, UA: >=500 mg/dL — AB
Ketones, ur: 5 mg/dL — AB
Nitrite: NEGATIVE
Protein, ur: 100 mg/dL — AB
Specific Gravity, Urine: 1.015 (ref 1.005–1.030)
Squamous Epithelial / HPF: NONE SEEN (ref 0–5)
WBC, UA: >50 WBC/hpf — ABNORMAL HIGH (ref 0–5)
pH: 5 (ref 5.0–8.0)

## 2020-10-09 LAB — LACTIC ACID, PLASMA
Lactic Acid, Venous: 2.2 mmol/L (ref 0.5–1.9)
Lactic Acid, Venous: 2.7 mmol/L (ref 0.5–1.9)
Lactic Acid, Venous: 3.1 mmol/L (ref 0.5–1.9)
Lactic Acid, Venous: 1.9 mmol/L (ref 0.5–1.9)

## 2020-10-09 LAB — COMPREHENSIVE METABOLIC PANEL
ALT: 21 U/L (ref 0–44)
AST: 36 U/L (ref 15–41)
Albumin: 3.5 g/dL (ref 3.5–5.0)
Alkaline Phosphatase: 103 U/L (ref 38–126)
Anion gap: 11 (ref 5–15)
BUN: 27 mg/dL — ABNORMAL HIGH (ref 8–23)
CO2: 22 mmol/L (ref 22–32)
Calcium: 9.6 mg/dL (ref 8.9–10.3)
Chloride: 100 mmol/L (ref 98–111)
Creatinine, Ser: 1.24 mg/dL — ABNORMAL HIGH (ref 0.44–1.00)
GFR, Estimated: 42 mL/min — ABNORMAL LOW (ref >60)
Glucose, Bld: 188 mg/dL — ABNORMAL HIGH (ref 70–99)
Potassium: 5.5 mmol/L — ABNORMAL HIGH (ref 3.5–5.1)
Sodium: 133 mmol/L — ABNORMAL LOW (ref 135–145)
Total Bilirubin: 1.2 mg/dL (ref 0.3–1.2)
Total Protein: 8.1 g/dL (ref 6.5–8.1)

## 2020-10-09 LAB — CBG MONITORING, ED: Glucose-Capillary: 192 mg/dL — ABNORMAL HIGH (ref 70–99)

## 2020-10-09 LAB — HEMOGLOBIN A1C
Hgb A1c MFr Bld: 7.9 % — ABNORMAL HIGH (ref 4.8–5.6)
Mean Plasma Glucose: 180 mg/dL

## 2020-10-09 LAB — LIPASE, BLOOD: Lipase: 22 U/L (ref 11–51)

## 2020-10-09 LAB — TROPONIN I (HIGH SENSITIVITY)
Troponin I (High Sensitivity): 31 ng/L — ABNORMAL HIGH (ref <18)
Troponin I (High Sensitivity): 18 ng/L — ABNORMAL HIGH (ref <18)

## 2020-10-09 LAB — RESP PANEL BY RT-PCR (FLU A&B, COVID) ARPGX2
Influenza A by PCR: NEGATIVE
Influenza B by PCR: NEGATIVE
SARS Coronavirus 2 by RT PCR: NEGATIVE

## 2020-10-09 LAB — GLUCOSE, CAPILLARY
Glucose-Capillary: 155 mg/dL — ABNORMAL HIGH (ref 70–99)
Glucose-Capillary: 184 mg/dL — ABNORMAL HIGH (ref 70–99)
Glucose-Capillary: 206 mg/dL — ABNORMAL HIGH (ref 70–99)

## 2020-10-09 MED ORDER — LACTATED RINGERS IV SOLN: INTRAVENOUS | Status: DC

## 2020-10-09 MED ORDER — CHLORHEXIDINE GLUCONATE CLOTH 2 % EX PADS
6.0000 | MEDICATED_PAD | Freq: Every day | CUTANEOUS | Status: DC
  Administered 2020-10-10 – 2020-10-12 (×3): 6 via TOPICAL

## 2020-10-09 MED ORDER — ACETAMINOPHEN 325 MG PO TABS: 650.0000 mg | ORAL_TABLET | Freq: Four times a day (QID) | ORAL | Status: DC | PRN

## 2020-10-09 MED ORDER — ASCORBIC ACID 500 MG PO TABS
500.0000 mg | ORAL_TABLET | Freq: Every day | ORAL | Status: DC
  Administered 2020-10-10 – 2020-10-12 (×3): 500 mg via ORAL
  Filled 2020-10-09 (×3): qty 1

## 2020-10-09 MED ORDER — OMEGA-3-ACID ETHYL ESTERS 1 G PO CAPS
1.0000 g | ORAL_CAPSULE | Freq: Every day | ORAL | Status: DC
  Administered 2020-10-10 – 2020-10-11 (×2): 1 g via ORAL
  Filled 2020-10-09 (×4): qty 1

## 2020-10-09 MED ORDER — ACETAMINOPHEN 650 MG RE SUPP: 650.0000 mg | Freq: Four times a day (QID) | RECTAL | Status: DC | PRN

## 2020-10-09 MED ORDER — ENOXAPARIN SODIUM 40 MG/0.4ML IJ SOSY
40.0000 mg | PREFILLED_SYRINGE | INTRAMUSCULAR | Status: DC
  Administered 2020-10-10 – 2020-10-12 (×3): 40 mg via SUBCUTANEOUS
  Filled 2020-10-09 (×3): qty 0.4

## 2020-10-09 MED ORDER — LACTATED RINGERS IV BOLUS (SEPSIS)
1000.0000 mL | Freq: Once | INTRAVENOUS | Status: AC
  Administered 2020-10-09: 1000 mL via INTRAVENOUS

## 2020-10-09 MED ORDER — SODIUM CHLORIDE 0.9 % IV SOLN
1.0000 g | INTRAVENOUS | Status: DC
  Administered 2020-10-10: 1 g via INTRAVENOUS
  Filled 2020-10-09: qty 1

## 2020-10-09 MED ORDER — METOPROLOL TARTRATE 25 MG PO TABS
12.5000 mg | ORAL_TABLET | Freq: Two times a day (BID) | ORAL | Status: DC
  Administered 2020-10-09 – 2020-10-12 (×6): 12.5 mg via ORAL
  Filled 2020-10-09 (×6): qty 1

## 2020-10-09 MED ORDER — ONDANSETRON HCL 4 MG/2ML IJ SOLN: 4.0000 mg | Freq: Four times a day (QID) | INTRAMUSCULAR | Status: DC | PRN

## 2020-10-09 MED ORDER — SODIUM CHLORIDE 0.9 % IV SOLN
1.0000 g | INTRAVENOUS | Status: DC
  Administered 2020-10-09: 1 g via INTRAVENOUS
  Filled 2020-10-09: qty 10

## 2020-10-09 MED ORDER — FERROUS SULFATE 325 (65 FE) MG PO TABS
325.0000 mg | ORAL_TABLET | Freq: Every day | ORAL | Status: DC
  Administered 2020-10-10 – 2020-10-12 (×3): 325 mg via ORAL
  Filled 2020-10-09 (×3): qty 1

## 2020-10-09 MED ORDER — IPRATROPIUM-ALBUTEROL 0.5-2.5 (3) MG/3ML IN SOLN: 3.0000 mL | Freq: Four times a day (QID) | RESPIRATORY_TRACT | Status: DC | PRN

## 2020-10-09 MED ORDER — ONDANSETRON HCL 4 MG PO TABS: 4.0000 mg | ORAL_TABLET | Freq: Four times a day (QID) | ORAL | Status: DC | PRN

## 2020-10-09 MED ORDER — PRAVASTATIN SODIUM 20 MG PO TABS
40.0000 mg | ORAL_TABLET | Freq: Every day | ORAL | Status: DC
  Administered 2020-10-09 – 2020-10-11 (×3): 40 mg via ORAL
  Filled 2020-10-09 (×3): qty 2
  Filled 2020-10-09: qty 1

## 2020-10-09 MED ORDER — SODIUM ZIRCONIUM CYCLOSILICATE 10 G PO PACK
10.0000 g | PACK | Freq: Once | ORAL | Status: DC
  Filled 2020-10-09 (×2): qty 1

## 2020-10-09 MED ORDER — INSULIN ASPART 100 UNIT/ML IJ SOLN
0.0000 [IU] | Freq: Three times a day (TID) | INTRAMUSCULAR | Status: DC
  Administered 2020-10-10 – 2020-10-11 (×6): 3 [IU] via SUBCUTANEOUS
  Administered 2020-10-12 (×2): 5 [IU] via SUBCUTANEOUS
  Filled 2020-10-09 (×7): qty 1

## 2020-10-09 NOTE — ED Provider Notes (Signed)
[facility] Emergency Department Provider Note   ____________________________________________   Event Date/Time   First MD Initiated Contact with Patient 10/09/20 [phone]     (approximate)  I have reviewed the triage vital signs and the nursing notes.   HISTORY  Chief Complaint Altered Mental Status  Level V caveat: Limited by decreased LOC  HPI Natalie Adams is a 87 y.o. female brought to the ED via EMS from home with a chief complaint of decreased LOC.  Patient with a history of hypertension, diabetes, morbid obesity whose daughter checked on her to change her diaper this morning and found patient with decreased LOC.  Last known in her usual state of health around 10 PM.  Daughter told EMS patient has not been eating or drinking, patient uses a Hoyer lift to get her up out of bed and is able to ambulate somewhat.  Daughter also noted a mass on patient's lower abdomen.  Daughter denies recent fever, cough, shortness of breath, nausea or vomiting.     Past Medical History:  Diagnosis Date   Arthritis    Asthma    Diabetes mellitus without complication (HCC)    Hypertension    Morbid obesity (HCC)     Patient Active Problem List   Diagnosis Date Noted   Sepsis (HCC) 10/09/2020   Fracture of distal femur (HCC) 06/05/2020   Accidental fall from bed, initial encounter 06/05/2020   Preoperative clearance 06/05/2020   Femur fracture, left (HCC) 06/05/2020   Pulmonary hypertension (HCC) 07/21/2017   Aortic stenosis 07/21/2017   Bilateral hearing loss 01/24/2015   Hypertension, benign 04/30/2010   Type II diabetes mellitus (HCC) 04/30/2010    Past Surgical History:  Procedure Laterality Date   CATARACT EXTRACTION, BILATERAL     ORIF FEMUR FRACTURE Left 06/05/2020   Procedure: OPEN REDUCTION INTERNAL FIXATION (ORIF) DISTAL FEMUR FRACTURE;  Surgeon: Christena Flake, MD;  Location: [facility] ORS;  Service: Orthopedics;  Laterality: Left;    Prior to Admission  medications   Medication Sig Start Date End Date Taking? Authorizing Provider  acetaminophen (TYLENOL) 500 MG tablet Take 2 tablets (1,000 mg total) by mouth 2 (two) times daily as needed for mild pain or moderate pain. 06/09/20   Darlin Priestly, MD  amLODipine (NORVASC) 10 MG tablet Take by mouth. 05/01/17   [provider]  ascorbic acid (VITAMIN C) 1000 MG tablet Take by mouth.    [provider]  Cholecalciferol 125 MCG (5000 UT) TABS Take 1 tablet by mouth daily.    [provider]  Dietary Management Product (ALGESIS) TABS Take by mouth.    [provider]  Ferrous Fumarate (IRON) 18 MG TBCR Take 1 tablet (18 mg total) by mouth daily. Can take any over-the-counter iron supplement. 06/09/20   Darlin Priestly, MD  insulin lispro (HUMALOG) 100 UNIT/ML injection Sliding scale insulin # of units = (BS-150/50) TID [facility] 05/09/16   [provider]  insulin NPH Human (NOVOLIN N) 100 UNIT/ML injection Inject 15 Units into the skin daily before breakfast. 05/09/16   [provider]  ipratropium-albuterol (DUONEB) 0.5-2.5 (3) MG/3ML SOLN SMARTSIG:1 Ampule(s) Via Nebulizer Every 6 Hours PRN 03/25/20   [provider]  metFORMIN (GLUCOPHAGE) 1000 MG tablet Take by mouth in the morning and at bedtime. 05/08/20   [provider]  metoprolol tartrate (LOPRESSOR) 25 MG tablet Take 12.5 mg by mouth 2 (two) times daily. 05/08/20   [provider]  Multiple Vitamins tablet Take by mouth.  02/11/11   [provider]  Omega-3 Fatty Acids (FISH OIL) 1200 MG CAPS Take by mouth.    [provider]  pravastatin (PRAVACHOL) 40 MG tablet Take 40 mg by mouth at bedtime. 05/08/20   [provider]    Allergies Amoxicillin-pot clavulanate and Hydrochlorothiazide  History reviewed. No pertinent family history.  Social History Social History   Tobacco Use   Smoking status: Never   Smokeless tobacco: Never    Review of  Systems  Constitutional: Positive for decreased oral intake.  No fever/chills Eyes: No visual changes. ENT: No sore throat. Cardiovascular: Denies chest pain. Respiratory: Denies shortness of breath. Gastrointestinal: Positive for abdominal mass.  No abdominal pain.  No nausea, no vomiting.  No diarrhea.  No constipation. Genitourinary: Negative for dysuria. Musculoskeletal: Negative for back pain. Skin: Negative for rash. Neurological: Positive for decreased LOC.  Negative for headaches, focal weakness or numbness.   ____________________________________________   PHYSICAL EXAM:  VITAL SIGNS: ED Triage Vitals  Enc Vitals Group     BP --      Pulse Rate 10/09/20 0452 (!) 113     Resp 10/09/20 0452 18     Temp 10/09/20 0452 98.7 F (37.1 C)     Temp Source 10/09/20 0452 Oral     SpO2 10/09/20 0452 95 %     Weight 10/09/20 0453 200 lb (90.7 kg)     Height 10/09/20 0453 5\' 7"  (1.702 m)     Head Circumference --      Peak Flow --      Pain Score 10/09/20 0453 0     Pain Loc --      Pain Edu? --      Excl. in GC? --     Constitutional: Decreased LOC.  Elderly appearing and in mild acute distress. Eyes: Conjunctivae are normal. PERRL. EOMI. Head: Atraumatic. Nose: Atraumatic. Mouth/Throat: Mucous membranes are dry. Neck: No stridor.   Cardiovascular: Tachycardic rate, regular rhythm. Grossly normal heart sounds.  Good peripheral circulation. Respiratory: Normal respiratory effort.  No retractions. Lungs CTAB. Gastrointestinal: Soft with mild diffuse tenderness to palpation without rebound or guarding.  Firm mass felt in lower abdomen. No distention. No abdominal bruits. No CVA tenderness. Musculoskeletal: No lower extremity tenderness nor edema.  No joint effusions. Neurologic: Responds to painful stimuli.  Unable to follow simple commands.  MAEx4. Skin:  Skin is warm, dry and intact. No rash noted.  No petechiae. Psychiatric: Unable to  assess.  ____________________________________________   LABS (all labs ordered are listed, but only abnormal results are displayed)  Labs Reviewed  LACTIC ACID, PLASMA - Abnormal; Notable for the following components:      Result Value   Lactic Acid, Venous 2.2 (*)    All other components within normal limits  CBC WITH DIFFERENTIAL/PLATELET - Abnormal; Notable for the following components:   WBC 15.3 (*)    RBC 6.48 (*)    MCV 63.0 (*)    MCH 19.9 (*)    RDW 18.9 (*)    Neutro Abs 12.2 (*)    Monocytes Absolute 1.5 (*)    All other components within normal limits  COMPREHENSIVE METABOLIC PANEL - Abnormal; Notable for the following components:   Sodium 133 (*)    Potassium 5.5 (*)    Glucose, Bld 188 (*)    BUN 27 (*)    Creatinine, Ser 1.24 (*)    GFR, Estimated 42 (*)    All other components within normal limits  URINALYSIS, COMPLETE (UACMP) WITH MICROSCOPIC - Abnormal; Notable for the following components:   Color, Urine YELLOW (*)    APPearance TURBID (*)    Glucose, UA >=500 (*)    Hgb urine dipstick SMALL (*)    Ketones, ur 5 (*)    Protein, ur 100 (*)    Leukocytes,Ua SMALL (*)    WBC, UA >50 (*)    Bacteria, UA FEW (*)    All other components within normal limits  CBG MONITORING, ED - Abnormal; Notable for the following components:   Glucose-Capillary 192 (*)    All other components within normal limits  TROPONIN I (HIGH SENSITIVITY) - Abnormal; Notable for the following components:   Troponin I (High Sensitivity) 31 (*)    All other components within normal limits  RESP PANEL BY RT-PCR (FLU A&B, COVID) ARPGX2  CULTURE, BLOOD (ROUTINE X 2)  CULTURE, BLOOD (ROUTINE X 2)  URINE CULTURE  LIPASE, BLOOD  LACTIC ACID, PLASMA  TROPONIN I (HIGH SENSITIVITY)   ____________________________________________  EKG  ED ECG REPORT I, Gal Smolinski J, the attending physician, personally viewed and interpreted this ECG.   Date: 10/09/2020  EKG Time: 0453  Rate: 114   Rhythm: sinus tachycardia  Axis: Normal  Intervals:none  ST&T Change: Nonspecific  ____________________________________________  RADIOLOGY I, Maguire Sime J, personally viewed and evaluated these images (plain radiographs) as part of my medical decision making, as well as reviewing the written report by the radiologist.  ED MD interpretation: No acute cardiopulmonary process; No ICH, perinephric and pelvic edema, cholelithiasis without cholecystitis on CT abdomen/pelvis  Official radiology report(s): CT Abdomen Pelvis Wo Contrast  Result Date: 10/09/2020 CLINICAL DATA:  Unresponsive.  New lower abdominal mass. EXAM: CT ABDOMEN AND PELVIS WITHOUT CONTRAST TECHNIQUE: Multidetector CT imaging of the abdomen and pelvis was performed following the standard protocol without IV contrast. COMPARISON:  None. FINDINGS: Lower chest: Aortic valve calcification. Cardiomegaly and atherosclerosis. Dependent atelectasis or scarring. Hepatobiliary: No focal liver abnormality.At least 1 calcified gallstone. No right upper quadrant inflammation. Pancreas: Unremarkable. Spleen: Unremarkable. Adrenals/Urinary Tract: Negative adrenals. Pelvicaliectasis bilaterally with accentuated perinephric stranding that is symmetric. Question recently relieved urinary retention. The bladder is decompressed completely by a Foley catheter. Nondescript/generalize pelvic fat edema. Stomach/Bowel:  No obstruction. No visible bowel inflammation Vascular/Lymphatic: No acute vascular abnormality. Diffuse atheromatous calcification of the aorta and iliacs. No mass or adenopathy. Reproductive:No pathologic findings. Other: No ascites or pneumoperitoneum. Musculoskeletal: No acute abnormalities. Osteopenia and generalized spinal degeneration with mild scoliosis. IMPRESSION: 1. Pelvic and perinephric edema, question recently relieved urinary retention. 2. Cholelithiasis without cholecystitis. 3. Atherosclerosis. 4. Prominent aortic valve  calcification. Electronically Signed   By: Marnee Spring M.D.   On: 10/09/2020 07:07   CT Head Wo Contrast  Result Date: 10/09/2020 CLINICAL DATA:  Mental status change.  Unresponsive. EXAM: CT HEAD WITHOUT CONTRAST TECHNIQUE: Contiguous axial images were obtained from the base of the skull through the vertex without intravenous contrast. COMPARISON:  Head CT report from 07/20/2017 at [facility] FINDINGS: Brain: Cystic structure in the anterior left frontal region measuring up to 4.7 cm. There appears to be gray matter along the medial and posterior aspect of this cyst, presumably extra-axial. 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 Possible prior lacune in the left pons. Generalized brain atrophy. Chronic small vessel ischemia in the deep cerebral white matter. Vascular: Atheromatous calcification. Skull: Hyperostosis interna.  Negative for fracture or bone lesion. Sinuses/Orbits: Chronic right sphenoid sinusitis with sclerotic wall thickening and subtotal  opacification. IMPRESSION: 1. No acute finding. 2. Chronic/senescent changes are described above Electronically Signed   By: Marnee Spring M.D.   On: 10/09/2020 06:56   DG Chest Port 1 View  Result Date: 10/09/2020 CLINICAL DATA:  Decreased level of consciousness. EXAM: PORTABLE CHEST 1 VIEW COMPARISON:  06/05/2020 FINDINGS: Chronic cardiomegaly. Stable mediastinal contours. Mitral annular calcification. There is a ovoid density at the left base which is likely calcification and will be seen on pending abdominal CT. No edema, effusion, or air bronchogram. IMPRESSION: No suspected cardiopulmonary disease. Electronically Signed   By: Marnee Spring M.D.   On: 10/09/2020 05:38    ____________________________________________   PROCEDURES  Procedure(s) performed (including Critical Care):  .1-3 Lead EKG Interpretation  Date/Time: 10/09/2020 5:05 AM Performed by: Irean Hong, MD Authorized by: Irean Hong, MD      Interpretation: abnormal     ECG rate:  114   ECG rate assessment: tachycardic     Rhythm: sinus tachycardia     Ectopy: none     Conduction: normal   Comments:     Patient placed on cardiac monitor to evaluate for arrhythmias   CRITICAL CARE Performed by: Irean Hong   Total critical care time: 45 minutes  Critical care time was exclusive of separately billable procedures and treating other patients.  Critical care was necessary to treat or prevent imminent or life-threatening deterioration.  Critical care was time spent personally by me on the following activities: development of treatment plan with patient and/or surrogate as well as nursing, discussions with consultants, evaluation of patient's response to treatment, examination of patient, obtaining history from patient or surrogate, ordering and performing treatments and interventions, ordering and review of laboratory studies, ordering and review of radiographic studies, pulse oximetry and re-evaluation of patient's condition.  ____________________________________________   INITIAL IMPRESSION / ASSESSMENT AND PLAN / ED COURSE  As part of my medical decision making, I reviewed the following data within the electronic MEDICAL RECORD NUMBER Nursing notes reviewed and incorporated, Labs reviewed, EKG interpreted, Old chart reviewed, Radiograph reviewed, Discussed with admitting physician, and Notes from prior ED visits     87 year old female brought to the ED for decreased LOC Differential diagnosis includes, but is not limited to, alcohol, illicit or prescription medications, or other toxic ingestion; intracranial pathology such as stroke or intracerebral hemorrhage; fever or infectious causes including sepsis; hypoxemia and/or hypercarbia; uremia; trauma; endocrine related disorders such as diabetes, hypoglycemia, and thyroid-related diseases; hypertensive encephalopathy; etc.   Will obtain sepsis work-up, chest x-ray.  CT head and  abdomen/pelvis.  Bladder scan to determine if lower abdominal mass is enlarged bladder from urinary retention.  Obtain rectal temperature.  Anticipate hospitalization.  Clinical Course as of 10/09/20 0733  Fri Oct 09, 2020  7124 Bladder scan reveals >546mL after in/out cath.  Will place Foley catheter.  I personally visualized the urine specimen which is milky in consistency and tan in color.  Strongly suspect UTI. [JS]  0558 Elevated lactic acid noted.  Will initiate code sepsis. [JS]  [id number] Foley catheter placed.  Reexamined abdomen.  Palpable mass has resolved but mild diffuse tenderness remains. [JS]  [id number] Rectal temperature 100 F. [JS]  0732 Discussed with hospitalist services for admission. [JS]    Clinical Course User Index [JS] Irean Hong, MD     ____________________________________________   FINAL CLINICAL IMPRESSION(S) / ED DIAGNOSES  Final diagnoses:  Altered mental status, unspecified altered mental status type  Dehydration  AKI (acute kidney  injury) (HCC)  Sepsis, due to unspecified organism, unspecified whether acute organ dysfunction present [facility](HCC)  Urinary tract infection without hematuria, site unspecified     ED Discharge Orders     None        Note:  This document was prepared using Dragon voice recognition software and may include unintentional dictation errors.    Irean HongSung, Eman Rynders J, MD 10/09/20 [phone]

## 2020-10-09 NOTE — Consult Note (Signed)
CODE SEPSIS - PHARMACY COMMUNICATION  **Broad Spectrum Antibiotics should be administered within 1 hour of Sepsis diagnosis**  Time Code Sepsis Called/Page Received: 7096  Antibiotics Ordered: 0558  Time of 1st antibiotic administration: 0616  Additional action taken by pharmacy: N/A  If necessary, Name of Provider/Nurse Contacted: N/A    Derrek Gu ,PharmD Clinical Pharmacist  10/09/2020  8:52 AM

## 2020-10-09 NOTE — Progress Notes (Signed)
Code Sepsis initiated @ 0557 AM, EL[facility] following.

## 2020-10-09 NOTE — ED Triage Notes (Signed)
Pt came from home; daughter called ems daughter said she wasnt responsive this morning; lkw before 10pm 10/08/20; pt uses a hoyer lift; pt has new mass on lower abd medial; 169/85 BP; cbg 225; daughter reports pt has not been eating or drinkning;

## 2020-10-09 NOTE — Progress Notes (Signed)
Pt arrival to unit. Chart and orders reviewed. Tele monitor applied, VSS. Pt disoriented x4. Foley draining clear yellow urine. Whiteboard updated, admission assessment complete.

## 2020-10-09 NOTE — ED Notes (Signed)
Pt was in and out cathed; pt urine was cloudy milky and thick urine. Will put a foley catheter in place. Purewick in place.

## 2020-10-09 NOTE — H&P (Addendum)
History and Physical    Natalie Adams DOA: 10/09/2020  PCP: [facility]   Patient coming from: Home  I have personally briefly reviewed patient's old medical records [facility]  Chief Complaint: Change in mental status Most of the history was obtained from patient's daughter over the phone.  HPI: Natalie Adams is a 87 y.o. female with medical history significant for  Natalie Adams is a 87 y.o. female with medical history significant for HTN, DM, bilateral hearing loss, mild aortic stenosis and pulmonary hypertension who was brought into the ER by EMS for evaluation of mental status changes. She states that her mother had an episode of emesis during the day as well as a bowel movement and had gone to bed but in the early hours of the morning she noticed that she was less responsive and also noticed that she had distention of her lower abdomen.  This concerned her and so she called EMS who brought patient to the ER. I am unable to do review of systems on this patient due to her mental status. Labs show sodium 133, potassium 5.5, chloride 100, bicarb 22, glucose 188, BUN 27, creatinine 1.24, calcium 9.6, alkaline phosphatase 103, albumin 3.5, lipase 22, AST 36, ALT 21, total protein 8.1, total bilirubin 1.2, lactic acid 2.2 >> 2.7, white count 15.3, hemoglobin 12.9, hematocrit 40.8, MCV 63, RDW 18.9, platelet count 336 Respiratory viral panel is negative Urine analysis shows pyuria Chest x-ray reviewed by me shows no evidence of acute cardiopulmonary disease CT scan of abdomen and pelvis shows pelvic and perinephric edema, question recently relieved urinary retention. Cholelithiasis without cholecystitis. Atherosclerosis. Prominent aortic valve calcification. CT scan of the head without contrast shows no acute findings. Twelve-lead EKG reviewed by me shows sinus tachycardia  ED Course: Patient is an 87 year old female who was brought into  the ER for evaluation of decreased responsiveness. She was found to have acute urinary retention and a Foley catheter was inserted with drainage of 596 mL of urine.  Patient also appears septic as evidenced by pyuria, lactic acidosis, leukocytosis, tachycardia and tachypnea. She will be admitted to the hospital for further evaluation.    Review of Systems: As per HPI otherwise all other systems reviewed and negative.    Past Medical History:  Diagnosis Date   Arthritis    Asthma    Diabetes mellitus without complication (HCC)    Hypertension    Morbid obesity (HCC)     Past Surgical History:  Procedure Laterality Date   CATARACT EXTRACTION, BILATERAL     ORIF FEMUR FRACTURE Left 06/05/2020   Procedure: OPEN REDUCTION INTERNAL FIXATION (ORIF) DISTAL FEMUR FRACTURE;  Surgeon: Christena FlakePoggi, John J, MD;  Location: [facility] ORS;  Service: Orthopedics;  Laterality: Left;     reports that she has never smoked. She has never used smokeless tobacco. No history on file for alcohol use and drug use.  Allergies  Allergen Reactions   Amoxicillin-Pot Clavulanate Diarrhea   Hydrochlorothiazide Other (See Comments)    "she got shaky and fell" "she got shaky and fell"     Family History  Problem Relation Age of Onset   Hypertension Mother       Prior to Admission medications   Medication Sig Start Date End Date Taking? Authorizing Provider  acetaminophen (TYLENOL) 500 MG tablet Take 2 tablets (1,000 mg total) by mouth 2 (two) times daily as needed for mild pain or moderate pain. 06/09/20  Yes Darlin PriestlyLai, Tina, MD  amLODipine (NORVASC) 10 MG tablet Take 5 mg by mouth daily. 05/01/17  Yes [provider]  ascorbic acid (VITAMIN C) 1000 MG tablet Take by mouth.   Yes [provider]  Cholecalciferol 125 MCG (5000 UT) TABS Take 1 tablet by mouth daily.   Yes [provider]  Dietary Management Product (ALGESIS) TABS Take by mouth.   Yes [provider]  Ferrous Fumarate  (IRON) 18 MG TBCR Take 1 tablet (18 mg total) by mouth daily. Can take any over-the-counter iron supplement. 06/09/20  Yes Darlin Priestly, MD  insulin lispro (HUMALOG) 100 UNIT/ML injection Sliding scale insulin # of units = (BS-150/50) TID [facility] 05/09/16  Yes [provider]  insulin NPH Human (NOVOLIN N) 100 UNIT/ML injection Inject 14 Units into the skin daily before breakfast. 05/09/16  Yes [provider]  ipratropium-albuterol (DUONEB) 0.5-2.5 (3) MG/3ML SOLN SMARTSIG:1 Ampule(s) Via Nebulizer Every 6 Hours PRN 03/25/20  Yes [provider]  metFORMIN (GLUCOPHAGE) 1000 MG tablet Take 1,000 mg by mouth in the morning and at bedtime. 05/08/20  Yes [provider]  metoprolol tartrate (LOPRESSOR) 25 MG tablet Take 12.5 mg by mouth 2 (two) times daily. 05/08/20  Yes [provider]  Multiple Vitamins tablet Take by mouth. 02/11/11  Yes [provider]  Omega-3 Fatty Acids (FISH OIL) 1200 MG CAPS Take by mouth.   Yes [provider]  pravastatin (PRAVACHOL) 40 MG tablet Take 40 mg by mouth at bedtime. 05/08/20  Yes [provider]    Physical Exam: Vitals:   10/09/20 0453 10/09/20 0600 10/09/20 0619 10/09/20 0703  BP:  122/72  (!) 142/73  Pulse:  (!) 104  96  Resp:  (!) 28  (!) 22  Temp:   100 F (37.8 C)   TempSrc:   Rectal   SpO2:  94%  95%  Weight: 90.7 kg     Height: 5\' 7"  (1.702 m)        Vitals:   10/09/20 0453 10/09/20 0600 10/09/20 0619 10/09/20 0703  BP:  122/72  (!) 142/73  Pulse:  (!) 104  96  Resp:  (!) 28  (!) 22  Temp:   100 F (37.8 C)   TempSrc:   Rectal   SpO2:  94%  95%  Weight: 90.7 kg     Height: 5\' 7"  (1.702 m)         Constitutional: Alert and oriented only to person . Not in any apparent distress HEENT:      Head: Normocephalic and atraumatic.         Eyes: PERLA, EOMI, Conjunctivae are normal. Sclera is non-icteric.       Mouth/Throat: Mucous membranes are dry       Neck: Supple with no signs of  meningismus. Cardiovascular: Tachycardic.  Systolic ejection murmurs, gallops, or rubs. 2+ symmetrical distal pulses are present . No JVD. No LE edema Respiratory: Respiratory effort normal .Lungs sounds clear bilaterally. No wheezes, crackles, or rhonchi.  Gastrointestinal: Soft, non tender, and non distended with positive bowel sounds.  Genitourinary: No CVA tenderness. Musculoskeletal: Nontender with normal range of motion in all extremities. No cyanosis, or erythema of extremities. Neurologic:  Face is symmetric. Moving all extremities. No gross focal neurologic deficits . Skin: Skin is warm, dry.  No rash or ulcers Psychiatric: Mood and affect are normal    Labs on Admission: I have personally reviewed following labs and imaging studies  CBC: Recent Labs  Lab 10/09/20 0510  WBC 15.3*  NEUTROABS 12.2*  HGB 12.9  HCT 40.8  MCV 63.0*  PLT 336   Basic Metabolic Panel: Recent Labs  Lab 10/09/20 0510  NA 133*  K 5.5*  CL 100  CO2 22  GLUCOSE 188*  BUN 27*  CREATININE 1.24*  CALCIUM 9.6   GFR: Estimated Creatinine Clearance: 36.2 mL/min (A) (by C-G formula based on SCr of 1.24 mg/dL (H)). Liver Function Tests: Recent Labs  Lab 10/09/20 0510  AST 36  ALT 21  ALKPHOS 103  BILITOT 1.2  PROT 8.1  ALBUMIN 3.5   Recent Labs  Lab 10/09/20 0510  LIPASE 22   No results for input(s): AMMONIA in the last 168 hours. Coagulation Profile: No results for input(s): INR, PROTIME in the last 168 hours. Cardiac Enzymes: No results for input(s): CKTOTAL, CKMB, CKMBINDEX, TROPONINI in the last 168 hours. BNP (last 3 results) No results for input(s): PROBNP in the last 8760 hours. HbA1C: No results for input(s): HGBA1C in the last 72 hours. CBG: Recent Labs  Lab 10/09/20 0450  GLUCAP 192*   Lipid Profile: No results for input(s): CHOL, HDL, LDLCALC, TRIG, CHOLHDL, LDLDIRECT in the last 72 hours. Thyroid Function Tests: No results for input(s): TSH, T4TOTAL, FREET4,  T3FREE, THYROIDAB in the last 72 hours. Anemia Panel: No results for input(s): VITAMINB12, FOLATE, FERRITIN, TIBC, IRON, RETICCTPCT in the last 72 hours. Urine analysis:    Component Value Date/Time   COLORURINE YELLOW (A) 10/09/2020 0510   APPEARANCEUR TURBID (A) 10/09/2020 0510   LABSPEC 1.015 10/09/2020 0510   PHURINE 5.0 10/09/2020 0510   GLUCOSEU >=500 (A) 10/09/2020 0510   HGBUR SMALL (A) 10/09/2020 0510   BILIRUBINUR NEGATIVE 10/09/2020 0510   KETONESUR 5 (A) 10/09/2020 0510   PROTEINUR 100 (A) 10/09/2020 0510   NITRITE NEGATIVE 10/09/2020 0510   LEUKOCYTESUR SMALL (A) 10/09/2020 0510    Radiological Exams on Admission: CT Abdomen Pelvis Wo Contrast  Result Date: 10/09/2020 CLINICAL DATA:  Unresponsive.  New lower abdominal mass. EXAM: CT ABDOMEN AND PELVIS WITHOUT CONTRAST TECHNIQUE: Multidetector CT imaging of the abdomen and pelvis was performed following the standard protocol without IV contrast. COMPARISON:  None. FINDINGS: Lower chest: Aortic valve calcification. Cardiomegaly and atherosclerosis. Dependent atelectasis or scarring. Hepatobiliary: No focal liver abnormality.At least 1 calcified gallstone. No right upper quadrant inflammation. Pancreas: Unremarkable. Spleen: Unremarkable. Adrenals/Urinary Tract: Negative adrenals. Pelvicaliectasis bilaterally with accentuated perinephric stranding that is symmetric. Question recently relieved urinary retention. The bladder is decompressed completely by a Foley catheter. Nondescript/generalize pelvic fat edema. Stomach/Bowel:  No obstruction. No visible bowel inflammation Vascular/Lymphatic: No acute vascular abnormality. Diffuse atheromatous calcification of the aorta and iliacs. No mass or adenopathy. Reproductive:No pathologic findings. Other: No ascites or pneumoperitoneum. Musculoskeletal: No acute abnormalities. Osteopenia and generalized spinal degeneration with mild scoliosis. IMPRESSION: 1. Pelvic and perinephric edema,  question recently relieved urinary retention. 2. Cholelithiasis without cholecystitis. 3. Atherosclerosis. 4. Prominent aortic valve calcification. Electronically Signed   By: Marnee Spring M.D.   On: 10/09/2020 07:07   CT Head Wo Contrast  Result Date: 10/09/2020 CLINICAL DATA:  Mental status change.  Unresponsive. EXAM: CT HEAD WITHOUT CONTRAST TECHNIQUE: Contiguous axial images were obtained from the base of the skull through the vertex without intravenous contrast. COMPARISON:  Head CT report from 07/20/2017 at [facility] FINDINGS: Brain: Cystic structure in the anterior left frontal region measuring up to 4.7 cm. There appears to be gray matter along the medial and posterior aspect of this cyst, presumably extra-axial.  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 Possible prior lacune in the left pons. Generalized brain atrophy. Chronic small vessel ischemia in the deep cerebral white matter. Vascular: Atheromatous calcification. Skull: Hyperostosis interna.  Negative for fracture or bone lesion. Sinuses/Orbits: Chronic right sphenoid sinusitis with sclerotic wall thickening and subtotal opacification. IMPRESSION: 1. No acute finding. 2. Chronic/senescent changes are described above Electronically Signed   By: Marnee Spring M.D.   On: 10/09/2020 06:56   DG Chest Port 1 View  Result Date: 10/09/2020 CLINICAL DATA:  Decreased level of consciousness. EXAM: PORTABLE CHEST 1 VIEW COMPARISON:  06/05/2020 FINDINGS: Chronic cardiomegaly. Stable mediastinal contours. Mitral annular calcification. There is a ovoid density at the left base which is likely calcification and will be seen on pending abdominal CT. No edema, effusion, or air bronchogram. IMPRESSION: No suspected cardiopulmonary disease. Electronically Signed   By: Marnee Spring M.D.   On: 10/09/2020 05:38     Assessment/Plan Principal Problem:   Sepsis secondary to UTI [facility]) Active Problems:   Hypertension   Diabetes  mellitus without complication (HCC)   Pulmonary hypertension (HCC)   Bilateral hearing loss   Aortic stenosis   AKI (acute kidney injury) (HCC)   Hyperkalemia   Acute urinary retention    Sepsis secondary to UTI with Patient presents for evaluation of decreased level of responsiveness and was found to have acute urinary retention requiring insertion of Foley catheter with drainage of 596 mL of urine She is septic as evidenced by tachycardia, tachypnea, marked leukocytosis, lactic acidosis and pyuria We will treat patient empirically with Rocephin 1 g IV daily Follow-up results of urine culture Judicious IV fluid resuscitation Trend lactic acid levels     Acute kidney injury with hyperkalemia Most likely prerenal related to poor oral intake as well as from GI losses related to emesis At baseline patient presents with creatinine of 0.73 and on admission today it is 1.24 with mild hyperkalemia 5.5 Continue IV fluid hydration We will give a one-time dose of Lokelma Repeat renal parameters in a.m.       Hypertension Continue metoprolol Hold amlodipine for now       Acute urinary retention Required Foley catheter insertion with drainage of 596 mL of urine Voiding trial prior to discharge  DVT prophylaxis: Lovenox  Code Status: DNR Family Communication: Greater than 50% of time was spent discussing patient's condition and plan of care with her daughter, Natalie Adams over the phone.  All questions and concerns have been addressed.  She verbalizes understanding and agrees with the plan. Disposition Plan: Back to previous home environment Consults called: none  Status: At the time of admission, it appears that the appropriate admission status for this patient is inpatient. This is judged to be reasonable and necessary in order to provide the required intensity of service to ensure the patient's safety given the presenting symptoms, physical exam findings and initial  radiographic and laboratory data in the context of their comorbid conditions. Patient requires inpatient status due to high intensity of service, high risk for further deterioration and high frequency of surveillance required.    Lucile Shutters MD [facility]     10/09/2020, 9:06 AM

## 2020-10-09 NOTE — ED Notes (Signed)
Bladder scan showed retained after I&O cath. Provider aware. Orders received to place foley catheter.

## 2020-10-10 DIAGNOSIS — R338 Other retention of urine: Secondary | ICD-10-CM

## 2020-10-10 LAB — GLUCOSE, CAPILLARY
Glucose-Capillary: 190 mg/dL — ABNORMAL HIGH (ref 70–99)
Glucose-Capillary: 171 mg/dL — ABNORMAL HIGH (ref 70–99)
Glucose-Capillary: 160 mg/dL — ABNORMAL HIGH (ref 70–99)
Glucose-Capillary: 152 mg/dL — ABNORMAL HIGH (ref 70–99)

## 2020-10-10 LAB — BLOOD CULTURE ID PANEL (REFLEXED) - BCID2

## 2020-10-10 LAB — BASIC METABOLIC PANEL
Anion gap: 8 (ref 5–15)
BUN: 18 mg/dL (ref 8–23)
CO2: 25 mmol/L (ref 22–32)
Calcium: 8.7 mg/dL — ABNORMAL LOW (ref 8.9–10.3)
Chloride: 103 mmol/L (ref 98–111)
Creatinine, Ser: 0.74 mg/dL (ref 0.44–1.00)
GFR, Estimated: >60 mL/min (ref >60)
Glucose, Bld: 192 mg/dL — ABNORMAL HIGH (ref 70–99)
Potassium: 3.6 mmol/L (ref 3.5–5.1)
Sodium: 136 mmol/L (ref 135–145)

## 2020-10-10 LAB — CBC
HCT: 30.7 % — ABNORMAL LOW (ref 36.0–46.0)
Hemoglobin: 9.6 g/dL — ABNORMAL LOW (ref 12.0–15.0)
MCH: 19.4 pg — ABNORMAL LOW (ref 26.0–34.0)
MCHC: 31.3 g/dL (ref 30.0–36.0)
MCV: 62 fL — ABNORMAL LOW (ref 80.0–100.0)
Platelets: 243 10*3/uL (ref 150–400)
RBC: 4.95 MIL/uL (ref 3.87–5.11)
RDW: 16.7 % — ABNORMAL HIGH (ref 11.5–15.5)
WBC: 12 10*3/uL — ABNORMAL HIGH (ref 4.0–10.5)
nRBC: 0 % (ref 0.0–0.2)

## 2020-10-10 LAB — PROCALCITONIN: Procalcitonin: 0.55 ng/mL

## 2020-10-10 LAB — PROTIME-INR
INR: 1.2 (ref 0.8–1.2)
Prothrombin Time: 15.1 seconds (ref 11.4–15.2)

## 2020-10-10 LAB — CORTISOL-AM, BLOOD: Cortisol - AM: 11.2 ug/dL (ref 6.7–22.6)

## 2020-10-10 MED ORDER — SODIUM CHLORIDE 0.9 % IV SOLN
2.0000 g | INTRAVENOUS | Status: DC
  Filled 2020-10-10: qty 20

## 2020-10-10 MED ORDER — SODIUM CHLORIDE 0.9 % IV SOLN
INTRAVENOUS | Status: DC | PRN
  Administered 2020-10-10 (×2): 250 mL via INTRAVENOUS

## 2020-10-10 MED ORDER — SODIUM CHLORIDE 0.9 % IV SOLN
1.0000 g | Freq: Once | INTRAVENOUS | Status: AC
  Administered 2020-10-10: 1 g via INTRAVENOUS
  Filled 2020-10-10: qty 10

## 2020-10-10 NOTE — Progress Notes (Signed)
1       [facility] [facility]   PATIENT NAME: Natalie Adams    MR#:  [id number]  DATE OF BIRTH:  December 14, 1929  SUBJECTIVE:  CHIEF COMPLAINT:   Chief Complaint  Patient presents with  . Altered Mental Status  Remains very confused and keeps moaning REVIEW OF SYSTEMS:  ROS unable to obtain due to her mental status DRUG ALLERGIES:   Allergies  Allergen Reactions  . Amoxicillin-Pot Clavulanate Diarrhea  . Hydrochlorothiazide Other (See Comments)    "she got shaky and fell" "she got shaky and fell"    VITALS:  Blood pressure (!) 127/52, pulse 90, temperature 98.7 F (37.1 C), temperature source Oral, resp. rate 18, height 5\' 7"  (1.702 m), weight 90.7 kg, SpO2 98 %. PHYSICAL EXAMINATION:  Physical Exam 87 year old female lying in the bed, pleasantly confused Lungs clear to auscultation bilaterally, no wheezing rales rhonchi crepitation Cardiovascular S1-S2 normal, no murmur rales or gallop Abdomen soft, benign Neuro awake but pleasantly confused and disoriented.  Keeps repeating herself and not making any sense Skin no rash or lesion  LABORATORY PANEL:  Female CBC Recent Labs  Lab 10/10/20 0438  WBC 12.0*  HGB 9.6*  HCT 30.7*  PLT 243   ------------------------------------------------------------------------------------------------------------------ Chemistries  Recent Labs  Lab 10/09/20 0510 10/10/20 0438  NA 133* 136  K 5.5* 3.6  CL 100 103  CO2 22 25  GLUCOSE 188* 192*  BUN 27* 18  CREATININE 1.24* 0.74  CALCIUM 9.6 8.7*  AST 36  --   ALT 21  --   ALKPHOS 103  --   BILITOT 1.2  --    RADIOLOGY:  No results found. ASSESSMENT AND PLAN:  87 y.o. female with medical history significant for  Natalie Adams is a 87 y.o. female with medical history significant for HTN, DM, bilateral hearing loss, mild aortic stenosis and pulmonary hypertension admitted for evaluation of mental status changes  Sepsis secondary to UTI present on admission Patient  presents for evaluation of decreased level of responsiveness and was found to have acute urinary retention requiring insertion of Foley catheter with drainage of 596 mL of urine She is septic as evidenced by tachycardia, tachypnea, marked leukocytosis, lactic acidosis and pyuria Continue Rocephin at higher dose of 2 g IV daily Await urine culture Judicious IV fluid resuscitation lactic acid level improved from 3.1->1.9    Acute kidney injury with hyperkalemia present on admission and now resolved Most likely prerenal related to poor oral intake as well as from GI losses related to emesis Resolved with IV hydration and a one-time dose of Lokelma   Essential hypertension Continue metoprolol Hold amlodipine for now   Acute urinary retention Required Foley catheter insertion with drainage of 596 mL of urine Voiding trial prior to discharge.  She is very confused today so not a good day for voiding trial  Body mass index is 31.32 kg/m.  Net IO Since Admission: 713.82 mL [10/10/20 1248]      Status is: Inpatient  Remains inpatient appropriate because:IV treatments appropriate due to intensity of illness or inability to take PO  Dispo: The patient is from: Home              Anticipated d/c is to: Home              Patient currently is not medically stable to d/c.   Difficult to place patient No    DVT prophylaxis:       enoxaparin (LOVENOX) injection  40 mg Start: 10/09/20 0900 SCDs Start: 10/09/20 1572     Family Communication: Left voicemail for patient's daughter Natalie Adams at [phone] on 6/11   All the records are reviewed and case discussed with Care Management/Social Worker. Management plans discussed with the patient, family and they are in agreement.  CODE STATUS: DNR Level of care: Progressive Cardiac  TOTAL TIME TAKING CARE OF THIS PATIENT: 35 minutes.   More than 50% of the time was spent in counseling/coordination of care: YES  POSSIBLE D/C IN 2-3 DAYS,  DEPENDING ON CLINICAL CONDITION.   Delfino Lovett M.D on 10/10/2020 at 12:48 PM  [facility]   CC: Primary care physician; [facility]  Note: This dictation was prepared with Dragon dictation along with smaller phrase technology. Any transcriptional errors that result from this process are unintentional.

## 2020-10-10 NOTE — Progress Notes (Signed)
PHARMACY - PHYSICIAN COMMUNICATION CRITICAL VALUE ALERT - BLOOD CULTURE IDENTIFICATION (BCID)  Natalie Adams is an 87 y.o. female who presented to [facility] on 10/09/2020 with a chief complaint of altered mental status.  Assessment:  1/4 bottles GPC. BCID detected Staphylococcus species. This result may reflect contamination  Name of physician (or Provider) Contacted: Dr. Sherryll Burger  Current antibiotics: Ceftriaxone (urosepsis)  Changes to prescribed antibiotics recommended:   Continue ceftriaxone at this time. Increase dose to 2 g IV q24h  Results for orders placed or performed during the hospital encounter of 10/09/20  Blood Culture ID Panel (Reflexed) (Collected: 10/09/2020  5:08 AM)  Result Value Ref Range   Enterococcus faecalis NOT DETECTED NOT DETECTED   Enterococcus Faecium NOT DETECTED NOT DETECTED   Listeria monocytogenes NOT DETECTED NOT DETECTED   Staphylococcus species DETECTED (A) NOT DETECTED   Staphylococcus aureus (BCID) NOT DETECTED NOT DETECTED   Staphylococcus epidermidis NOT DETECTED NOT DETECTED   Staphylococcus lugdunensis NOT DETECTED NOT DETECTED   Streptococcus species NOT DETECTED NOT DETECTED   Streptococcus agalactiae NOT DETECTED NOT DETECTED   Streptococcus pneumoniae NOT DETECTED NOT DETECTED   Streptococcus pyogenes NOT DETECTED NOT DETECTED   A.calcoaceticus-baumannii NOT DETECTED NOT DETECTED   Bacteroides fragilis NOT DETECTED NOT DETECTED   Enterobacterales NOT DETECTED NOT DETECTED   Enterobacter cloacae complex NOT DETECTED NOT DETECTED   Escherichia coli NOT DETECTED NOT DETECTED   Klebsiella aerogenes NOT DETECTED NOT DETECTED   Klebsiella oxytoca NOT DETECTED NOT DETECTED   Klebsiella pneumoniae NOT DETECTED NOT DETECTED   Proteus species NOT DETECTED NOT DETECTED   Salmonella species NOT DETECTED NOT DETECTED   Serratia marcescens NOT DETECTED NOT DETECTED   Haemophilus influenzae NOT DETECTED NOT DETECTED   Neisseria meningitidis NOT  DETECTED NOT DETECTED   Pseudomonas aeruginosa NOT DETECTED NOT DETECTED   Stenotrophomonas maltophilia NOT DETECTED NOT DETECTED   Candida albicans NOT DETECTED NOT DETECTED   Candida auris NOT DETECTED NOT DETECTED   Candida glabrata NOT DETECTED NOT DETECTED   Candida krusei NOT DETECTED NOT DETECTED   Candida parapsilosis NOT DETECTED NOT DETECTED   Candida tropicalis NOT DETECTED NOT DETECTED   Cryptococcus neoformans/gattii NOT DETECTED NOT DETECTED    [name] 10/10/2020  9:38 AM

## 2020-10-11 LAB — BASIC METABOLIC PANEL
Anion gap: 5 (ref 5–15)
BUN: 12 mg/dL (ref 8–23)
CO2: 27 mmol/L (ref 22–32)
Calcium: 8.4 mg/dL — ABNORMAL LOW (ref 8.9–10.3)
Chloride: 104 mmol/L (ref 98–111)
Creatinine, Ser: 0.7 mg/dL (ref 0.44–1.00)
GFR, Estimated: >60 mL/min (ref >60)
Glucose, Bld: 175 mg/dL — ABNORMAL HIGH (ref 70–99)
Potassium: 3.9 mmol/L (ref 3.5–5.1)
Sodium: 136 mmol/L (ref 135–145)

## 2020-10-11 LAB — URINE CULTURE: Culture: >=100000 — AB

## 2020-10-11 LAB — CBC
HCT: 29.9 % — ABNORMAL LOW (ref 36.0–46.0)
Hemoglobin: 9.2 g/dL — ABNORMAL LOW (ref 12.0–15.0)
MCH: 19.5 pg — ABNORMAL LOW (ref 26.0–34.0)
MCHC: 30.8 g/dL (ref 30.0–36.0)
MCV: 63.2 fL — ABNORMAL LOW (ref 80.0–100.0)
Platelets: 246 10*3/uL (ref 150–400)
RBC: 4.73 MIL/uL (ref 3.87–5.11)
RDW: 16.5 % — ABNORMAL HIGH (ref 11.5–15.5)
WBC: 11.6 10*3/uL — ABNORMAL HIGH (ref 4.0–10.5)
nRBC: 0 % (ref 0.0–0.2)

## 2020-10-11 LAB — GLUCOSE, CAPILLARY
Glucose-Capillary: 163 mg/dL — ABNORMAL HIGH (ref 70–99)
Glucose-Capillary: 159 mg/dL — ABNORMAL HIGH (ref 70–99)
Glucose-Capillary: 194 mg/dL — ABNORMAL HIGH (ref 70–99)
Glucose-Capillary: 189 mg/dL — ABNORMAL HIGH (ref 70–99)

## 2020-10-11 MED ORDER — SODIUM CHLORIDE 0.9 % IV SOLN
2.0000 g | Freq: Three times a day (TID) | INTRAVENOUS | Status: AC
  Administered 2020-10-11 – 2020-10-12 (×3): 2 g via INTRAVENOUS
  Filled 2020-10-11 (×3): qty 2

## 2020-10-11 MED ORDER — CIPROFLOXACIN HCL 500 MG PO TABS: 500.0000 mg | ORAL_TABLET | Freq: Two times a day (BID) | ORAL | 0 refills | Status: AC

## 2020-10-11 MED ORDER — BISACODYL 10 MG RE SUPP
10.0000 mg | Freq: Every day | RECTAL | Status: DC | PRN
  Administered 2020-10-11: 10 mg via RECTAL
  Filled 2020-10-11: qty 1

## 2020-10-11 MED ORDER — CIPROFLOXACIN HCL 500 MG PO TABS
500.0000 mg | ORAL_TABLET | Freq: Two times a day (BID) | ORAL | Status: DC
  Administered 2020-10-12: 500 mg via ORAL
  Filled 2020-10-11: qty 1

## 2020-10-11 MED ORDER — AMLODIPINE BESYLATE 5 MG PO TABS
5.0000 mg | ORAL_TABLET | Freq: Every day | ORAL | Status: DC
  Administered 2020-10-11 – 2020-10-12 (×2): 5 mg via ORAL
  Filled 2020-10-11 (×2): qty 1

## 2020-10-11 NOTE — Progress Notes (Signed)
1       [place] at [facility]   PATIENT NAME: Natalie Adams    MR#:  [id number]  DATE OF BIRTH:  May 30, 1929  SUBJECTIVE:  CHIEF COMPLAINT:   Chief Complaint  Patient presents with  . Altered Mental Status  Looking much better.  Pleasantly confused REVIEW OF SYSTEMS:  ROS unable to obtain due to her mental status DRUG ALLERGIES:   Allergies  Allergen Reactions  . Amoxicillin-Pot Clavulanate Diarrhea  . Hydrochlorothiazide Other (See Comments)    "she got shaky and fell" "she got shaky and fell"    VITALS:  Blood pressure (!) 156/77, pulse 79, temperature 97.6 F (36.4 C), temperature source Oral, resp. rate 18, height 5\' 7"  (1.702 m), weight 90.7 kg, SpO2 98 %. PHYSICAL EXAMINATION:  Physical Exam 87 year old female lying in the bed, pleasantly confused Lungs clear to auscultation bilaterally, no wheezing rales rhonchi crepitation Cardiovascular S1-S2 normal, no murmur rales or gallop Abdomen soft, benign Neuro awake but pleasantly confused and disoriented. Skin no rash or lesion LABORATORY PANEL:  Female CBC Recent Labs  Lab 10/11/20 0459  WBC 11.6*  HGB 9.2*  HCT 29.9*  PLT 246    ------------------------------------------------------------------------------------------------------------------ Chemistries  Recent Labs  Lab 10/09/20 0510 10/10/20 0438 10/11/20 0459  NA 133*   < > 136  K 5.5*   < > 3.9  CL 100   < > 104  CO2 22   < > 27  GLUCOSE 188*   < > 175*  BUN 27*   < > 12  CREATININE 1.24*   < > 0.70  CALCIUM 9.6   < > 8.4*  AST 36  --   --   ALT 21  --   --   ALKPHOS 103  --   --   BILITOT 1.2  --   --    < > = values in this interval not displayed.    RADIOLOGY:  No results found. ASSESSMENT AND PLAN:  87 y.o. female with medical history significant for  Natalie Adams is a 87 y.o. female with medical history significant for HTN, DM, bilateral hearing loss, mild aortic stenosis and pulmonary hypertension admitted for  evaluation of mental status changes  Sepsis secondary to UTI present on admission Pseudomonas UTI Patient presents for evaluation of decreased level of responsiveness and was found to have acute urinary retention requiring insertion of Foley catheter with drainage of 596 mL of urine.  We will try to remove Foley and give voiding trial She is septic as evidenced by tachycardia, tachypnea, marked leukocytosis, lactic acidosis and pyuria Switch to IV Fortaz and plan for discharge on oral Cipro for total 5 days urine culture growing Pseudomonas lactic acid level improved from 3.1->1.9    Acute kidney injury with hyperkalemia present on admission and now resolved Most likely prerenal related to poor oral intake as well as from GI losses related to emesis Resolved with IV hydration and a one-time dose of Lokelma   Essential hypertension Continue metoprolol Resume amlodipine for better blood pressure control   Acute urinary retention Required Foley catheter insertion with drainage of 596 mL of urine Voiding trial today.  And get Foley catheter out if possible  Body mass index is 31.32 kg/m.  Net IO Since Admission: 2,312.21 mL [10/11/20 1232]      Status is: Inpatient  Remains inpatient appropriate because:IV treatments appropriate due to intensity of illness or inability to take PO  Dispo: The patient is from: Home  Anticipated d/c is to: Home              Patient currently is not medically stable to d/c.   Difficult to place patient No    DVT prophylaxis:       enoxaparin (LOVENOX) injection 40 mg Start: 10/09/20 0900 SCDs Start: 10/09/20 4098     Family Communication: Updated patient's daughter Natalie Adams at [phone] on 6/12   All the records are reviewed and case discussed with Care Management/Social Worker. Management plans discussed with the patient, family and they are in agreement.  CODE STATUS: DNR Level of care: Progressive Cardiac  TOTAL TIME  TAKING CARE OF THIS PATIENT: 35 minutes.   More than 50% of the time was spent in counseling/coordination of care: YES  POSSIBLE D/C IN 1 DAYS, DEPENDING ON CLINICAL CONDITION.   Delfino Lovett M.D on 10/11/2020 at 12:32 PM  [facility]   CC: Primary care physician; [facility]  Note: This dictation was prepared with Dragon dictation along with smaller phrase technology. Any transcriptional errors that result from this process are unintentional.

## 2020-10-12 DIAGNOSIS — E860 Dehydration: Secondary | ICD-10-CM

## 2020-10-12 DIAGNOSIS — R4182 Altered mental status, unspecified: Secondary | ICD-10-CM

## 2020-10-12 LAB — CULTURE, BLOOD (ROUTINE X 2): Special Requests: ADEQUATE

## 2020-10-12 LAB — GLUCOSE, CAPILLARY
Glucose-Capillary: 208 mg/dL — ABNORMAL HIGH (ref 70–99)
Glucose-Capillary: 250 mg/dL — ABNORMAL HIGH (ref 70–99)

## 2020-10-12 MED ORDER — METOPROLOL TARTRATE 50 MG PO TABS
50.0000 mg | ORAL_TABLET | ORAL | Status: DC
  Filled 2020-10-12: qty 1

## 2020-10-12 NOTE — Progress Notes (Signed)
Patient's daughter given instructions to make follow up appointments, IV discontinued, when to return for worsening symptoms, & discharged via stretcher.

## 2020-10-12 NOTE — Clinical Social Work Note (Signed)
Patient has orders to discharge home today. Confirmed with daughter that address on facesheet is correct. EMS transport has been arranged for 2:00 per daughter request. No further concerns. CSW signing off.  Charlynn Court, CSW [phone]

## 2020-10-14 LAB — CULTURE, BLOOD (ROUTINE X 2): Culture: NO GROWTH

## 2020-10-14 NOTE — Discharge Summary (Addendum)
[place] at [facility]   PATIENT NAME: Natalie Adams    MR#:  [id number]  DATE OF BIRTH:  01-24-1930  DATE OF ADMISSION:  10/09/2020   ADMITTING PHYSICIAN: Lucile Shutters, MD  DATE OF DISCHARGE: 10/12/2020  3:28 PM  PRIMARY CARE PHYSICIAN: [facility]   ADMISSION DIAGNOSIS:  Dehydration [E86.0] AKI (acute kidney injury) (HCC) [N17.9] Sepsis secondary to UTI (HCC) [A41.9, N39.0] Sepsis (HCC) [A41.9] Urinary tract infection without hematuria, site unspecified [N39.0] Altered mental status, unspecified altered mental status type [R41.82] Sepsis, due to unspecified organism, unspecified whether acute organ dysfunction present (HCC) [A41.9] DISCHARGE DIAGNOSIS:  Principal Problem:   Sepsis secondary to UTI (HCC) Active Problems:   Hypertension   Diabetes mellitus without complication (HCC)   Pulmonary hypertension (HCC)   Bilateral hearing loss   Aortic stenosis   AKI (acute kidney injury) (HCC)   Hyperkalemia   Acute urinary retention   Altered mental status   Dehydration  SECONDARY DIAGNOSIS:   Past Medical History:  Diagnosis Date   Arthritis    Asthma    Diabetes mellitus without complication (HCC)    Hypertension    Morbid obesity (HCC)    HOSPITAL COURSE:  87 y.o. female with medical history significant for  Caya Soberanis is a 87 y.o. female with medical history significant for HTN, DM, bilateral hearing loss, mild aortic stenosis and pulmonary hypertension admitted for evaluation of mental status changes thought to be due to infection.   Sepsis secondary to UTI present on admission Pseudomonas UTI Patient presents for evaluation of decreased level of responsiveness and was found to have acute urinary retention requiring insertion of Foley catheter with drainage of 596 mL of urine.  Foley was removed in 24 hrs and she has been voiding fine. She was septic as evidenced by tachycardia, tachypnea, marked leukocytosis, lactic acidosis  and pyuria Treated with IV Abx while in the hospital and discharged on oral Cipro for total 5 days urine culture growing Pseudomonas lactic acid level improved from 3.1->1.9    Acute kidney injury with hyperkalemia present on admission and now resolved Most likely prerenal related to poor oral intake as well as from GI losses related to emesis Resolved with IV hydration and a one-time dose of Lokelma   Essential hypertension Controlled   Acute urinary retention Required Foley catheter insertion with drainage of 596 mL of urine Foley was removed within 24 hrs without any issues,  DISCHARGE CONDITIONS:  Fair CONSULTS OBTAINED:   DRUG ALLERGIES:   Allergies  Allergen Reactions   Amoxicillin-Pot Clavulanate Diarrhea   Hydrochlorothiazide Other (See Comments)    "she got shaky and fell" "she got shaky and fell"    DISCHARGE MEDICATIONS:   Allergies as of 10/12/2020       Reactions   Amoxicillin-pot Clavulanate Diarrhea   Hydrochlorothiazide Other (See Comments)   "she got shaky and fell" "she got shaky and fell"        Medication List     STOP taking these medications    Cholecalciferol 125 MCG (5000 UT) Tabs       TAKE these medications    acetaminophen 500 MG tablet Commonly known as: TYLENOL Take 2 tablets (1,000 mg total) by mouth 2 (two) times daily as needed for mild pain or moderate pain.   Algesis Tabs Take by mouth.   amLODipine 10 MG tablet Commonly known as: NORVASC Take 5 mg by mouth daily.   ascorbic acid 1000 MG tablet Commonly known as:  VITAMIN C Take by mouth.   ciprofloxacin 500 MG tablet Commonly known as: CIPRO Take 1 tablet (500 mg total) by mouth 2 (two) times daily for 4 days.   Fish Oil 1200 MG Caps Take by mouth.   insulin lispro 100 UNIT/ML injection Commonly known as: HUMALOG Sliding scale insulin # of units = (BS-150/50) TID AC   insulin NPH Human 100 UNIT/ML injection Commonly known as: NOVOLIN N Inject 14 Units  into the skin daily before breakfast.   ipratropium-albuterol 0.5-2.5 (3) MG/3ML Soln Commonly known as: DUONEB SMARTSIG:1 Ampule(s) Via Nebulizer Every 6 Hours PRN   Iron 18 MG Tbcr Take 1 tablet (18 mg total) by mouth daily. Can take any over-the-counter iron supplement.   metFORMIN 1000 MG tablet Commonly known as: GLUCOPHAGE Take 1,000 mg by mouth in the morning and at bedtime.   metoprolol tartrate 25 MG tablet Commonly known as: LOPRESSOR Take 12.5 mg by mouth 2 (two) times daily.   Multiple Vitamins tablet Take by mouth.   pravastatin 40 MG tablet Commonly known as: PRAVACHOL Take 40 mg by mouth at bedtime.       DISCHARGE INSTRUCTIONS:   DIET:  Regular diet DISCHARGE CONDITION:  Fair ACTIVITY:  Activity as tolerated OXYGEN:  Home Oxygen: No.  Oxygen Delivery: room air DISCHARGE LOCATION:  home   If you experience worsening of your admission symptoms, develop shortness of breath, life threatening emergency, suicidal or homicidal thoughts you must seek medical attention immediately by calling 911 or calling your MD immediately  if symptoms less severe.  You Must read complete instructions/literature along with all the possible adverse reactions/side effects for all the Medicines you take and that have been prescribed to you. Take any new Medicines after you have completely understood and accpet all the possible adverse reactions/side effects.   Please note  You were cared for by a hospitalist during your hospital stay. If you have any questions about your discharge medications or the care you received while you were in the hospital after you are discharged, you can call the unit and asked to speak with the hospitalist on call if the hospitalist that took care of you is not available. Once you are discharged, your primary care physician will handle any further medical issues. Please note that NO REFILLS for any discharge medications will be authorized once you are  discharged, as it is imperative that you return to your primary care physician (or establish a relationship with a primary care physician if you do not have one) for your aftercare needs so that they can reassess your need for medications and monitor your lab values.    On the day of Discharge:  VITAL SIGNS:  Blood pressure (!) 143/73, pulse 89, temperature 98 F (36.7 C), temperature source Oral, resp. rate 18, height 5\' 7"  (1.702 m), weight 90.7 kg, SpO2 99 %. PHYSICAL EXAMINATION:  GENERAL:  87 y.o.-year-old patient lying in the bed with no acute distress. pleasantly confused Lungs clear to auscultation bilaterally, no wheezing rales rhonchi crepitation Cardiovascular S1-S2 normal, no murmur rales or gallop Abdomen soft, benign Neuro awake but pleasantly confused and disoriented. Skin no rash or lesion DATA REVIEW:   CBC Recent Labs  Lab 10/11/20 0459  WBC 11.6*  HGB 9.2*  HCT 29.9*  PLT 246    Chemistries  Recent Labs  Lab 10/09/20 0510 10/10/20 0438 10/11/20 0459  NA 133*   < > 136  K 5.5*   < > 3.9  CL 100   < >  104  CO2 22   < > 27  GLUCOSE 188*   < > 175*  BUN 27*   < > 12  CREATININE 1.24*   < > 0.70  CALCIUM 9.6   < > 8.4*  AST 36  --   --   ALT 21  --   --   ALKPHOS 103  --   --   BILITOT 1.2  --   --    < > = values in this interval not displayed.     Outpatient follow-up  Follow-up Information     [facility] Schedule an appointment as soon as possible for a visit in 2 day(s).   Specialty: Geriatric Medicine Why: [facility] Discharge F/UP Contact information: [address] [place] [address] [phone]                 30 Day Unplanned Readmission Risk Score    Flowsheet Row ED to Hosp-Admission (Discharged) from 10/09/2020 in [facility]  30 Day Unplanned Readmission Risk Score (%) 18.72 Filed at 10/12/2020 1200       This score is the patient's risk of  an unplanned readmission within 30 days of being discharged (0 -100%). The score is based on dignosis, age, lab data, medications, orders, and past utilization.   Low:  0-14.9   Medium: 15-21.9   High: 22-29.9   Extreme: 30 and above           Management plans discussed with the patient, family and they are in agreement.  CODE STATUS: Prior   TOTAL TIME TAKING CARE OF THIS PATIENT: 45 minutes.    Delfino Lovett M.D on 10/14/2020 at 8:52 AM  [facility]   CC: Primary care physician; [facility]   Note: This dictation was prepared with Dragon dictation along with smaller phrase technology. Any transcriptional errors that result from this process are unintentional.

## 2020-10-20 ENCOUNTER — Telehealth: Payer: Self-pay | Admitting: Nurse Practitioner

## 2020-10-20 NOTE — Telephone Encounter (Signed)
Called patient's daughter, Orel Hord, to offer to schedule a Palliative Consult, no answer - left message with reason for call along with my name and call back number.

## 2020-10-22 ENCOUNTER — Telehealth: Payer: Self-pay | Admitting: Nurse Practitioner

## 2020-10-22 NOTE — Telephone Encounter (Signed)
Returned call to patient's daughter, Alden Server, and discussed the Palliative referral with her and she had declined Palliative services at this time.  Daughter stated that patient has Doctor's [employer] coming out to see patient and she doesn't feel that she needs any extra support at this time.  I explained to the daughter that I would cancel the referral and she was in agreement with this.  Will notify her PCP.

## 2021-12-11 ENCOUNTER — Inpatient Hospital Stay
Admission: EM | Admit: 2021-12-11 | Discharge: 2021-12-15 | DRG: 388 | Disposition: A | Discharge status: Home Health | Payer: Medicaid Other | Attending: Internal Medicine | Admitting: Internal Medicine

## 2021-12-11 ENCOUNTER — Encounter: Payer: Self-pay | Admitting: Radiology

## 2021-12-11 ENCOUNTER — Emergency Department: Payer: Medicaid Other

## 2021-12-11 DIAGNOSIS — Z7984 Long term (current) use of oral hypoglycemic drugs: Secondary | ICD-10-CM | POA: Diagnosis not present

## 2021-12-11 DIAGNOSIS — D649 Anemia, unspecified: Secondary | ICD-10-CM | POA: Diagnosis present

## 2021-12-11 DIAGNOSIS — K5641 Fecal impaction: Principal | ICD-10-CM | POA: Diagnosis present

## 2021-12-11 DIAGNOSIS — E119 Type 2 diabetes mellitus without complications: Secondary | ICD-10-CM | POA: Diagnosis present

## 2021-12-11 DIAGNOSIS — E785 Hyperlipidemia, unspecified: Secondary | ICD-10-CM | POA: Diagnosis present

## 2021-12-11 DIAGNOSIS — Z8249 Family history of ischemic heart disease and other diseases of the circulatory system: Secondary | ICD-10-CM | POA: Diagnosis not present

## 2021-12-11 DIAGNOSIS — Z888 Allergy status to other drugs, medicaments and biological substances status: Secondary | ICD-10-CM

## 2021-12-11 DIAGNOSIS — Z79899 Other long term (current) drug therapy: Secondary | ICD-10-CM

## 2021-12-11 DIAGNOSIS — M1990 Unspecified osteoarthritis, unspecified site: Secondary | ICD-10-CM | POA: Diagnosis present

## 2021-12-11 DIAGNOSIS — E876 Hypokalemia: Secondary | ICD-10-CM | POA: Diagnosis not present

## 2021-12-11 DIAGNOSIS — I272 Pulmonary hypertension, unspecified: Secondary | ICD-10-CM | POA: Diagnosis present

## 2021-12-11 DIAGNOSIS — I10 Essential (primary) hypertension: Secondary | ICD-10-CM | POA: Diagnosis present

## 2021-12-11 DIAGNOSIS — R1310 Dysphagia, unspecified: Secondary | ICD-10-CM | POA: Diagnosis present

## 2021-12-11 DIAGNOSIS — Z66 Do not resuscitate: Secondary | ICD-10-CM | POA: Diagnosis present

## 2021-12-11 DIAGNOSIS — N1330 Unspecified hydronephrosis: Secondary | ICD-10-CM | POA: Diagnosis present

## 2021-12-11 DIAGNOSIS — J45909 Unspecified asthma, uncomplicated: Secondary | ICD-10-CM | POA: Diagnosis present

## 2021-12-11 DIAGNOSIS — G9341 Metabolic encephalopathy: Secondary | ICD-10-CM | POA: Diagnosis present

## 2021-12-11 DIAGNOSIS — Z794 Long term (current) use of insulin: Secondary | ICD-10-CM

## 2021-12-11 DIAGNOSIS — F0390 Unspecified dementia without behavioral disturbance: Secondary | ICD-10-CM | POA: Diagnosis present

## 2021-12-11 DIAGNOSIS — R338 Other retention of urine: Secondary | ICD-10-CM | POA: Diagnosis present

## 2021-12-11 DIAGNOSIS — Z880 Allergy status to penicillin: Secondary | ICD-10-CM

## 2021-12-11 DIAGNOSIS — L8990 Pressure ulcer of unspecified site, unspecified stage: Secondary | ICD-10-CM | POA: Diagnosis present

## 2021-12-11 DIAGNOSIS — K5289 Other specified noninfective gastroenteritis and colitis: Secondary | ICD-10-CM | POA: Diagnosis present

## 2021-12-11 DIAGNOSIS — R1312 Dysphagia, oropharyngeal phase: Secondary | ICD-10-CM | POA: Diagnosis not present

## 2021-12-11 DIAGNOSIS — G9340 Encephalopathy, unspecified: Principal | ICD-10-CM

## 2021-12-11 DIAGNOSIS — L89322 Pressure ulcer of left buttock, stage 2: Secondary | ICD-10-CM | POA: Diagnosis present

## 2021-12-11 DIAGNOSIS — R197 Diarrhea, unspecified: Secondary | ICD-10-CM | POA: Diagnosis present

## 2021-12-11 LAB — URINALYSIS, ROUTINE W REFLEX MICROSCOPIC
Bacteria, UA: NONE SEEN
Bilirubin Urine: NEGATIVE
Glucose, UA: NEGATIVE mg/dL
Ketones, ur: NEGATIVE mg/dL
Leukocytes,Ua: NEGATIVE
Nitrite: NEGATIVE
Protein, ur: NEGATIVE mg/dL
Specific Gravity, Urine: 1.008 (ref 1.005–1.030)
pH: 6 (ref 5.0–8.0)

## 2021-12-11 LAB — COMPREHENSIVE METABOLIC PANEL
ALT: 20 U/L (ref 0–44)
AST: 26 U/L (ref 15–41)
Albumin: 3.2 g/dL — ABNORMAL LOW (ref 3.5–5.0)
Alkaline Phosphatase: 104 U/L (ref 38–126)
Anion gap: 8 (ref 5–15)
BUN: 20 mg/dL (ref 8–23)
CO2: 23 mmol/L (ref 22–32)
Calcium: 9.3 mg/dL (ref 8.9–10.3)
Chloride: 104 mmol/L (ref 98–111)
Creatinine, Ser: 0.94 mg/dL (ref 0.44–1.00)
GFR, Estimated: 58 mL/min — ABNORMAL LOW (ref >60)
Glucose, Bld: 125 mg/dL — ABNORMAL HIGH (ref 70–99)
Potassium: 4.2 mmol/L (ref 3.5–5.1)
Sodium: 135 mmol/L (ref 135–145)
Total Bilirubin: 0.8 mg/dL (ref 0.3–1.2)
Total Protein: 6.9 g/dL (ref 6.5–8.1)

## 2021-12-11 LAB — CBC WITH DIFFERENTIAL/PLATELET
Abs Immature Granulocytes: 0.05 10*3/uL (ref 0.00–0.07)
Basophils Absolute: 0 10*3/uL (ref 0.0–0.1)
Basophils Relative: 0 %
Eosinophils Absolute: 0 10*3/uL (ref 0.0–0.5)
Eosinophils Relative: 0 %
HCT: 38 % (ref 36.0–46.0)
Hemoglobin: 11.6 g/dL — ABNORMAL LOW (ref 12.0–15.0)
Immature Granulocytes: 1 %
Lymphocytes Relative: 23 %
Lymphs Abs: 2.5 10*3/uL (ref 0.7–4.0)
MCH: 19.8 pg — ABNORMAL LOW (ref 26.0–34.0)
MCHC: 30.5 g/dL (ref 30.0–36.0)
MCV: 64.8 fL — ABNORMAL LOW (ref 80.0–100.0)
Monocytes Absolute: 0.8 10*3/uL (ref 0.1–1.0)
Monocytes Relative: 8 %
Neutro Abs: 7.4 10*3/uL (ref 1.7–7.7)
Neutrophils Relative %: 68 %
Platelets: 239 10*3/uL (ref 150–400)
RBC: 5.86 MIL/uL — ABNORMAL HIGH (ref 3.87–5.11)
RDW: 15.7 % — ABNORMAL HIGH (ref 11.5–15.5)
WBC: 10.8 10*3/uL — ABNORMAL HIGH (ref 4.0–10.5)
nRBC: 0.2 % (ref 0.0–0.2)

## 2021-12-11 LAB — TROPONIN I (HIGH SENSITIVITY): Troponin I (High Sensitivity): 19 ng/L — ABNORMAL HIGH (ref <18)

## 2021-12-11 LAB — LACTIC ACID, PLASMA: Lactic Acid, Venous: 1.3 mmol/L (ref 0.5–1.9)

## 2021-12-11 MED ORDER — BISACODYL 10 MG RE SUPP
10.0000 mg | Freq: Once | RECTAL | Status: AC
  Administered 2021-12-12: 10 mg via RECTAL
  Filled 2021-12-11: qty 1

## 2021-12-11 MED ORDER — IOHEXOL 300 MG/ML  SOLN
100.0000 mL | Freq: Once | INTRAMUSCULAR | Status: AC | PRN
  Administered 2021-12-11: 100 mL via INTRAVENOUS

## 2021-12-11 MED ORDER — PIPERACILLIN-TAZOBACTAM 3.375 G IVPB 30 MIN
3.3750 g | Freq: Once | INTRAVENOUS | Status: AC
  Administered 2021-12-12: 3.375 g via INTRAVENOUS
  Filled 2021-12-11: qty 50

## 2021-12-11 MED ORDER — SODIUM CHLORIDE 0.9 % IV BOLUS: 1000.0000 mL | Freq: Once | INTRAVENOUS | Status: DC

## 2021-12-11 MED ORDER — SODIUM CHLORIDE 0.9 % IV BOLUS
500.0000 mL | Freq: Once | INTRAVENOUS | Status: AC
  Administered 2021-12-11: 500 mL via INTRAVENOUS

## 2021-12-11 MED ORDER — LACTULOSE 10 GM/15ML PO SOLN: 10.0000 g | Freq: Once | ORAL | Status: DC

## 2021-12-11 NOTE — ED Provider Notes (Signed)
[facility] Provider Note    Event Date/Time   First MD Initiated Contact with Patient 12/11/21 2122     (approximate)   History   Altered Mental Status   HPI  Natalie Adams is a 88 y.o. female here with altered mental status.  History is primarily provided by patient's daughter.  Per report, the patient has been increasingly confused and had decreased urine output as well as appetite throughout the day today.  Patient also has been having increasing bowel movements, that have been looser than usual.  Patient unable provide much history today.  No recent fevers or chills.  No known recent sick contacts.  Patient has had very minimal urine output throughout the day today.  She complained of pain "all over."     Physical Exam   Triage Vital Signs: ED Triage Vitals  Enc Vitals Group     BP 12/11/21 2100 (!) 146/76     Pulse Rate 12/11/21 2100 81     Resp 12/11/21 2100 (!) 23     Temp 12/11/21 2100 98 F (36.7 C)     Temp Source 12/11/21 2100 Axillary     SpO2 12/11/21 2056 99 %     Weight 12/11/21 2103 158 lb 15.2 oz (72.1 kg)     Height --      Head Circumference --      Peak Flow --      Pain Score --      Pain Loc --      Pain Edu? --      Excl. in GC? --     Most recent vital signs: Vitals:   12/11/21 2230 12/11/21 2305  BP: (!) 107/59 (!) 112/58  Pulse: 80 76  Resp: 20 (!) 28  Temp:    SpO2: 96% 95%     General: Awake, no distress.  CV:  Good peripheral perfusion.  Regular rate and rhythm. Resp:  Normal effort.  Lungs clear to auscultation bilaterally. Abd:  No distention.  No tenderness.  Brown stool in the rectal vault, no obvious impaction though patient moderately tender. Other:  Disoriented, falls asleep easily.  Noted to move all extremities however.   ED Results / Procedures / Treatments   Labs (all labs ordered are listed, but only abnormal results are displayed) Labs Reviewed  CBC WITH DIFFERENTIAL/PLATELET -  Abnormal; Notable for the following components:      Result Value   WBC 10.8 (*)    RBC 5.86 (*)    Hemoglobin 11.6 (*)    MCV 64.8 (*)    MCH 19.8 (*)    RDW 15.7 (*)    All other components within normal limits  COMPREHENSIVE METABOLIC PANEL - Abnormal; Notable for the following components:   Glucose, Bld 125 (*)    Albumin 3.2 (*)    GFR, Estimated 58 (*)    All other components within normal limits  URINALYSIS, ROUTINE W REFLEX MICROSCOPIC - Abnormal; Notable for the following components:   Color, Urine YELLOW (*)    APPearance CLEAR (*)    Hgb urine dipstick MODERATE (*)    All other components within normal limits  TROPONIN I (HIGH SENSITIVITY) - Abnormal; Notable for the following components:   Troponin I (High Sensitivity) 19 (*)    All other components within normal limits  LACTIC ACID, PLASMA  LACTIC ACID, PLASMA     EKG Normal sinus rhythm, ventricular rate 83.  PR 177, QRS 80, QTc 468.  No acute ST elevations depressions.  No EKG evidence of acute ischemia or infarct.   RADIOLOGY CT abdomen/pelvis: Question of impacted stool in the rectum with significant colonic stool burden, possible stercoral colitis, mild hydronephrosis and perinephric stranding Chest x-ray: Possible interstitial edema versus atypical infection CT head: No acute abnormality   I also independently reviewed and agree with radiologist interpretations.   PROCEDURES:  Critical Care performed: No  ***   MEDICATIONS ORDERED IN ED: Medications  sodium chloride 0.9 % bolus 500 mL (has no administration in time range)  lactulose (CHRONULAC) 10 GM/15ML solution 10 g (has no administration in time range)  bisacodyl (DULCOLAX) suppository 10 mg (has no administration in time range)  iohexol (OMNIPAQUE) 300 MG/ML solution 100 mL (100 mLs Intravenous Contrast Given 12/11/21 2234)     IMPRESSION / MDM / ASSESSMENT AND PLAN / ED COURSE  I reviewed the triage vital signs and the nursing notes.                                ***The patient is on the cardiac monitor to evaluate for evidence of arrhythmia and/or significant heart rate changes.   Ddx:  Differential includes the following, with pertinent life- or limb-threatening emergencies considered:  ***  Patient's presentation is most consistent with {EM COPA:27473}  MDM:  ***   MEDICATIONS GIVEN IN ED: Medications  sodium chloride 0.9 % bolus 500 mL (has no administration in time range)  lactulose (CHRONULAC) 10 GM/15ML solution 10 g (has no administration in time range)  bisacodyl (DULCOLAX) suppository 10 mg (has no administration in time range)  iohexol (OMNIPAQUE) 300 MG/ML solution 100 mL (100 mLs Intravenous Contrast Given 12/11/21 2234)     Consults:  ***   EMR reviewed  ***     FINAL CLINICAL IMPRESSION(S) / ED DIAGNOSES   Final diagnoses:  None     Rx / DC Orders   ED Discharge Orders     None        Note:  This document was prepared using Dragon voice recognition software and may include unintentional dictation errors.

## 2021-12-11 NOTE — ED Notes (Signed)
Pt's daughter was concerned that she did not provide info about Colace 1-2 every 4 days and Doculax suppository as needed.

## 2021-12-11 NOTE — Progress Notes (Signed)
PHARMACY -  BRIEF ANTIBIOTIC NOTE   Pharmacy has received consult(s) for Zosyn from an ED provider.  The patient's profile has been reviewed for ht/wt/allergies/indication/available labs.    One time order(s) placed for Zosyn 3.375 gm   Further antibiotics/pharmacy consults should be ordered by admitting physician if indicated.                       Thank you, Otelia Sergeant, PharmD, [facility] 12/11/2021 11:26 PM

## 2021-12-11 NOTE — ED Triage Notes (Signed)
Pt's daughter states pt has AMS. Pt usually can talk at baseline, but pt is not doing that now. Pt has a very limited amount of fluid intake and half piece of watermelon today.

## 2021-12-12 DIAGNOSIS — K529 Noninfective gastroenteritis and colitis, unspecified: Secondary | ICD-10-CM | POA: Insufficient documentation

## 2021-12-12 DIAGNOSIS — R1310 Dysphagia, unspecified: Secondary | ICD-10-CM

## 2021-12-12 DIAGNOSIS — K5289 Other specified noninfective gastroenteritis and colitis: Secondary | ICD-10-CM | POA: Diagnosis present

## 2021-12-12 DIAGNOSIS — E119 Type 2 diabetes mellitus without complications: Secondary | ICD-10-CM

## 2021-12-12 DIAGNOSIS — I10 Essential (primary) hypertension: Secondary | ICD-10-CM | POA: Diagnosis present

## 2021-12-12 DIAGNOSIS — G9341 Metabolic encephalopathy: Secondary | ICD-10-CM | POA: Diagnosis not present

## 2021-12-12 DIAGNOSIS — E785 Hyperlipidemia, unspecified: Secondary | ICD-10-CM | POA: Diagnosis present

## 2021-12-12 LAB — BASIC METABOLIC PANEL
Anion gap: 6 (ref 5–15)
BUN: 19 mg/dL (ref 8–23)
CO2: 22 mmol/L (ref 22–32)
Calcium: 8 mg/dL — ABNORMAL LOW (ref 8.9–10.3)
Chloride: 110 mmol/L (ref 98–111)
Creatinine, Ser: 0.85 mg/dL (ref 0.44–1.00)
GFR, Estimated: >60 mL/min (ref >60)
Glucose, Bld: 179 mg/dL — ABNORMAL HIGH (ref 70–99)
Potassium: 3.7 mmol/L (ref 3.5–5.1)
Sodium: 138 mmol/L (ref 135–145)

## 2021-12-12 LAB — LACTIC ACID, PLASMA: Lactic Acid, Venous: 1.6 mmol/L (ref 0.5–1.9)

## 2021-12-12 LAB — CBC
HCT: 36.7 % (ref 36.0–46.0)
Hemoglobin: 11.2 g/dL — ABNORMAL LOW (ref 12.0–15.0)
MCH: 19.9 pg — ABNORMAL LOW (ref 26.0–34.0)
MCHC: 30.5 g/dL (ref 30.0–36.0)
MCV: 65.3 fL — ABNORMAL LOW (ref 80.0–100.0)
Platelets: 236 10*3/uL (ref 150–400)
RBC: 5.62 MIL/uL — ABNORMAL HIGH (ref 3.87–5.11)
RDW: 15.8 % — ABNORMAL HIGH (ref 11.5–15.5)
WBC: 10.6 10*3/uL — ABNORMAL HIGH (ref 4.0–10.5)
nRBC: 0 % (ref 0.0–0.2)

## 2021-12-12 LAB — HEMOGLOBIN A1C
Hgb A1c MFr Bld: 7 % — ABNORMAL HIGH (ref 4.8–5.6)
Mean Plasma Glucose: 154.2 mg/dL

## 2021-12-12 LAB — GLUCOSE, CAPILLARY
Glucose-Capillary: 126 mg/dL — ABNORMAL HIGH (ref 70–99)
Glucose-Capillary: 87 mg/dL (ref 70–99)

## 2021-12-12 LAB — CBG MONITORING, ED
Glucose-Capillary: 260 mg/dL — ABNORMAL HIGH (ref 70–99)
Glucose-Capillary: 264 mg/dL — ABNORMAL HIGH (ref 70–99)
Glucose-Capillary: 235 mg/dL — ABNORMAL HIGH (ref 70–99)

## 2021-12-12 LAB — BRAIN NATRIURETIC PEPTIDE: B Natriuretic Peptide: 325.1 pg/mL — ABNORMAL HIGH (ref 0.0–100.0)

## 2021-12-12 MED ORDER — ACETAMINOPHEN 650 MG RE SUPP: 650.0000 mg | Freq: Four times a day (QID) | RECTAL | Status: DC | PRN

## 2021-12-12 MED ORDER — LABETALOL HCL 5 MG/ML IV SOLN
10.0000 mg | Freq: Once | INTRAVENOUS | Status: AC
Start: 2021-12-12 — End: 2021-12-12
  Administered 2021-12-12: 10 mg via INTRAVENOUS
  Filled 2021-12-12: qty 4

## 2021-12-12 MED ORDER — SODIUM CHLORIDE 0.9 % IV SOLN
2.0000 g | INTRAVENOUS | Status: DC
  Administered 2021-12-12 – 2021-12-13 (×2): 2 g via INTRAVENOUS
  Filled 2021-12-12: qty 2
  Filled 2021-12-12: qty 20

## 2021-12-12 MED ORDER — INSULIN ASPART 100 UNIT/ML IJ SOLN
0.0000 [IU] | Freq: Three times a day (TID) | INTRAMUSCULAR | Status: DC
  Administered 2021-12-12: 5 [IU] via SUBCUTANEOUS
  Administered 2021-12-12: 3 [IU] via SUBCUTANEOUS
  Administered 2021-12-13: 2 [IU] via SUBCUTANEOUS
  Administered 2021-12-13 – 2021-12-14 (×2): 1 [IU] via SUBCUTANEOUS
  Filled 2021-12-12 (×6): qty 1

## 2021-12-12 MED ORDER — AMLODIPINE BESYLATE 5 MG PO TABS
5.0000 mg | ORAL_TABLET | Freq: Every day | ORAL | Status: DC
  Administered 2021-12-13 – 2021-12-15 (×3): 5 mg via ORAL
  Filled 2021-12-12 (×4): qty 1

## 2021-12-12 MED ORDER — IPRATROPIUM-ALBUTEROL 0.5-2.5 (3) MG/3ML IN SOLN: 3.0000 mL | Freq: Four times a day (QID) | RESPIRATORY_TRACT | Status: DC | PRN

## 2021-12-12 MED ORDER — MAGNESIUM HYDROXIDE 400 MG/5ML PO SUSP: 30.0000 mL | Freq: Every day | ORAL | Status: DC | PRN

## 2021-12-12 MED ORDER — ENOXAPARIN SODIUM 40 MG/0.4ML IJ SOSY
40.0000 mg | PREFILLED_SYRINGE | INTRAMUSCULAR | Status: DC
  Administered 2021-12-12 – 2021-12-15 (×4): 40 mg via SUBCUTANEOUS
  Filled 2021-12-12 (×4): qty 0.4

## 2021-12-12 MED ORDER — METOPROLOL TARTRATE 25 MG PO TABS
12.5000 mg | ORAL_TABLET | Freq: Two times a day (BID) | ORAL | Status: DC
  Administered 2021-12-12 – 2021-12-15 (×6): 12.5 mg via ORAL
  Filled 2021-12-12 (×7): qty 1

## 2021-12-12 MED ORDER — LOPERAMIDE HCL 2 MG PO CAPS
2.0000 mg | ORAL_CAPSULE | ORAL | Status: DC | PRN
Start: 2021-12-12 — End: 2021-12-15

## 2021-12-12 MED ORDER — FUROSEMIDE 10 MG/ML IJ SOLN
20.0000 mg | Freq: Once | INTRAMUSCULAR | Status: AC
  Administered 2021-12-12: 20 mg via INTRAVENOUS
  Filled 2021-12-12: qty 4

## 2021-12-12 MED ORDER — ACETAMINOPHEN 325 MG PO TABS
650.0000 mg | ORAL_TABLET | Freq: Four times a day (QID) | ORAL | Status: DC | PRN
  Administered 2021-12-13: 650 mg via ORAL
  Filled 2021-12-12 (×2): qty 2

## 2021-12-12 MED ORDER — ASCORBIC ACID 500 MG PO TABS
1000.0000 mg | ORAL_TABLET | Freq: Every day | ORAL | Status: DC
  Administered 2021-12-13 – 2021-12-15 (×3): 1000 mg via ORAL
  Filled 2021-12-12 (×4): qty 2

## 2021-12-12 MED ORDER — FLEET ENEMA 7-19 GM/118ML RE ENEM
1.0000 | ENEMA | Freq: Every day | RECTAL | Status: DC | PRN
Start: 2021-12-12 — End: 2021-12-15

## 2021-12-12 MED ORDER — LABETALOL HCL 5 MG/ML IV SOLN: 20.0000 mg | INTRAVENOUS | Status: DC | PRN

## 2021-12-12 MED ORDER — METRONIDAZOLE 500 MG/100ML IV SOLN
500.0000 mg | Freq: Once | INTRAVENOUS | Status: AC
  Administered 2021-12-12: 500 mg via INTRAVENOUS

## 2021-12-12 MED ORDER — BISACODYL 5 MG PO TBEC: 10.0000 mg | DELAYED_RELEASE_TABLET | Freq: Every day | ORAL | Status: DC | PRN

## 2021-12-12 MED ORDER — TRAZODONE HCL 50 MG PO TABS: 25.0000 mg | ORAL_TABLET | Freq: Every evening | ORAL | Status: DC | PRN

## 2021-12-12 MED ORDER — LACTATED RINGERS IV SOLN
INTRAVENOUS | Status: DC
Start: 2021-12-12 — End: 2021-12-13

## 2021-12-12 MED ORDER — FERROUS GLUCONATE 324 (38 FE) MG PO TABS
324.0000 mg | ORAL_TABLET | Freq: Every day | ORAL | Status: DC
  Administered 2021-12-13 – 2021-12-15 (×3): 324 mg via ORAL
  Filled 2021-12-12 (×4): qty 1

## 2021-12-12 MED ORDER — ONDANSETRON HCL 4 MG/2ML IJ SOLN: 4.0000 mg | Freq: Four times a day (QID) | INTRAMUSCULAR | Status: DC | PRN

## 2021-12-12 MED ORDER — SODIUM CHLORIDE 0.9 % IV SOLN: INTRAVENOUS | Status: DC

## 2021-12-12 MED ORDER — POLYETHYLENE GLYCOL 3350 17 G PO PACK
17.0000 g | PACK | Freq: Every day | ORAL | Status: DC
  Administered 2021-12-13 – 2021-12-15 (×3): 17 g via ORAL
  Filled 2021-12-12 (×4): qty 1

## 2021-12-12 MED ORDER — DOCUSATE SODIUM 100 MG PO CAPS: 100.0000 mg | ORAL_CAPSULE | Freq: Two times a day (BID) | ORAL | Status: DC | PRN

## 2021-12-12 MED ORDER — INSULIN GLARGINE-YFGN 100 UNIT/ML ~~LOC~~ SOLN
10.0000 [IU] | Freq: Every day | SUBCUTANEOUS | Status: DC
  Administered 2021-12-12 – 2021-12-15 (×4): 10 [IU] via SUBCUTANEOUS
  Filled 2021-12-12 (×4): qty 0.1

## 2021-12-12 MED ORDER — OMEGA-3-ACID ETHYL ESTERS 1 G PO CAPS
1.0000 g | ORAL_CAPSULE | Freq: Every day | ORAL | Status: DC
  Administered 2021-12-13 – 2021-12-15 (×2): 1 g via ORAL
  Filled 2021-12-12 (×4): qty 1

## 2021-12-12 MED ORDER — METRONIDAZOLE 500 MG/100ML IV SOLN
500.0000 mg | Freq: Three times a day (TID) | INTRAVENOUS | Status: DC
  Administered 2021-12-12 – 2021-12-13 (×3): 500 mg via INTRAVENOUS
  Filled 2021-12-12 (×5): qty 100

## 2021-12-12 MED ORDER — PRAVASTATIN SODIUM 20 MG PO TABS
40.0000 mg | ORAL_TABLET | Freq: Every day | ORAL | Status: DC
  Administered 2021-12-13 – 2021-12-14 (×2): 40 mg via ORAL
  Filled 2021-12-12 (×3): qty 2

## 2021-12-12 MED ORDER — INSULIN ASPART 100 UNIT/ML IJ SOLN: 0.0000 [IU] | Freq: Every day | INTRAMUSCULAR | Status: DC

## 2021-12-12 MED ORDER — ONDANSETRON HCL 4 MG PO TABS: 4.0000 mg | ORAL_TABLET | Freq: Four times a day (QID) | ORAL | Status: DC | PRN

## 2021-12-12 NOTE — Evaluation (Signed)
Clinical/Bedside Swallow Evaluation Patient Details  Name: Natalie Adams MRN: [id number] Date of Birth: Jun 14, 1929  Today's Date: 12/12/2021 Time: SLP Start Time (ACUTE ONLY): 0950 SLP Stop Time (ACUTE ONLY): 1000 SLP Time Calculation (min) (ACUTE ONLY): 10 min  Past Medical History:  Past Medical History:  Diagnosis Date   Arthritis    Asthma    Diabetes mellitus without complication (HCC)    Hypertension    Morbid obesity (HCC)    Past Surgical History:  Past Surgical History:  Procedure Laterality Date   CATARACT EXTRACTION, BILATERAL     ORIF FEMUR FRACTURE Left 06/05/2020   Procedure: OPEN REDUCTION INTERNAL FIXATION (ORIF) DISTAL FEMUR FRACTURE;  Surgeon: Christena Flake, MD;  Location: [facility] ORS;  Service: Orthopedics;  Laterality: Left;   HPI:  Per H&P "Natalie Adams is a 88 y.o. African-American female with medical history significant for asthma, osteoarthritis, type 2 diabetes mellitus and hypertension, who presented to the emergency room with acute onset of altered mental status with confusion.  The patient has not been having much appetite with associated decreased urine output.  She has been having loose frequent bowel movements without melena or bright red meat per rectum.  No fever or chills.  No cough or wheezing or dyspnea.  No chest pain or palpitations.  No paresthesias or focal muscle weakness.  She has been hurting all over.  No other bleeding diathesis.     ED Course: When she came to the ER, BP was 146/76 with respiratory rate of 23 and otherwise normal vital signs.  Labs revealed albumin of 3.2 with otherwise unremarkable CMP.  Lactic acid was 1.3 and later 1.6 and BNP 325.1 with high sensitive troponin I of 19.  CBC showed WBC of 10.8 and hemoglobin 11.6 hematocrit 38 with platelets of 239.  UA showed moderate hemoglobin and was otherwise unremarkable.  EKG as reviewed by me : EKG showed sinus rhythm with a rate of 83 with probable left atrial enlargement, poor R wave  progression and RSR-in V1 or V2.  Imaging: Abdominal pelvic CT scan revealed the following:  1. Question of impacted stool in the rectum with developing mild  stercoral colitis.  2. Mild hydronephrosis and perinephric stranding. This is similar to  prior and may be chronic though recommend correlation with  urinalysis to exclude infection.  3. Cholelithiasis without evidence of cholecystitis.  4. Aortic atherosclerosis and aortic valve calcification.  Noncontrast head CT scan revealed generalized atrophy and small vessel white matter disease, chronic right sphenoid sinusitis with no acute intracranial normalities.  Portable chest ray showed interstitial prominence suggesting mild interstitial edema though atypical infection is not excluded.  It showed cardiomegaly.     The patient was given IV Zosyn, 500 mL IV normal saline and bolus and 10 mg PR Dulcolax.  She will be admitted to a medical telemetry bed for further evaluation and management."    Assessment / Plan / Recommendation  Clinical Impression  Pt seen for clinical swallowing evaluation. Pt with waxing/waning LOA, limited vocalizations. Cleared with RN who noted pt with difficulty taking Miralax in juice and pills in applesauce.  Pt known to SLP services from previous admission. Most recent clinical swallowing evaluation completed 06/09/20 with recommendations for Dysphagia 3 Diet with Thin Liquids (see note for details).   Attempted oral motor examination; however, unable to complete due to pt's inability to follow commands.   Pt given trials of thin liquids (via teaspoon, cup, and straw). Pt with s/sx moderate-severe oral dysphagia  c/b oral holding across all trials which was occasionally very prolonged (up to 30s), anterior loss of liquids via cup sip, and inability to siphon liquids from straw. Oral deficits likely secondary to reduced/mistimed labial seal and reduced bolus awareness. Concern for suspected pharyngeal dysphagia given seemingly  delayed swallow initiation to palpation. No overt s/sx pharyngeal dysphagia noted across trials. Pt's swallowing deficits likely exacerbated by AMS/confusion. Further PO trial deferred due to severity of pt's oral dysphagia and risks of aspiration (e.g. mental status, clinical presentation, comorbidities).   At present, a safe oral diet cannot be recommended. Recommend NPO with consideration for short term alternate route of nutrition/hydration/medication if in alignment with GOC. Pt may benefit from Palliative Consult to further discuss GOC.   SLP to f/u per POC for clinical swallowing re-evaluation.   RN made aware of results, recommendations, and SLP POC. Pt unable to receive education at this time due to AMS. RN stated she contacted MD to relay results, recommendations, and SLP POC.  SLP Visit Diagnosis: Dysphagia, oropharyngeal phase (R13.12)    Aspiration Risk  Moderate aspiration risk;Severe aspiration risk;Risk for inadequate nutrition/hydration    Diet Recommendation NPO (consideration for short term alternate route of nutrition/hydration/medication if in alignment with GOC)   Medication Administration: Via alternative means    Other  Recommendations Recommended Consults:  (Palliative Consult) Oral Care Recommendations: Oral care QID;Staff/trained caregiver to provide oral care    Recommendations for follow up therapy are one component of a multi-disciplinary discharge planning process, led by the attending physician.  Recommendations may be updated based on patient status, additional functional criteria and insurance authorization.  Follow up Recommendations  (TBD)      Assistance Recommended at Discharge Frequent or constant Supervision/Assistance  Functional Status Assessment Patient has had a recent decline in their functional status and demonstrates the ability to make significant improvements in function in a reasonable and predictable amount of time.  Frequency and  Duration min 2x/week  2 weeks       Prognosis Prognosis for Safe Diet Advancement: Fair Barriers to Reach Goals: Cognitive deficits;Severity of deficits (AMS)      Swallow Study   General Date of Onset: 12/11/21 HPI: Per H&P "Ryenn Howeth is a 88 y.o. African-American female with medical history significant for asthma, osteoarthritis, type 2 diabetes mellitus and hypertension, who presented to the emergency room with acute onset of altered mental status with confusion.  The patient has not been having much appetite with associated decreased urine output.  She has been having loose frequent bowel movements without melena or bright red meat per rectum.  No fever or chills.  No cough or wheezing or dyspnea.  No chest pain or palpitations.  No paresthesias or focal muscle weakness.  She has been hurting all over.  No other bleeding diathesis.     ED Course: When she came to the ER, BP was 146/76 with respiratory rate of 23 and otherwise normal vital signs.  Labs revealed albumin of 3.2 with otherwise unremarkable CMP.  Lactic acid was 1.3 and later 1.6 and BNP 325.1 with high sensitive troponin I of 19.  CBC showed WBC of 10.8 and hemoglobin 11.6 hematocrit 38 with platelets of 239.  UA showed moderate hemoglobin and was otherwise unremarkable.  EKG as reviewed by me : EKG showed sinus rhythm with a rate of 83 with probable left atrial enlargement, poor R wave progression and RSR-in V1 or V2.  Imaging: Abdominal pelvic CT scan revealed the following:  1.  Question of impacted stool in the rectum with developing mild  stercoral colitis.  2. Mild hydronephrosis and perinephric stranding. This is similar to  prior and may be chronic though recommend correlation with  urinalysis to exclude infection.  3. Cholelithiasis without evidence of cholecystitis.  4. Aortic atherosclerosis and aortic valve calcification.  Noncontrast head CT scan revealed generalized atrophy and small vessel white matter disease, chronic  right sphenoid sinusitis with no acute intracranial normalities.  Portable chest ray showed interstitial prominence suggesting mild interstitial edema though atypical infection is not excluded.  It showed cardiomegaly.     The patient was given IV Zosyn, 500 mL IV normal saline and bolus and 10 mg PR Dulcolax.  She will be admitted to a medical telemetry bed for further evaluation and management." Type of Study: Bedside Swallow Evaluation Previous Swallow Assessment: 06/09/20 clinical swallowing evalution - recommended Dysphagia 2, Thin Diet Prior to this Study: Regular;Thin liquids Temperature Spikes Noted: No (WBC 10.6) Respiratory Status: Room air History of Recent Intubation: No Behavior/Cognition: Confused;Doesn't follow directions (waxing/waning LOA) Oral Cavity Assessment:  (would not open mouth) Self-Feeding Abilities: Total assist Patient Positioning: Upright in bed Baseline Vocal Quality:  (limited vocalizations; difficult to assess) Volitional Cough: Cognitively unable to elicit Volitional Swallow: Unable to elicit    Oral/Motor/Sensory Function Overall Oral Motor/Sensory Function:  (unable to asses)   Ice Chips Ice chips: Not tested   Thin Liquid Thin Liquid: Impaired Presentation: Spoon;Cup;Straw Oral Phase Impairments: Reduced labial seal;Reduced lingual movement/coordination;Poor awareness of bolus Oral Phase Functional Implications: Right anterior spillage;Oral holding;Prolonged oral transit (unable to siphon liquids from straw) Pharyngeal  Phase Impairments: Suspected delayed Swallow    Nectar Thick Nectar Thick Liquid: Not tested   Honey Thick Honey Thick Liquid: Not tested   Puree Puree: Not tested   Solid      Solid: Not tested     Clyde Canterbury, M.S., CCC-SLP Speech-Language Pathologist [place] [facility] [phone] (ASCOM)  Alessandra Bevels Jacquelyn Shadrick 12/12/2021,10:49 AM

## 2021-12-12 NOTE — Assessment & Plan Note (Addendum)
Likely secondary to severe constipation. Loose stools reported but stool impaction of rectum seen on CT scan. Liquid stool passing around impacted stool. --Aggressive bowel regimen including suppositories and enemas --Treated with IV fluids and empiric IV antibiotics initially. --Stable off antibiotics, monitor

## 2021-12-12 NOTE — Assessment & Plan Note (Addendum)
POA, resolved.  Suspected due to stool impaction and urinary retention.  Started on empiric antibiotics on admission.   No evidence of infection. Antibiotics have been stopped and pt continues to do well, mental status at baseline. Patient has baseline dementia but able to feed self and make needs known, per daughter --Manage underlying conditions as outlined including bowel regimen and monitoring for urinary retention --Neuro checks have been unremarkable --Delirium precautions

## 2021-12-12 NOTE — Hospital Course (Addendum)
HPI on admission: "88 y.o. African-American female with medical history significant for asthma, osteoarthritis, type 2 diabetes mellitus and hypertension, who presented to the emergency room with ...altered mental status with confusion.  The patient has not been having much appetite with associated decreased urine output.  She has been having loose frequent bowel movements without melena or bright red meat per rectum.... "    In the ED, patient was tachypneic with BP mildly elevated 146/76 with otherwise normal vitals.  Labs were notable for normal lactic acid, elevated BNP 325.1, high sensitive troponin 19 with non-acute EKG.  CBC with very mild anemia hemoglobin 11.6 and mild leukocytosis WBC 10.8.  CT of abdomen pelvis showed stool impacted rectum with mild stercoral colitis, mild hydronephrosis and perinephric stranding present on prior scans and felt to be chronic. Noncontrast head CT showed generalized atrophy and ischemic small vessel disease but no acute findings.  Chest x-ray showed mild cardiomegaly with mild interstitial prominence most likely edema with atypical infection not excluded.  Patient mid to the hospital and started on empiric antibiotics and for further evaluation of acute encephalopathy.   8/13: mental status at baseline, per daughter.   Monitor off antibiotics and fluids.

## 2021-12-12 NOTE — Assessment & Plan Note (Addendum)
Resolved.  POA, likely secondary to pressure with associated severe constipation. -- Monitor urine output -- And out cath if retaining >500 cc and not voiding

## 2021-12-12 NOTE — ED Notes (Signed)
Pt changed with daughter's assistance

## 2021-12-12 NOTE — ED Notes (Signed)
Pt glucose 260. Will give insulin when breakfast tray arrives.

## 2021-12-12 NOTE — ED Notes (Signed)
MD paged for speech eval. Pt able to tolerate fluids and passed swallow exam, this RN concerned for pts ability to tolerate solids. Breakfast withheld at this time, pt does not want it. Insulin will be given for glucose of 260. Juice available for pt if needed.

## 2021-12-12 NOTE — Assessment & Plan Note (Addendum)
Continue home antihypertensives 

## 2021-12-12 NOTE — Assessment & Plan Note (Addendum)
Continue statin therapy.

## 2021-12-12 NOTE — Assessment & Plan Note (Addendum)
Hbg A1c 7.0%, poorly controlled. --Sliding scale NovoLog -- Continue his basal coverage. - Hold metformin.

## 2021-12-12 NOTE — Progress Notes (Signed)
Progress Note   Patient: Natalie Adams IEP:[id number] DOB: February 04, 1930 DOA: 12/11/2021     1 DOS: the patient was seen and examined on 12/12/2021   Brief hospital course: HPI on admission: "88 y.o. African-American female with medical history significant for asthma, osteoarthritis, type 2 diabetes mellitus and hypertension, who presented to the emergency room with ...altered mental status with confusion.  The patient has not been having much appetite with associated decreased urine output.  She has been having loose frequent bowel movements without melena or bright red meat per rectum.... "    In the ED, patient was tachypneic with BP mildly elevated 146/76 with otherwise normal vitals.  Labs were notable for normal lactic acid, elevated BNP 325.1, high sensitive troponin 19 with non-acute EKG.  CBC with very mild anemia hemoglobin 11.6 and mild leukocytosis WBC 10.8.  CT of abdomen pelvis showed stool impacted rectum with mild stercoral colitis, mild hydronephrosis and perinephric stranding present on prior scans and felt to be chronic. Noncontrast head CT showed generalized atrophy and ischemic small vessel disease but no acute findings.  Chest x-ray showed mild cardiomegaly with mild interstitial prominence most likely edema with atypical infection not excluded.  Patient mid to the hospital and started on empiric antibiotics and for further evaluation of acute encephalopathy  Assessment and Plan: * Acute metabolic encephalopathy Suspected due to stool impaction and urinary retention, infection not yet ruled out.  Started on empiric antibiotics on admission. Baseline mental status unclear. --Manage underlying conditions as outlined- --Neuro checks --Delirium precautions   Stercoral colitis Likely secondary to severe constipation. Loose stools reported but stool impaction of rectum seen on CT scan. Liquid stool passing around impacted stool. --Aggressive bowel regimen including  suppositories and enemas --Hydration - IV fluids - Continue  on IV Rocephin and Flagyl for now  Acute urinary retention Likely secondary to pressure with associated severe constipation. -- Monitor urine output -- And out cath if retaining >500 cc and not voiding --Place Foley if requiring multiple in/out caths  Type 2 diabetes mellitus without complication, with long-term current use of insulin (HCC) Hbg A1c 7.0%, poorly controlled. --Sliding scale NovoLog -- Continue his basal coverage. - Hold metformin.  Dysphagia Likely due to patient's altered mental status and reduced level of alertness.  Unknown if swallow difficulty at baseline. --SLP following --NPO for now --Gentle maintenance IV fluids --Monitor respiratory status on fluids --Aspiration precautions  Acute gastroenteritis - She will be hydrated with IV normal saline. - Her  diarrhea could be overflow diarrhea with constipation.  Dyslipidemia --Continue statin therapy.  Essential hypertension --Continue home antihypertensives        Subjective: Pt seen in the ED this morning, holding for a bed.  She is pretty lethargic, but will open eyes to voice plus physical stimulation.  She is non-verbal for me.  RN reported increased lethargy over course of the morning.  Purewick canister over half full of pale yellow urine.  Patient noted to choke with attempt of PO intake, made NPO for now and SLP is following.     Physical Exam: Vitals:   12/12/21 1000 12/12/21 1209 12/12/21 1304 12/12/21 1518  BP: (!) 162/76 (!) 167/88 (!) 158/80 135/76  Pulse: 93 86 83 76  Resp: (!) 25 19 17 18   Temp:  (!) 96.6 F (35.9 C) 98.9 F (37.2 C)   TempSrc:  Axillary Oral   SpO2: 100% 100% 100% 100%  Weight:      Height:  General exam: lethargic, non-verbal, no acute distress HEENT: moist mucus membranes, opens eyes only briefly Respiratory system: CTAB with diminished bases, no wheezes, on room air, normal respiratory  effort. Cardiovascular system: normal S1/S2, RRR,o pedal edema.   Gastrointestinal system: soft, NT, ND, +bowel sounds Central nervous system: unable to evaluate due to lethargy and patient not following commands Extremities: no edema, normal tone Skin: dry, intact, normal temperature Psychiatry: unable to assess due to lethargy, non-verbal   Data Reviewed:  Notable labs --- glucose 169, calcium 8.0 otherwise unremarkable BMP.  CBC with stable white count 10.6, hemoglobin 11.2.  Hemoglobin A1c 7.0  Family Communication: none at bedside will attempt to call daughter  Disposition: Status is: Inpatient Remains inpatient appropriate because: Severity of illness on IV therapies and ongoing encephalopathy   Planned Discharge Destination: Home versus SNF    Time spent: 45 minutes  Author: Pennie Banter, DO 12/12/2021 3:43 PM  For on call review [email or web address].

## 2021-12-12 NOTE — Assessment & Plan Note (Addendum)
Improved with resolving altered mental status. --SLP following --Started on dysphagia 2 diet (fine chop), thin liquids, meds crushed in puree/apple sauce --Stop maintenance IV fluids  --Monitor PO intake --Aspiration precautions

## 2021-12-12 NOTE — ED Notes (Signed)
MD notified of pts decline in mental state

## 2021-12-12 NOTE — H&P (Addendum)
[place]   PATIENT NAME: Natalie Adams    MR#:  [id number]  DATE OF BIRTH:  07-23-1929  DATE OF ADMISSION:  12/11/2021  PRIMARY CARE PHYSICIAN: Pcp, No   Patient is coming from: Home  REQUESTING/REFERRING PHYSICIAN:-X, Lysbeth Galas, MD  CHIEF COMPLAINT:   Chief Complaint  Patient presents with   Altered Mental Status    HISTORY OF PRESENT ILLNESS:  Natalie Adams is a 88 y.o. African-American female with medical history significant for asthma, osteoarthritis, type 2 diabetes mellitus and hypertension, who presented to the emergency room with acute onset of altered mental status with confusion.  The patient has not been having much appetite with associated decreased urine output.  She has been having loose frequent bowel movements without melena or bright red meat per rectum.  No fever or chills.  No cough or wheezing or dyspnea.  No chest pain or palpitations.  No paresthesias or focal muscle weakness.  She has been hurting all over.  No other bleeding diathesis.  ED Course: When she came to the ER, BP was 146/76 with respiratory rate of 23 and otherwise normal vital signs.  Labs revealed albumin of 3.2 with otherwise unremarkable CMP.  Lactic acid was 1.3 and later 1.6 and BNP 325.1 with high sensitive troponin I of 19.  CBC showed WBC of 10.8 and hemoglobin 11.6 hematocrit 38 with platelets of 239.  UA showed moderate hemoglobin and was otherwise unremarkable. EKG as reviewed by me : EKG showed sinus rhythm with a rate of 83 with probable left atrial enlargement, poor R wave progression and RSR-in V1 or V2. Imaging: Abdominal pelvic CT scan revealed the following: 1. Question of impacted stool in the rectum with developing mild stercoral colitis. 2. Mild hydronephrosis and perinephric stranding. This is similar to prior and may be chronic though recommend correlation with urinalysis to exclude infection. 3. Cholelithiasis without evidence of cholecystitis. 4. Aortic  atherosclerosis and aortic valve calcification. Noncontrast head CT scan revealed generalized atrophy and small vessel white matter disease, chronic right sphenoid sinusitis with no acute intracranial normalities. Portable chest ray showed interstitial prominence suggesting mild interstitial edema though atypical infection is not excluded.  It showed cardiomegaly.  The patient was given IV Zosyn, 500 mL IV normal saline and bolus and 10 mg PR Dulcolax.  She will be admitted to a medical telemetry bed for further evaluation and management.  PAST MEDICAL HISTORY:   Past Medical History:  Diagnosis Date   Arthritis    Asthma    Diabetes mellitus without complication ([place])    Hypertension    Morbid obesity ([place])     PAST SURGICAL HISTORY:   Past Surgical History:  Procedure Laterality Date   CATARACT EXTRACTION, BILATERAL     ORIF FEMUR FRACTURE Left 06/05/2020   Procedure: OPEN REDUCTION INTERNAL FIXATION (ORIF) DISTAL FEMUR FRACTURE;  Surgeon: Corky Mull, MD;  Location: [facility] ORS;  Service: Orthopedics;  Laterality: Left;    SOCIAL HISTORY:   Social History   Tobacco Use   Smoking status: Never   Smokeless tobacco: Never  Substance Use Topics   Alcohol use: Not on file    FAMILY HISTORY:   Family History  Problem Relation Age of Onset   Hypertension Mother     DRUG ALLERGIES:   Allergies  Allergen Reactions   Amoxicillin-Pot Clavulanate Diarrhea   Hydrochlorothiazide Other (See Comments)    "she got shaky and fell" "she got shaky and fell"  REVIEW OF SYSTEMS:   ROS As per history of present illness. All pertinent systems were reviewed above. Constitutional, HEENT, cardiovascular, respiratory, GI, GU, musculoskeletal, neuro, psychiatric, endocrine, integumentary and hematologic systems were reviewed and are otherwise negative/unremarkable except for positive findings mentioned above in the HPI.   MEDICATIONS AT HOME:   Prior to Admission medications    Medication Sig Start Date End Date Taking? Authorizing Provider  amLODipine (NORVASC) 10 MG tablet Take 5 mg by mouth daily. 05/01/17  Yes [provider]  ascorbic acid (VITAMIN C) 1000 MG tablet Take by mouth.   Yes [provider]  Dietary Management Product (ALGESIS) TABS Take by mouth.   Yes [provider]  Ferrous Fumarate (IRON) 18 MG TBCR Take 1 tablet (18 mg total) by mouth daily. Can take any over-the-counter iron supplement. 06/09/20  Yes Darlin Priestly, MD  insulin lispro (HUMALOG) 100 UNIT/ML injection Sliding scale insulin # of units = (BS-150/50) TID [facility] 05/09/16  Yes [provider]  insulin NPH Human (NOVOLIN N) 100 UNIT/ML injection Inject 14 Units into the skin daily before breakfast. 05/09/16  Yes [provider]  ipratropium-albuterol (DUONEB) 0.5-2.5 (3) MG/3ML SOLN SMARTSIG:1 Ampule(s) Via Nebulizer Every 6 Hours PRN 03/25/20  Yes [provider]  metFORMIN (GLUCOPHAGE) 1000 MG tablet Take 1,000 mg by mouth in the morning and at bedtime. 05/08/20  Yes [provider]  metoprolol tartrate (LOPRESSOR) 25 MG tablet Take 12.5 mg by mouth 2 (two) times daily. 05/08/20  Yes [provider]  Multiple Vitamins tablet Take by mouth. 02/11/11  Yes [provider]  Omega-3 Fatty Acids (FISH OIL) 1200 MG CAPS Take by mouth.   Yes [provider]  pravastatin (PRAVACHOL) 40 MG tablet Take 40 mg by mouth at bedtime. 05/08/20  Yes [provider]  acetaminophen (TYLENOL) 500 MG tablet Take 2 tablets (1,000 mg total) by mouth 2 (two) times daily as needed for mild pain or moderate pain. 06/09/20   Darlin Priestly, MD      VITAL SIGNS:  Blood pressure (!) 176/77, pulse 78, temperature 97.6 F (36.4 C), resp. rate (!) 25, height 5\' 7"  (1.702 m), weight 72.1 kg, SpO2 99 %.  PHYSICAL EXAMINATION:  Physical Exam  GENERAL:  88 y.o.-year-old African-American female patient lying in the bed with no acute distress.   EYES: Pupils equal, round, reactive to light and accommodation. No scleral icterus. Extraocular muscles intact.  HEENT: Head atraumatic, normocephalic. Oropharynx and nasopharynx clear.  NECK:  Supple, no jugular venous distention. No thyroid enlargement, no tenderness.  LUNGS: Normal breath sounds bilaterally, no wheezing, rales,rhonchi or crepitation. No use of accessory muscles of respiration.  CARDIOVASCULAR: Regular rate and rhythm, S1, S2 normal. No murmurs, rubs, or gallops.  ABDOMEN: Soft, nondistended, nontender. Bowel sounds present. No organomegaly or mass.  EXTREMITIES: No pedal edema, cyanosis, or clubbing.  NEUROLOGIC: Cranial nerves II through XII are intact. Muscle strength 5/5 in all extremities. Sensation intact. Gait not checked.  PSYCHIATRIC: The patient is alert and oriented x 3.  Normal affect and good eye contact. SKIN: No obvious rash, lesion, or ulcer.   LABORATORY PANEL:   CBC Recent Labs  Lab 12/11/21 2126  WBC 10.8*  HGB 11.6*  HCT 38.0  PLT 239   ------------------------------------------------------------------------------------------------------------------  Chemistries  Recent Labs  Lab 12/11/21 2126  NA 135  K 4.2  CL 104  CO2 23  GLUCOSE 125*  BUN 20  CREATININE 0.94  CALCIUM 9.3  AST 26  ALT  20  ALKPHOS 104  BILITOT 0.8   ------------------------------------------------------------------------------------------------------------------  Cardiac Enzymes No results for input(s): "TROPONINI" in the last 168 hours. ------------------------------------------------------------------------------------------------------------------  RADIOLOGY:  CT ABDOMEN PELVIS W CONTRAST  Result Date: 12/11/2021 CLINICAL DATA:  Abdominal pain EXAM: CT ABDOMEN AND PELVIS WITH CONTRAST TECHNIQUE: Multidetector CT imaging of the abdomen and pelvis was performed using the standard protocol following bolus administration of intravenous contrast. RADIATION  DOSE REDUCTION: This exam was performed according to the departmental dose-optimization program which includes automated exposure control, adjustment of the mA and/or kV according to patient size and/or use of iterative reconstruction technique. CONTRAST:  OMNIPAQUE IOHEXOL 300 MG/ML  SOLN COMPARISON:  CT abdomen and pelvis 10/09/2020 FINDINGS: Lower chest: Cardiomegaly. Aortic valve calcifications. Coronary artery and aortic atherosclerotic calcification. Respiratory motion obscures the lung bases. Evaluation of the upper abdomen is degraded by respiratory motion. Hepatobiliary: No focal hepatic abnormality. Cholelithiasis. Mild gallbladder distention. No evidence of cholecystitis. No biliary ductal dilation. Pancreas: Unremarkable. No pancreatic ductal dilatation or surrounding inflammatory changes. Spleen: Normal in size without focal abnormality. Adrenals/Urinary Tract: Adrenal glands are unremarkable. Similar hydronephrosis with perinephric stranding bilaterally. Mild bladder wall thickening, decreased from prior. No urinary calculi. Stomach/Bowel: Unremarkable stomach. Normal appendix. Large colonic stool burden in the right colon. Stool ball in the rectum with mild rectal wall thickening. Normal caliber large and small bowel. Vascular/Lymphatic: Aortic atherosclerosis. No enlarged abdominal or pelvic lymph nodes. Reproductive: Unremarkable. Other: No free intraperitoneal fluid or gas. Musculoskeletal: Demineralization.  Thoracolumbar spondylosis. IMPRESSION: 1. Question of impacted stool in the rectum with developing mild stercoral colitis. 2. Mild hydronephrosis and perinephric stranding. This is similar to prior and may be chronic though recommend correlation with urinalysis to exclude infection. 3. Cholelithiasis without evidence of cholecystitis. 4. Aortic atherosclerosis and aortic valve calcification. Electronically Signed   By: Minerva Fester M.D.   On: 12/11/2021 23:02   DG Chest Portable 1  View  Result Date: 12/11/2021 CLINICAL DATA:  Altered mental status EXAM: PORTABLE CHEST 1 VIEW COMPARISON:  Radiographs 10/09/2020 FINDINGS: Cardiomegaly. Aortic atherosclerotic calcification. Hazy bilateral interstitial opacities suggesting mild pulmonary edema. Bibasilar atelectasis. No definite pleural effusion or pneumothorax. No acute osseous abnormality. IMPRESSION: Interstitial prominence suggesting mild interstitial edema though atypical infection is not excluded. Cardiomegaly. Electronically Signed   By: Minerva Fester M.D.   On: 12/11/2021 22:22   CT HEAD WO CONTRAST ( )  Result Date: 12/11/2021 CLINICAL DATA:  Mental status change EXAM: CT HEAD WITHOUT CONTRAST TECHNIQUE: Contiguous axial images were obtained from the base of the skull through the vertex without intravenous contrast. RADIATION DOSE REDUCTION: This exam was performed according to the departmental dose-optimization program which includes automated exposure control, adjustment of the mA and/or kV according to patient size and/or use of iterative reconstruction technique. COMPARISON:  CT head 10/09/2020 FINDINGS: Brain: No intracranial hemorrhage, mass effect, or evidence of acute infarct. No hydrocephalus. No extra-axial fluid collection. Generalized cerebral atrophy. Ill-defined hypoattenuation within the cerebral white matter is nonspecific but consistent with chronic small vessel ischemic disease. Cystic structure in the anterior left frontal region is presumably extra-axial. Chronic right basal ganglia infarct. Vascular: No hyperdense vessel. Calcification of the intracranial internal carotid arteries. Skull: No fracture or focal lesion. Sinuses/Orbits: No acute finding. Mucosal thickening in the right ethmoid air cells and right sphenoid sinus. The opacification in the right sphenoid sinus demonstrates high attenuation. Hyperostosis about the right sphenoid sinus. Opacification of a few bilateral mastoid air cells. Other:  None. IMPRESSION: 1. No acute intracranial  abnormality. 2. Generalized atrophy and small vessel white matter disease. 3. Chronic right sphenoid sinusitis. Electronically Signed   By: Placido Sou M.D.   On: 12/11/2021 22:14      IMPRESSION AND PLAN:  Assessment and Plan: * Acute metabolic encephalopathy - The patient will be admitted to a medical telemetry bed. - This could be partly related to significant constipation. - We will follow neurochecks every 4 hours for 24 hours.   Stercoral colitis - It could be secondary to severe constipation. - We will place him on MiraLAX once as needed Dulcolax and milk of magnesia as well as Dulcolax and use Fleet enemas as needed. - Will be hydrated with IV normal saline. - We will continue him on IV Rocephin and Flagyl.  Acute urinary retention - This could be secondary to pressure with associated severe constipation and diminished urine output due to obstructive uropathy. - We will follow ins and outs with management of his constipation.  Type 2 diabetes mellitus without complication, with long-term current use of insulin ([place]) - The patient will be placed on supplement coverage with NovoLog. - We will continue his basal coverage. - We will hold off his metformin.  Dyslipidemia - We will continue statin therapy.  Essential hypertension -We will continue his antihypertensives.     DVT prophylaxis: Lovenox.  Advanced Care Planning:  Code Status:  DNR/DNI.  This was discussed with the patient Family Communication:  The plan of care was discussed in details with the patient (and family). I answered all questions. The patient agreed to proceed with the above mentioned plan. Further management will depend upon hospital course. Disposition Plan: Back to previous home environment Consults called: none.  All the records are reviewed and case discussed with ED provider.  Status is: Inpatient   At the time of the admission, it appears that  the appropriate admission status for this patient is inpatient.  This is judged to be reasonable and necessary in order to provide the required intensity of service to ensure the patient's safety given the presenting symptoms, physical exam findings and initial radiographic and laboratory data in the context of comorbid conditions.  The patient requires inpatient status due to high intensity of service, high risk of further deterioration and high frequency of surveillance required.  I certify that at the time of admission, it is my clinical judgment that the patient will require inpatient hospital care extending more than 2 midnights.                            Dispo: The patient is from: Home              Anticipated d/c is to: Home              Patient currently is not medically stable to d/c.              Difficult to place patient: No  Christel Mormon M.D on 12/12/2021 at 3:13 AM  [facility]   From 7 PM-7 AM, contact night-coverage [email or web address]  CC: Primary care physician; Pcp, No

## 2021-12-12 NOTE — Assessment & Plan Note (Signed)
-   She will be hydrated with IV normal saline. - Her  diarrhea could be overflow diarrhea with constipation.

## 2021-12-12 NOTE — ED Notes (Signed)
Pt is now looking at nurse and responding

## 2021-12-12 NOTE — ED Notes (Signed)
Pt's daughter Adeline Petitfrere [phone] would like to be contacted with mother goes up to inpatient

## 2021-12-13 DIAGNOSIS — R197 Diarrhea, unspecified: Secondary | ICD-10-CM | POA: Diagnosis present

## 2021-12-13 DIAGNOSIS — G9341 Metabolic encephalopathy: Secondary | ICD-10-CM | POA: Diagnosis not present

## 2021-12-13 DIAGNOSIS — R1312 Dysphagia, oropharyngeal phase: Secondary | ICD-10-CM | POA: Diagnosis not present

## 2021-12-13 DIAGNOSIS — K5289 Other specified noninfective gastroenteritis and colitis: Secondary | ICD-10-CM | POA: Diagnosis not present

## 2021-12-13 DIAGNOSIS — L8990 Pressure ulcer of unspecified site, unspecified stage: Secondary | ICD-10-CM | POA: Diagnosis present

## 2021-12-13 DIAGNOSIS — R338 Other retention of urine: Secondary | ICD-10-CM | POA: Diagnosis not present

## 2021-12-13 LAB — CBC
HCT: 33.7 % — ABNORMAL LOW (ref 36.0–46.0)
Hemoglobin: 10.4 g/dL — ABNORMAL LOW (ref 12.0–15.0)
MCH: 19.9 pg — ABNORMAL LOW (ref 26.0–34.0)
MCHC: 30.9 g/dL (ref 30.0–36.0)
MCV: 64.6 fL — ABNORMAL LOW (ref 80.0–100.0)
Platelets: 215 10*3/uL (ref 150–400)
RBC: 5.22 MIL/uL — ABNORMAL HIGH (ref 3.87–5.11)
RDW: 15.6 % — ABNORMAL HIGH (ref 11.5–15.5)
WBC: 9.3 10*3/uL (ref 4.0–10.5)
nRBC: 0 % (ref 0.0–0.2)

## 2021-12-13 LAB — BASIC METABOLIC PANEL
Anion gap: 9 (ref 5–15)
BUN: 19 mg/dL (ref 8–23)
CO2: 23 mmol/L (ref 22–32)
Calcium: 8.8 mg/dL — ABNORMAL LOW (ref 8.9–10.3)
Chloride: 107 mmol/L (ref 98–111)
Creatinine, Ser: 0.99 mg/dL (ref 0.44–1.00)
GFR, Estimated: 55 mL/min — ABNORMAL LOW (ref >60)
Glucose, Bld: 109 mg/dL — ABNORMAL HIGH (ref 70–99)
Potassium: 3.6 mmol/L (ref 3.5–5.1)
Sodium: 139 mmol/L (ref 135–145)

## 2021-12-13 LAB — GLUCOSE, CAPILLARY
Glucose-Capillary: 131 mg/dL — ABNORMAL HIGH (ref 70–99)
Glucose-Capillary: 187 mg/dL — ABNORMAL HIGH (ref 70–99)
Glucose-Capillary: 145 mg/dL — ABNORMAL HIGH (ref 70–99)
Glucose-Capillary: 145 mg/dL — ABNORMAL HIGH (ref 70–99)

## 2021-12-13 NOTE — Progress Notes (Signed)
Speech Language Pathology Treatment:    Patient Details Name: Natalie Adams MRN: [id number] DOB: 07-13-1929 Today's Date: 12/13/2021 Time: 2025-4270 SLP Time Calculation (min) (ACUTE ONLY): 17 min  Assessment / Plan / Recommendation Clinical Impression  Pt seen for clinical swallowing evaluation. Pt alert and cooperative. HOH. On room air. Limited verbalizations. Confusion noted. Daughter, Natalie Adams, at bedside. Daughter noted pt consumed meats "chopped" in food processor with thin liquids and takes medication whole in applesauce PTA. Additionally, daughter endorsed pt typically able to feed self.  Pt given trials of solid (x1 saltine cracker; presented in quarters), pureed (~2 oz), and thin liquids (via straw; ~3 oz). Pt presents with s/sx mild-moderate oral dysphagia c/b prolonged/inefficient mastication of solids. Adequate oral phase noted across other textures. Oral deficits likely due to dental status and likely exacerbated by mental status. No overt s/sx pharyngeal dysphagia noted across trials. To palpation, pt with seemingly timely swallow initiation and seemingly adequate laryngeal elevation to palpation. No change to vocal quality across trials.  Pt unable to feed self this date.   Recommend initiation of Dysphagia 2 (Chopped) Diet with Thin Liquids and safe swallowing strategies/aspiration precautions as outlined below.   Pt is at increased risk for aspiration/aspiration PNA given dental status, mental status, dependence for feeding at present, and medical comorbidities.   SLP to f/u per POC for diet tolerance.   Pt's daughter and RN made aware of results, recommendations, and SLP POC. Pt's daughter verbalized understanding/agreement.    HPI HPI: Per H&P "Natalie Adams is a 88 y.o. African-American female with medical history significant for asthma, osteoarthritis, type 2 diabetes mellitus and hypertension, who presented to the emergency room with acute onset of altered mental status  with confusion.  The patient has not been having much appetite with associated decreased urine output.  She has been having loose frequent bowel movements without melena or bright red meat per rectum.  No fever or chills.  No cough or wheezing or dyspnea.  No chest pain or palpitations.  No paresthesias or focal muscle weakness.  She has been hurting all over.  No other bleeding diathesis.     ED Course: When she came to the ER, BP was 146/76 with respiratory rate of 23 and otherwise normal vital signs.  Labs revealed albumin of 3.2 with otherwise unremarkable CMP.  Lactic acid was 1.3 and later 1.6 and BNP 325.1 with high sensitive troponin I of 19.  CBC showed WBC of 10.8 and hemoglobin 11.6 hematocrit 38 with platelets of 239.  UA showed moderate hemoglobin and was otherwise unremarkable.  EKG as reviewed by me : EKG showed sinus rhythm with a rate of 83 with probable left atrial enlargement, poor R wave progression and RSR-in V1 or V2.  Imaging: Abdominal pelvic CT scan revealed the following:  1. Question of impacted stool in the rectum with developing mild  stercoral colitis.  2. Mild hydronephrosis and perinephric stranding. This is similar to  prior and may be chronic though recommend correlation with  urinalysis to exclude infection.  3. Cholelithiasis without evidence of cholecystitis.  4. Aortic atherosclerosis and aortic valve calcification.  Noncontrast head CT scan revealed generalized atrophy and small vessel white matter disease, chronic right sphenoid sinusitis with no acute intracranial normalities.  Portable chest ray showed interstitial prominence suggesting mild interstitial edema though atypical infection is not excluded.  It showed cardiomegaly.     The patient was given IV Zosyn, 500 mL IV normal saline and bolus and 10 mg PR  Dulcolax.  She will be admitted to a medical telemetry bed for further evaluation and management."      SLP Plan  Continue with current plan of care       Recommendations for follow up therapy are one component of a multi-disciplinary discharge planning process, led by the attending physician.  Recommendations may be updated based on patient status, additional functional criteria and insurance authorization.    Recommendations  Diet recommendations: Dysphagia 2 (fine chop);Thin liquid Medication Administration: Crushed with puree Supervision: Staff to assist with self feeding;Full supervision/cueing for compensatory strategies Compensations: Minimize environmental distractions;Slow rate;Small sips/bites;Follow solids with liquid Postural Changes and/or Swallow Maneuvers: Seated upright 90 degrees;Upright 30-60 min after meal                Oral Care Recommendations: Oral care QID;Staff/trained caregiver to provide oral care Follow Up Recommendations:  (TBD; likely no f/u SLP needs) Assistance recommended at discharge: Frequent or constant Supervision/Assistance SLP Visit Diagnosis: Dysphagia, oral phase (R13.11) Plan: Continue with current plan of care          Natalie Adams, M.S., CCC-SLP Speech-Language Pathologist [facility] [phone] Natalie Adams)  Natalie Adams  12/13/2021, 9:41 AM

## 2021-12-13 NOTE — Assessment & Plan Note (Addendum)
Pt reportedly passing loose watery stools at home, but CT scan showed stool impaction of the rectum. Diarrhea likely passing around impaction and should improve with relief of the impaction.  No abdominal pain or nausea/vomiting.  No fever or leukocytosis.   Acute gastritis - ruled out. --Aggressive bowel regimen --Stop antibiotics and monitor clinically --Monitor closely

## 2021-12-13 NOTE — Assessment & Plan Note (Signed)
POA. I agree with the wound description as outlined below.  Continue diligent wound care, frequent repositioning and monitor closely for signs of infection.  Pressure Injury 12/12/21 Buttocks Left Stage 2 -  Partial thickness loss of dermis presenting as a shallow open injury with a red, pink wound bed without slough. (Active)  12/12/21 1812  Location: Buttocks  Location Orientation: Left  Staging: Stage 2 -  Partial thickness loss of dermis presenting as a shallow open injury with a red, pink wound bed without slough.  Wound Description (Comments):   Present on Admission: Yes

## 2021-12-13 NOTE — Progress Notes (Signed)
Progress Note   Patient: Natalie Adams ZWC:[id number] DOB: [birth year] DOA: 12/11/2021     2 DOS: the patient was seen and examined on 12/13/2021   Brief hospital course: HPI on admission: "88 y.o. African-American female with medical history significant for asthma, osteoarthritis, type 2 diabetes mellitus and hypertension, who presented to the emergency room with ...altered mental status with confusion.  The patient has not been having much appetite with associated decreased urine output.  She has been having loose frequent bowel movements without melena or bright red meat per rectum.... "    In the ED, patient was tachypneic with BP mildly elevated 146/76 with otherwise normal vitals.  Labs were notable for normal lactic acid, elevated BNP 325.1, high sensitive troponin 19 with non-acute EKG.  CBC with very mild anemia hemoglobin 11.6 and mild leukocytosis WBC 10.8.  CT of abdomen pelvis showed stool impacted rectum with mild stercoral colitis, mild hydronephrosis and perinephric stranding present on prior scans and felt to be chronic. Noncontrast head CT showed generalized atrophy and ischemic small vessel disease but no acute findings.  Chest x-ray showed mild cardiomegaly with mild interstitial prominence most likely edema with atypical infection not excluded.  Patient mid to the hospital and started on empiric antibiotics and for further evaluation of acute encephalopathy.    8/13: mental status at baseline, per daughter.  PT/OT evaluations pending.  Monitor off antibiotics and fluids.   Assessment and Plan: * Acute metabolic encephalopathy POA, resolved.  Suspected due to stool impaction and urinary retention, infection not yet ruled out.  Started on empiric antibiotics on admission. Patient has baseline dementia but able to feed self and make needs known, per daughter --Manage underlying conditions as outlined --Neuro checks --Delirium precautions   Diarrhea Pt reportedly passing  loose watery stools at home, but CT scan showed stool impaction of the rectum. Diarrhea likely passing around impaction and should improve with relief of the impaction.  No abdominal pain or nausea/vomiting.  No fever or leukocytosis.   Acute gastritis - ruled out. --Aggressive bowel regimen --Monitor closely  Stercoral colitis Likely secondary to severe constipation. Loose stools reported but stool impaction of rectum seen on CT scan. Liquid stool passing around impacted stool. --Aggressive bowel regimen including suppositories and enemas --Hydration - IV fluids - Continue  on IV Rocephin and Flagyl for now  Dysphagia Improved with resolving altered mental status. --SLP following --Started on dysphagia 2 diet (fine chop), thin liquids, meds crushed in puree/apple sauce --Stop maintenance IV fluids  --Monitor PO intake --Aspiration precautions  Dyslipidemia --Continue statin therapy.  Acute urinary retention Resolved.  POA, likely secondary to pressure with associated severe constipation. -- Monitor urine output -- And out cath if retaining >500 cc and not voiding  Essential hypertension --Continue home antihypertensives  Type 2 diabetes mellitus without complication, with long-term current use of insulin (HCC) Hbg A1c 7.0%, poorly controlled. --Sliding scale NovoLog -- Continue his basal coverage. - Hold metformin.  Pressure injury of skin POA. I agree with the wound description as outlined below.  Continue diligent wound care, frequent repositioning and monitor closely for signs of infection.  Pressure Injury 12/12/21 Buttocks Left Stage 2 -  Partial thickness loss of dermis presenting as a shallow open injury with a red, pink wound bed without slough. (Active)  12/12/21 1812  Location: Buttocks  Location Orientation: Left  Staging: Stage 2 -  Partial thickness loss of dermis presenting as a shallow open injury with a red, pink wound bed without  slough.  Wound  Description (Comments):   Present on Admission: Yes     Acute gastroenteritis-resolved as of 12/13/2021 - She will be hydrated with IV normal saline. - Her  diarrhea could be overflow diarrhea with constipation.        Subjective: Pt seen with daughter at bedside on rounds today.  Daughter reports patient's mental status is much improved and at her baseline.  Patient's tolerating diet and is awake and alert today.  Daughter reports use of Hoyer lift and a wheelchair at home but is agreeable to SNF for short-term rehab if recommended as she hopes patient can regain her ability to ambulate at least short distances.   Physical Exam: Vitals:   12/13/21 0450 12/13/21 0835 12/13/21 0836 12/13/21 1046  BP: 123/66 (!) 131/98  121/62  Pulse: 80 (!) 101 74 80  Resp: 18 18    Temp: (!) 97.3 F (36.3 C) 98.3 F (36.8 C)    TempSrc: Oral     SpO2: 98% (!) 76% 99%   Weight:      Height:       General exam: Awake and alert, no acute distress HEENT: moist mucus membranes, clear conjunctiva Respiratory system: CTAB with diminished bases, no wheezes, on room air, normal respiratory effort. Cardiovascular system: normal S1/S2, RRR,o pedal edema.   Gastrointestinal system: Abdomen soft nontender nondistended Central nervous system: Exam limited due to dementia but grossly nonfocal exam Extremities: no edema, normal tone Skin: dry, intact, normal temperature Psychiatry: Normal mood and affect, abnormal judgment and insight due to dementia   Data Reviewed:  Notable labs ---  glucose 109, calcium 8.8, GFR 55 with creatinine 0.99.  CBC with hemoglobin 10.4 and mild leukocytosis resolved white count 9.3  Family Communication: daughter at bedside on rounds this morning  Disposition: Status is: Inpatient Remains inpatient appropriate because: Monitoring off antibiotics and IV fluids with anticipated discharge if stable in 24 hours.  PT OT evaluations pending.   Planned Discharge Destination:  Home versus SNF    Time spent: 40 minutes  Author: Pennie Banter, DO 12/13/2021 12:55 PM  For on call review [email or web address].

## 2021-12-13 NOTE — Evaluation (Signed)
Physical Therapy Evaluation Patient Details Name: Natalie Adams MRN: [id number] DOB: 1929-08-19 Today's Date: 12/13/2021  History of Present Illness  Pt is an 88 y/o F admitted on 12/11/21 who presented to the ED with c/o AMS & confusion. Pt is being treated for acute metabolic encephalopathy & stercoral colitis likely 2/2 severe constipation. PMH: asthma, OA, DM2, HTN  Clinical Impression  Pt seen for PT evaluation with daughter present for session. Per daughter, prior to admission pt was able to roll in bed without assistance but they used hoyer lift to transfer pt to w/c. On this date, pt requires max assist to roll L<>R for bedpan placement/removal (small continent void only). Pt is unable to follow cuing for BLE/BUE placement to increase ease of rolling but question if this is due to [facility]. Will keep pt on acute PT caseload to increase strength & independence with rolling in bed to facilitate return to PLOF to decrease caregiver burden.       Recommendations for follow up therapy are one component of a multi-disciplinary discharge planning process, led by the attending physician.  Recommendations may be updated based on patient status, additional functional criteria and insurance authorization.  Follow Up Recommendations Home health PT      Assistance Recommended at Discharge Frequent or constant Supervision/Assistance  Patient can return home with the following  A lot of help with bathing/dressing/bathroom;Assistance with feeding    Equipment Recommendations None recommended by PT  Recommendations for Other Services  OT consult (for feeding)    Functional Status Assessment Patient has had a recent decline in their functional status and demonstrates the ability to make significant improvements in function in a reasonable and predictable amount of time.     Precautions / Restrictions Precautions Precautions: Fall Restrictions Weight Bearing Restrictions: No      Mobility  Bed  Mobility Overal bed mobility: Needs Assistance Bed Mobility: Rolling Rolling: Max assist         General bed mobility comments: Pt limited by [facility] regarding cuing for BUE/BLE placement, requires max assist for rolling L<>R    Transfers                        Ambulation/Gait                  Stairs            Wheelchair Mobility    Modified Rankin (Stroke Patients Only)       Balance                                             Pertinent Vitals/Pain Pain Assessment Pain Assessment: Faces Faces Pain Scale: No hurt    Home Living Family/patient expects to be discharged to:: Private residence Living Arrangements: Children Available Help at Discharge: Family;Available 24 hours/day Type of Home: House Home Access: Ramped entrance         Home Equipment: Wheelchair - manual Additional Comments: hoyer lift, bed rails on elevated bed    Prior Function Prior Level of Function : Needs assist             Mobility Comments: Pt was able to complete rolling L<>R without assistance but daughter used hoyer lift to transfer pt to w/c where she was able to reposition her trunk PRN & used BLE exercise pedaling machine.  Hand Dominance        Extremity/Trunk Assessment   Upper Extremity Assessment Upper Extremity Assessment:  (Limited L shoulder flexion 2/2 old shoulder injury (dislocation but daughter reports it was fixed))    Lower Extremity Assessment Lower Extremity Assessment: Generalized weakness       Communication   Communication: HOH  Cognition Arousal/Alertness: Lethargic Behavior During Therapy: WFL for tasks assessed/performed Overall Cognitive Status: Difficult to assess                                 General Comments: Pt oriented to self, location but not year or situation        General Comments      Exercises     Assessment/Plan    PT Assessment Patient needs continued PT  services  PT Problem List Decreased strength;Decreased activity tolerance;Decreased mobility       PT Treatment Interventions Therapeutic exercise;Functional mobility training;Therapeutic activities;Patient/family education    PT Goals (Current goals can be found in the Care Plan section)  Acute Rehab PT Goals Patient Stated Goal: return to PLOF PT Goal Formulation: With family Time For Goal Achievement: 12/27/21 Potential to Achieve Goals: Fair    Frequency Min 2X/week     Co-evaluation               AM-PAC PT "6 Clicks" Mobility  Outcome Measure Help needed turning from your back to your side while in a flat bed without using bedrails?: Total Help needed moving from lying on your back to sitting on the side of a flat bed without using bedrails?: Total Help needed moving to and from a bed to a chair (including a wheelchair)?: Total Help needed standing up from a chair using your arms (e.g., wheelchair or bedside chair)?: Total Help needed to walk in hospital room?: Total Help needed climbing 3-5 steps with a railing? : Total 6 Click Score: 6    End of Session   Activity Tolerance: Patient tolerated treatment well;Patient limited by fatigue Patient left: in bed;with call bell/phone within reach;with bed alarm set;with family/visitor present Nurse Communication: Mobility status PT Visit Diagnosis: Other abnormalities of gait and mobility (R26.89);Muscle weakness (generalized) (M62.81)    Time: 4431-5400 PT Time Calculation (min) (ACUTE ONLY): 23 min   Charges:   PT Evaluation $PT Eval Moderate Complexity: 1 Mod          Aleda Grana, PT, DPT 12/13/21, 2:43 PM   Sandi Mariscal 12/13/2021, 2:42 PM

## 2021-12-14 DIAGNOSIS — E876 Hypokalemia: Secondary | ICD-10-CM | POA: Diagnosis not present

## 2021-12-14 LAB — BASIC METABOLIC PANEL
Anion gap: 7 (ref 5–15)
BUN: 18 mg/dL (ref 8–23)
CO2: 26 mmol/L (ref 22–32)
Calcium: 8.6 mg/dL — ABNORMAL LOW (ref 8.9–10.3)
Chloride: 105 mmol/L (ref 98–111)
Creatinine, Ser: 0.71 mg/dL (ref 0.44–1.00)
GFR, Estimated: >60 mL/min (ref >60)
Glucose, Bld: 99 mg/dL (ref 70–99)
Potassium: 3.1 mmol/L — ABNORMAL LOW (ref 3.5–5.1)
Sodium: 138 mmol/L (ref 135–145)

## 2021-12-14 LAB — GLUCOSE, CAPILLARY
Glucose-Capillary: 91 mg/dL (ref 70–99)
Glucose-Capillary: 99 mg/dL (ref 70–99)
Glucose-Capillary: 100 mg/dL — ABNORMAL HIGH (ref 70–99)
Glucose-Capillary: 129 mg/dL — ABNORMAL HIGH (ref 70–99)
Glucose-Capillary: 153 mg/dL — ABNORMAL HIGH (ref 70–99)

## 2021-12-14 LAB — MAGNESIUM: Magnesium: 1.6 mg/dL — ABNORMAL LOW (ref 1.7–2.4)

## 2021-12-14 MED ORDER — POTASSIUM CHLORIDE CRYS ER 20 MEQ PO TBCR
40.0000 meq | EXTENDED_RELEASE_TABLET | ORAL | Status: AC
  Administered 2021-12-14 (×2): 40 meq via ORAL
  Filled 2021-12-14 (×2): qty 2

## 2021-12-14 MED ORDER — MAGNESIUM SULFATE 4 GM/100ML IV SOLN
4.0000 g | Freq: Once | INTRAVENOUS | Status: AC
Start: 2021-12-14 — End: 2021-12-14
  Administered 2021-12-14: 4 g via INTRAVENOUS
  Filled 2021-12-14: qty 100

## 2021-12-14 NOTE — TOC Progression Note (Signed)
Transition of Care [facility]) - Progression Note    Patient Details  Name: Natalie Adams MRN: [id number] Date of Birth: May 15, 1929  [facility]) CM/SW Contact  Truddie Hidden, RN Phone Number: 12/14/2021, 9:36 PM  Clinical Narrative:    Spoke with patient's daughter Natalie Adams regarding discharge plan. Patient daughter stated patient was previously seen by [employer] until the organization was bought out by [employer]. Patient is in the process of being assigned a PCP. She expects to hear back from the agency on who they assigned PCP is and appointments. Advised HH could not be arranged until a PCP is established. Referral send to [facility] for  Patient will require EMS transportation to home address. Daughter Advised of likely discharge home tomorrow.         Expected Discharge Plan and Services                                                 Social Determinants of Health (SDOH) Interventions    Readmission Risk Interventions     No data to display

## 2021-12-14 NOTE — Progress Notes (Signed)
OT Cancellation Note  Patient Details Name: Natalie Adams MRN: [id number] DOB: 18-Dec-1929   Cancelled Treatment:    Reason Eval/Treat Not Completed: Other (comment). Consult received, chart reviewed. Upon attempt, pt in bed, endorses being comfortable and requesting OT call her daughter. Per daughter, she is on the way to the hospital now. Pt notified. Pt difficult to understand. Will re-attempt OT evaluation once daughter is here to help facilitate communication.   Arman Filter., MPH, MS, OTR/L ascom [phone] 12/14/21, 10:15 AM

## 2021-12-14 NOTE — Progress Notes (Signed)
Physical Therapy Treatment Patient Details Name: Natalie Adams MRN: [id number] DOB: 06-30-1929 Today's Date: 12/14/2021   History of Present Illness Pt is an 88 y/o F admitted on 12/11/21 who presented to the ED with c/o AMS & confusion. Pt is being treated for acute metabolic encephalopathy & stercoral colitis likely 2/2 severe constipation. PMH: asthma, OA, DM2, HTN    PT Comments    Pt's daughter present throughout session, very attentive and caring of her mother. Pt on bed pan upon PT arrival and pt's daughter requesting assistance with getting pt off of bed pan and repositioned. Pt continues to require heavy physical assistance for bed mobility but pt's daughter reporting that she is getting closer to her baseline in regards to functional mobility. Pt would continue to benefit from skilled physical therapy services at this time while admitted and after d/c to address the below listed limitations in order to improve overall safety and independence with functional mobility.      Recommendations for follow up therapy are one component of a multi-disciplinary discharge planning process, led by the attending physician.  Recommendations may be updated based on patient status, additional functional criteria and insurance authorization.  Follow Up Recommendations  Home health PT     Assistance Recommended at Discharge Frequent or constant Supervision/Assistance  Patient can return home with the following A lot of help with bathing/dressing/bathroom;Assistance with feeding   Equipment Recommendations  None recommended by PT    Recommendations for Other Services       Precautions / Restrictions Precautions Precautions: Fall Restrictions Weight Bearing Restrictions: No     Mobility  Bed Mobility Overal bed mobility: Needs Assistance Bed Mobility: Rolling Rolling: Max assist, +2 for physical assistance         General bed mobility comments: multimodal cueing for sequencing for  rolling bilaterally multiple times for removal of bed pan and pericare    Transfers                   General transfer comment: pt uses a Nurse, adult at baseline    Ambulation/Gait                   Social research officer, government Rankin (Stroke Patients Only)       Balance                                            Cognition Arousal/Alertness: Awake/alert Behavior During Therapy: WFL for tasks assessed/performed Overall Cognitive Status: Difficult to assess                                          Exercises      General Comments        Pertinent Vitals/Pain Pain Assessment Pain Assessment: Faces Faces Pain Scale: No hurt Pain Intervention(s): Monitored during session    Home Living                          Prior Function            PT Goals (current goals can now be found in the care plan section) Acute Rehab PT Goals PT  Goal Formulation: With family Time For Goal Achievement: 12/27/21 Potential to Achieve Goals: Fair Progress towards PT goals: Progressing toward goals    Frequency    Min 2X/week      PT Plan Current plan remains appropriate    Co-evaluation              AM-PAC PT "6 Clicks" Mobility   Outcome Measure  Help needed turning from your back to your side while in a flat bed without using bedrails?: Total Help needed moving from lying on your back to sitting on the side of a flat bed without using bedrails?: Total Help needed moving to and from a bed to a chair (including a wheelchair)?: Total Help needed standing up from a chair using your arms (e.g., wheelchair or bedside chair)?: Total Help needed to walk in hospital room?: Total Help needed climbing 3-5 steps with a railing? : Total 6 Click Score: 6    End of Session   Activity Tolerance: Patient tolerated treatment well Patient left: in bed;with call bell/phone within  reach;with bed alarm set;with family/visitor present Nurse Communication: Mobility status PT Visit Diagnosis: Other abnormalities of gait and mobility (R26.89);Muscle weakness (generalized) (M62.81)     Time: 3005-1102 PT Time Calculation (min) (ACUTE ONLY): 19 min  Charges:  $Therapeutic Activity: 8-22 mins                     Arletta Bale, DPT  Acute Rehabilitation Services Office [phone]    Alessandra Bevels Gertude Benito 12/14/2021, 1:07 PM

## 2021-12-14 NOTE — Assessment & Plan Note (Signed)
K 3.3 this AM, replacing with PO K-Cl 40 mEq x2 --Monitor BMP, replace K PRN

## 2021-12-14 NOTE — Assessment & Plan Note (Signed)
Mg 1.6 this AM, replacing with IV Mg-sulfate 4g --Monitor & replace Mg PRN

## 2021-12-14 NOTE — Evaluation (Signed)
Occupational Therapy Evaluation Patient Details Name: Natalie Adams MRN: [id number] DOB: 1929/10/20 Today's Date: 12/14/2021   History of Present Illness Pt is an 88 y/o F admitted on 12/11/21 who presented to the ED with c/o AMS & confusion. Pt is being treated for acute metabolic encephalopathy & stercoral colitis likely 2/2 severe constipation. PMH: asthma, OA, DM2, HTN   Clinical Impression   Pt was seen for OT evaluation this date. Prior to hospital admission, pt was requiring significant assist for ADL and mobility, using hoyer lift for transfers. Pt lives with her daughter who provides all of her care. Pt presents to acute OT demonstrating impaired ADL performance and functional mobility 2/2 decreased strength, activity tolerance, balance, near baseline per daughter's report (See OT problem list). Daughter reports that on admission, pt was unable to feed herself or hold a cup which is far from her baseline but improving today. Pt able to demo ability to hold her cup and bring to mouth to drink with set up for the cup. Daughter pleased with improvements. Pt washed her face with setup and cue to initiate. MAX A for boosting up in bed. Daughter reports that she feels pt is close to baseline and feels comfortable providing current level of care. Declines additional skilled OT services at this time. Will sign off.     Recommendations for follow up therapy are one component of a multi-disciplinary discharge planning process, led by the attending physician.  Recommendations may be updated based on patient status, additional functional criteria and insurance authorization.   Follow Up Recommendations  No OT follow up    Assistance Recommended at Discharge Frequent or constant Supervision/Assistance  Patient can return home with the following A lot of help with walking and/or transfers;A lot of help with bathing/dressing/bathroom;Assistance with cooking/housework;Assist for transportation;Help with  stairs or ramp for entrance;Direct supervision/assist for medications management;Direct supervision/assist for financial management    Functional Status Assessment  Patient has not had a recent decline in their functional status  Equipment Recommendations  None recommended by OT    Recommendations for Other Services       Precautions / Restrictions Precautions Precautions: Fall Restrictions Weight Bearing Restrictions: No      Mobility Bed Mobility Overal bed mobility: Needs Assistance             General bed mobility comments: MAX A +2 for boosting up in the bed    Transfers                          Balance                                           ADL either performed or assessed with clinical judgement   ADL Overall ADL's : At baseline                                       General ADL Comments: Pt set up with cup and able to drink without direct assist, pt's dtr endorses this is much better than at admission, and close to baseline for pt; pt at baseline for all other ADL/mobility     Vision         Perception     Praxis  Pertinent Vitals/Pain Pain Assessment Pain Assessment: Faces Faces Pain Scale: No hurt     Hand Dominance     Extremity/Trunk Assessment Upper Extremity Assessment Upper Extremity Assessment: Generalized weakness;LUE deficits/detail LUE Deficits / Details: Limited L shoulder flexion 2/2 old shoulder injury (dislocation but daughter reports it was fixed), old injury resulting in tendency of pt to keep hand in composite finger flexion/fisted, can get near full finger extension with assist   Lower Extremity Assessment Lower Extremity Assessment: Generalized weakness       Communication Communication Communication: HOH   Cognition Arousal/Alertness: Awake/alert Behavior During Therapy: WFL for tasks assessed/performed Overall Cognitive Status: Difficult to assess                                        General Comments       Exercises Other Exercises Other Exercises: Pt/dtr instructed in AROM/PROM stretching for L hand to minimize functional deficits and risk of contracture   Shoulder Instructions      Home Living Family/patient expects to be discharged to:: Private residence Living Arrangements: Children Available Help at Discharge: Family;Available 24 hours/day Type of Home: House Home Access: Ramped entrance                     Home Equipment: Wheelchair - manual   Additional Comments: hoyer lift, bed rails on elevated bed      Prior Functioning/Environment Prior Level of Function : Needs assist             Mobility Comments: Pt was able to complete rolling L<>R without assistance but daughter used hoyer lift to transfer pt to w/c where she was able to reposition her trunk PRN & used BLE exercise pedaling machine. ADLs Comments: Pt was able to self feed and do some grooming tasks but required significant assist for all other ADL/IADL        OT Problem List: Decreased range of motion;Decreased strength;Impaired balance (sitting and/or standing);Decreased activity tolerance      OT Treatment/Interventions:      OT Goals(Current goals can be found in the care plan section) Acute Rehab OT Goals Patient Stated Goal: go home OT Goal Formulation: All assessment and education complete, DC therapy  OT Frequency:      Co-evaluation              AM-PAC OT "6 Clicks" Daily Activity     Outcome Measure Help from another person eating meals?: None Help from another person taking care of personal grooming?: A Little Help from another person toileting, which includes using toliet, bedpan, or urinal?: Total Help from another person bathing (including washing, rinsing, drying)?: Total Help from another person to put on and taking off regular upper body clothing?: A Lot Help from another person to put on and taking off  regular lower body clothing?: Total 6 Click Score: 12   End of Session    Activity Tolerance: Patient tolerated treatment well Patient left: in bed;with call bell/phone within reach;with bed alarm set;with family/visitor present  OT Visit Diagnosis: Other abnormalities of gait and mobility (R26.89)                Time: 1308-6578 OT Time Calculation (min): 18 min Charges:  OT General Charges $OT Visit: 1 Visit OT Evaluation $OT Eval Low Complexity: 1 Low OT Treatments $Therapeutic Activity: 8-22 mins  Arman Filter., MPH, MS, OTR/L  ascom [phone] 12/14/21, 4:49 PM

## 2021-12-14 NOTE — Progress Notes (Signed)
Progress Note   Patient: Natalie Adams XAJ:[id number] DOB: 05/10/1929 DOA: 12/11/2021     3 DOS: the patient was seen and examined on 12/14/2021   Brief hospital course: HPI on admission: "88 y.o. African-American female with medical history significant for asthma, osteoarthritis, type 2 diabetes mellitus and hypertension, who presented to the emergency room with ...altered mental status with confusion.  The patient has not been having much appetite with associated decreased urine output.  She has been having loose frequent bowel movements without melena or bright red meat per rectum.... "    In the ED, patient was tachypneic with BP mildly elevated 146/76 with otherwise normal vitals.  Labs were notable for normal lactic acid, elevated BNP 325.1, high sensitive troponin 19 with non-acute EKG.  CBC with very mild anemia hemoglobin 11.6 and mild leukocytosis WBC 10.8.  CT of abdomen pelvis showed stool impacted rectum with mild stercoral colitis, mild hydronephrosis and perinephric stranding present on prior scans and felt to be chronic. Noncontrast head CT showed generalized atrophy and ischemic small vessel disease but no acute findings.  Chest x-ray showed mild cardiomegaly with mild interstitial prominence most likely edema with atypical infection not excluded.  Patient mid to the hospital and started on empiric antibiotics and for further evaluation of acute encephalopathy.   8/13: mental status at baseline, per daughter.   Monitor off antibiotics and fluids.   Assessment and Plan: * Acute metabolic encephalopathy POA, resolved.  Suspected due to stool impaction and urinary retention.  Started on empiric antibiotics on admission.   No evidence of infection. Antibiotics have been stopped and pt continues to do well, mental status at baseline. Patient has baseline dementia but able to feed self and make needs known, per daughter --Manage underlying conditions as outlined including bowel  regimen and monitoring for urinary retention --Neuro checks have been unremarkable --Delirium precautions   Diarrhea Pt reportedly passing loose watery stools at home, but CT scan showed stool impaction of the rectum. Diarrhea likely passing around impaction and should improve with relief of the impaction.  No abdominal pain or nausea/vomiting.  No fever or leukocytosis.   Acute gastritis - ruled out. --Aggressive bowel regimen --Stop antibiotics and monitor clinically --Monitor closely  Stercoral colitis Likely secondary to severe constipation. Loose stools reported but stool impaction of rectum seen on CT scan. Liquid stool passing around impacted stool. --Aggressive bowel regimen including suppositories and enemas --Treated with IV fluids and empiric IV antibiotics initially. --Stable off antibiotics, monitor   Dysphagia Improved with resolving altered mental status. --SLP following --Started on dysphagia 2 diet (fine chop), thin liquids, meds crushed in puree/apple sauce --Stop maintenance IV fluids  --Monitor PO intake --Aspiration precautions  Dyslipidemia --Continue statin therapy.  Acute urinary retention Resolved.   POA, ?secondary to severe constipation. -- Monitor urine output -- In/out cath if >500 cc on bladder scan  Essential hypertension --Continue home antihypertensives  Type 2 diabetes mellitus without complication, with long-term current use of insulin (HCC) Hbg A1c 7.0%, poorly controlled. --Sliding scale NovoLog -- Continue his basal coverage. - Hold metformin.  Pressure injury of skin POA. I agree with the wound description as outlined below.  Continue diligent wound care, frequent repositioning and monitor closely for signs of infection.  Pressure Injury 12/12/21 Buttocks Left Stage 2 -  Partial thickness loss of dermis presenting as a shallow open injury with a red, pink wound bed without slough. (Active)  12/12/21 1812  Location: Buttocks   Location Orientation: Left  Staging: Stage 2 -  Partial thickness loss of dermis presenting as a shallow open injury with a red, pink wound bed without slough.  Wound Description (Comments):   Present on Admission: Yes     Hypomagnesemia Mg 1.6 this AM, replacing with IV Mg-sulfate 4g --Monitor & replace Mg PRN   Hypokalemia K 3.3 this AM, replacing with PO K-Cl 40 mEq x2 --Monitor BMP, replace K PRN  Acute gastroenteritis-resolved as of 12/13/2021 - She will be hydrated with IV normal saline. - Her  diarrhea could be overflow diarrhea with constipation.        Subjective: Pt seen with daughter at bedside on rounds today.  Daughter reports patient's mental status remains at her baseline.  No reports of urinary retention recurrence.  No fevers.  Pt eating and drinking well.  Daughter agreeable to [facility] services She reports pt was followed by doctors making house calls, but it was bought out and new company does not do house calls.  Needs to establish with new PCP for patient and needs transportation assistance if not able to do house calls.  Patient denies pain or feeling sick.   Physical Exam: Vitals:   12/13/21 2005 12/14/21 0449 12/14/21 0825 12/14/21 1604  BP: 116/68 (!) 147/73 (!) 153/64 138/62  Pulse: 62 76 74 66  Resp: 16 16 17 14   Temp: (!) 97.3 F (36.3 C) (!) 97.5 F (36.4 C) 97.8 F (36.6 C) (!) 97.5 F (36.4 C)  TempSrc:    Axillary  SpO2: 100% 100% 100% 100%  Weight:      Height:       General exam: Awake and alert, no acute distress HEENT: moist mucus membranes, clear conjunctiva Respiratory system: CTAB with diminished bases, no wheezes, on room air, normal respiratory effort. Cardiovascular system: normal S1/S2, RRR,o pedal edema.   Gastrointestinal system: Abdomen soft nontender nondistended Central nervous system: Exam limited due to dementia but grossly nonfocal exam Extremities: no edema, normal tone Skin: dry, intact, normal  temperature Psychiatry: Normal mood and affect, abnormal judgment and insight due to dementia   Data Reviewed:  Notable labs ---  K 3.1, Mg 1.6,   Family Communication: daughter at bedside on rounds this morning  Disposition: Status is: Inpatient Remains inpatient appropriate because: electrolyte derangements requiring replacement and close monitoring.   Planned Discharge Destination: Home with HH     Time spent: 40 minutes  Author: , DO 12/14/2021 5:55 PM  For on call review [email or web address].

## 2021-12-15 DIAGNOSIS — E785 Hyperlipidemia, unspecified: Secondary | ICD-10-CM | POA: Diagnosis not present

## 2021-12-15 DIAGNOSIS — K5289 Other specified noninfective gastroenteritis and colitis: Secondary | ICD-10-CM | POA: Diagnosis not present

## 2021-12-15 DIAGNOSIS — R338 Other retention of urine: Secondary | ICD-10-CM | POA: Diagnosis not present

## 2021-12-15 DIAGNOSIS — G9341 Metabolic encephalopathy: Secondary | ICD-10-CM | POA: Diagnosis not present

## 2021-12-15 DIAGNOSIS — L89322 Pressure ulcer of left buttock, stage 2: Secondary | ICD-10-CM

## 2021-12-15 LAB — BASIC METABOLIC PANEL
Anion gap: 3 — ABNORMAL LOW (ref 5–15)
BUN: 14 mg/dL (ref 8–23)
CO2: 26 mmol/L (ref 22–32)
Calcium: 8.7 mg/dL — ABNORMAL LOW (ref 8.9–10.3)
Chloride: 109 mmol/L (ref 98–111)
Creatinine, Ser: 0.61 mg/dL (ref 0.44–1.00)
GFR, Estimated: >60 mL/min (ref >60)
Glucose, Bld: 82 mg/dL (ref 70–99)
Potassium: 4.4 mmol/L (ref 3.5–5.1)
Sodium: 138 mmol/L (ref 135–145)

## 2021-12-15 LAB — CBC
HCT: 32.9 % — ABNORMAL LOW (ref 36.0–46.0)
Hemoglobin: 10.2 g/dL — ABNORMAL LOW (ref 12.0–15.0)
MCH: 20.1 pg — ABNORMAL LOW (ref 26.0–34.0)
MCHC: 31 g/dL (ref 30.0–36.0)
MCV: 64.8 fL — ABNORMAL LOW (ref 80.0–100.0)
Platelets: 218 10*3/uL (ref 150–400)
RBC: 5.08 MIL/uL (ref 3.87–5.11)
RDW: 15.5 % (ref 11.5–15.5)
WBC: 6.3 10*3/uL (ref 4.0–10.5)
nRBC: 0 % (ref 0.0–0.2)

## 2021-12-15 LAB — MAGNESIUM: Magnesium: 2.4 mg/dL (ref 1.7–2.4)

## 2021-12-15 LAB — GLUCOSE, CAPILLARY
Glucose-Capillary: 64 mg/dL — ABNORMAL LOW (ref 70–99)
Glucose-Capillary: 228 mg/dL — ABNORMAL HIGH (ref 70–99)

## 2021-12-15 MED ORDER — INSULIN GLARGINE-YFGN 100 UNIT/ML ~~LOC~~ SOLN: 8.0000 [IU] | Freq: Every day | SUBCUTANEOUS | Status: DC

## 2021-12-15 MED ORDER — POLYETHYLENE GLYCOL 3350 17 G PO PACK: 17.0000 g | PACK | Freq: Every day | ORAL | 0 refills | Status: DC

## 2021-12-15 NOTE — TOC Transition Note (Signed)
Transition of Care [facility]) - CM/SW Discharge Note   Patient Details  Name: Natalie Adams MRN: [id number] Date of Birth: 1929-07-09  Transition of Care [facility]) CM/SW Contact:  Gildardo Griffes, LCSW Phone Number: 12/15/2021, 11:48 AM   Clinical Narrative:      Patient to dc home today, HH PT and OT orders are in. Per [facility] agencies patient needs to be established with a PCP in order to get [facility]. Daughter aware and reports once established with PCP she will request for [facility] set up. Daughter reports they are in process of getting established and she will follow up.   No DME needs. EMS forms done for transport home, EMS will be called for transport home.    Final next level of care: Home w Home Health Services Barriers to Discharge: No Barriers Identified   Patient Goals and CMS Choice Patient states their goals for this hospitalization and ongoing recovery are:: to go home CMS Medicare.gov Compare Post Acute Care list provided to:: Patient Represenative (must comment) (daughter)    Discharge Placement                    Patient and family notified of of transfer: 12/15/21  Discharge Plan and Services                                     Social Determinants of Health (SDOH) Interventions     Readmission Risk Interventions     No data to display

## 2021-12-15 NOTE — Inpatient Diabetes Management (Signed)
Inpatient Diabetes Program Recommendations  AACE/ADA: New Consensus Statement on Inpatient Glycemic Control (2015)  Target Ranges:  Prepandial:   less than 140 mg/dL      Peak postprandial:   less than 180 mg/dL (1-2 hours)      Critically ill patients:  140 - 180 mg/dL   Lab Results  Component Value Date   GLUCAP 64 (L) 12/15/2021   HGBA1C 7.0 (H) 12/12/2021    Latest Reference Range & Units 12/14/21 09:31 12/14/21 09:39 12/14/21 13:22 12/14/21 16:03 12/14/21 19:53 12/15/21 08:18  Glucose-Capillary 70 - 99 mg/dL 99 91 329 (H) 191 (H) 660 (H) 64 (L)  (H): Data is abnormally high (L): Data is abnormally low  Diabetes history: DM2 Outpatient Diabetes medications: NPH 14 QD + Novolog 1:50 >150 + Metformin 1000 BID  Current orders for Inpatient glycemic control: Semglee 10 units qd, Novolog 0-9 units, 0-5 units hs correction  Inpatient Diabetes Program Recommendations:   Fasting CBG 64  Please consider: -Decrease Semglee to 8 units qd  Thank you, Billy Fischer. Sussan Meter, RN, MSN, CDE  Diabetes Coordinator Inpatient Glycemic Control Team Team Pager [phone] (8am-5pm) 12/15/2021 10:19 AM

## 2021-12-15 NOTE — Discharge Summary (Signed)
Physician Discharge Summary   Patient: Natalie Adams MRN: [id number] DOB: 05-Mar-1930  Admit date:     12/11/2021  Discharge date: 12/15/21  Discharge Physician: Alford Highland   PCP: Pcp, No   Recommendations at discharge:   Follow-up at the [facility] clinic Palliative care to follow as outpatient Home health also set up.  Discharge Diagnoses: Principal Problem:   Acute metabolic encephalopathy Active Problems:   Stercoral colitis   Diarrhea   Acute urinary retention   Dyslipidemia   Dysphagia   Essential hypertension   Type 2 diabetes mellitus without complication, with long-term current use of insulin (HCC)   Pressure injury of skin   Hypokalemia   Hypomagnesemia    Hospital Course: HPI on admission: "88 y.o. African-American female with medical history significant for asthma, osteoarthritis, type 2 diabetes mellitus and hypertension, who presented to the emergency room with ...altered mental status with confusion.  The patient has not been having much appetite with associated decreased urine output.  She has been having loose frequent bowel movements without melena or bright red meat per rectum.... "    In the ED, patient was tachypneic with BP mildly elevated 146/76 with otherwise normal vitals.  Labs were notable for normal lactic acid, elevated BNP 325.1, high sensitive troponin 19 with non-acute EKG.  CBC with very mild anemia hemoglobin 11.6 and mild leukocytosis WBC 10.8.  CT of abdomen pelvis showed stool impacted rectum with mild stercoral colitis, mild hydronephrosis and perinephric stranding present on prior scans and felt to be chronic. Noncontrast head CT showed generalized atrophy and ischemic small vessel disease but no acute findings.  Chest x-ray showed mild cardiomegaly with mild interstitial prominence most likely edema with atypical infection not excluded.  Patient mid to the hospital and started on empiric antibiotics and for further evaluation of acute  encephalopathy.    8/13: mental status at baseline, per daughter.   Monitor off antibiotics and fluids.  Patient discharged home on 12/15/2021.    Assessment and Plan: * Acute metabolic encephalopathy POA, resolved.  Suspected due to stool impaction and urinary retention.  Started on empiric antibiotics on admission.   No evidence of infection. Antibiotics have been stopped and pt continues to do well, mental status at baseline. Patient has baseline dementia but able to feed self and make needs known, per daughter --Manage underlying conditions as outlined including bowel regimen and monitoring for urinary retention --Neuro checks have been unremarkable --Delirium precautions   Diarrhea Pt reportedly passing loose watery stools at home, but CT scan showed stool impaction of the rectum. Diarrhea likely passing around impaction and should improve with relief of the impaction.  No abdominal pain or nausea/vomiting.  No fever or leukocytosis.   Acute gastritis - ruled out. --Aggressive bowel regimen --Stop antibiotics and monitor clinically --Monitor closely  Stercoral colitis Likely secondary to severe constipation. Loose stools reported but stool impaction of rectum seen on CT scan. Liquid stool passing around impacted stool. --Aggressive bowel regimen including suppositories and enemas --Treated with IV fluids and empiric IV antibiotics initially. --Stable off antibiotics, monitor   Dysphagia Improved with resolving altered mental status. --SLP following --Started on dysphagia 2 diet (fine chop), thin liquids, meds crushed in puree/apple sauce --Stop maintenance IV fluids  --Monitor PO intake --Aspiration precautions  Dyslipidemia --Continue statin therapy.  Acute urinary retention Secondary to constipation.  Patient urinating well.  Essential hypertension --Continue home antihypertensives  Type 2 diabetes mellitus without complication, with long-term current use of  insulin (HCC)  Hbg A1c 7.0%, poorly controlled. --Sliding scale NovoLog -- Continue her home insulin coverage. - Hold metformin.  Pressure injury of skin POA. I agree with the wound description as outlined below.  Continue diligent wound care, frequent repositioning and monitor closely for signs of infection.  Pressure Injury 12/12/21 Buttocks Left Stage 2 -  Partial thickness loss of dermis presenting as a shallow open injury with a red, pink wound bed without slough. (Active)  12/12/21 1812  Location: Buttocks  Location Orientation: Left  Staging: Stage 2 -  Partial thickness loss of dermis presenting as a shallow open injury with a red, pink wound bed without slough.  Wound Description (Comments):   Present on Admission: Yes     Hypomagnesemia Replaced into the normal range   Hypokalemia Replaced in the normal range  Acute gastroenteritis-resolved as of 12/13/2021          Consultants: None Procedures performed: None Disposition: Home health Diet recommendation:  Dysphagia 2 diet with thin liquids. DISCHARGE MEDICATION: Allergies as of 12/15/2021       Reactions   Amoxicillin-pot Clavulanate Diarrhea   Hydrochlorothiazide Other (See Comments)   "she got shaky and fell" "she got shaky and fell"        Medication List     STOP taking these medications    metFORMIN 1000 MG tablet Commonly known as: GLUCOPHAGE       TAKE these medications    acetaminophen 500 MG tablet Commonly known as: TYLENOL Take 2 tablets (1,000 mg total) by mouth 2 (two) times daily as needed for mild pain or moderate pain.   Algesis Tabs Take by mouth.   amLODipine 10 MG tablet Commonly known as: NORVASC Take 5 mg by mouth daily.   ascorbic acid 1000 MG tablet Commonly known as: VITAMIN C Take by mouth.   Fish Oil 1200 MG Caps Take by mouth.   insulin lispro 100 UNIT/ML injection Commonly known as: HUMALOG Sliding scale insulin # of units = (BS-150/50) TID AC    insulin NPH Human 100 UNIT/ML injection Commonly known as: NOVOLIN N Inject 14 Units into the skin daily before breakfast.   ipratropium-albuterol 0.5-2.5 (3) MG/3ML Soln Commonly known as: DUONEB SMARTSIG:1 Ampule(s) Via Nebulizer Every 6 Hours PRN   Iron 18 MG Tbcr Take 1 tablet (18 mg total) by mouth daily. Can take any over-the-counter iron supplement.   metoprolol tartrate 25 MG tablet Commonly known as: LOPRESSOR Take 12.5 mg by mouth 2 (two) times daily.   Multiple Vitamins tablet Take by mouth.   polyethylene glycol 17 g packet Commonly known as: MIRALAX / GLYCOLAX Take 17 g by mouth daily. Start taking on: December 16, 2021   pravastatin 40 MG tablet Commonly known as: PRAVACHOL Take 40 mg by mouth at bedtime.        Follow-up Information     [facility] Follow up in 5 day(s).   Contact information: [address] [place] [address] [phone]                Discharge Exam: Ceasar Mons Weights   12/11/21 2103  Weight: 72.1 kg   Physical Exam HENT:     Head: Normocephalic.     Mouth/Throat:     Pharynx: No oropharyngeal exudate.  Eyes:     General: Lids are normal.     Conjunctiva/sclera: Conjunctivae normal.  Cardiovascular:     Rate and Rhythm: Normal rate and regular rhythm.     Heart sounds: Normal heart  sounds, S1 normal and S2 normal.  Pulmonary:     Breath sounds: No decreased breath sounds, wheezing, rhonchi or rales.  Abdominal:     Palpations: Abdomen is soft.     Tenderness: There is no abdominal tenderness.  Musculoskeletal:     Right lower leg: Swelling present.     Left lower leg: Swelling present.  Neurological:     Mental Status: She is alert.     Comments: Answers some simple yes/no questions.      Condition at discharge: fair  The results of significant diagnostics from this hospitalization (including imaging, microbiology, ancillary and laboratory) are listed below for reference.   Imaging  Studies: CT ABDOMEN PELVIS W CONTRAST  Result Date: 12/11/2021 CLINICAL DATA:  Abdominal pain EXAM: CT ABDOMEN AND PELVIS WITH CONTRAST TECHNIQUE: Multidetector CT imaging of the abdomen and pelvis was performed using the standard protocol following bolus administration of intravenous contrast. RADIATION DOSE REDUCTION: This exam was performed according to the departmental dose-optimization program which includes automated exposure control, adjustment of the mA and/or kV according to patient size and/or use of iterative reconstruction technique. CONTRAST:  OMNIPAQUE IOHEXOL 300 MG/ML  SOLN COMPARISON:  CT abdomen and pelvis 10/09/2020 FINDINGS: Lower chest: Cardiomegaly. Aortic valve calcifications. Coronary artery and aortic atherosclerotic calcification. Respiratory motion obscures the lung bases. Evaluation of the upper abdomen is degraded by respiratory motion. Hepatobiliary: No focal hepatic abnormality. Cholelithiasis. Mild gallbladder distention. No evidence of cholecystitis. No biliary ductal dilation. Pancreas: Unremarkable. No pancreatic ductal dilatation or surrounding inflammatory changes. Spleen: Normal in size without focal abnormality. Adrenals/Urinary Tract: Adrenal glands are unremarkable. Similar hydronephrosis with perinephric stranding bilaterally. Mild bladder wall thickening, decreased from prior. No urinary calculi. Stomach/Bowel: Unremarkable stomach. Normal appendix. Large colonic stool burden in the right colon. Stool ball in the rectum with mild rectal wall thickening. Normal caliber large and small bowel. Vascular/Lymphatic: Aortic atherosclerosis. No enlarged abdominal or pelvic lymph nodes. Reproductive: Unremarkable. Other: No free intraperitoneal fluid or gas. Musculoskeletal: Demineralization.  Thoracolumbar spondylosis. IMPRESSION: 1. Question of impacted stool in the rectum with developing mild stercoral colitis. 2. Mild hydronephrosis and perinephric stranding. This is  similar to prior and may be chronic though recommend correlation with urinalysis to exclude infection. 3. Cholelithiasis without evidence of cholecystitis. 4. Aortic atherosclerosis and aortic valve calcification. Electronically Signed   By: Minerva Fester M.D.   On: 12/11/2021 23:02   DG Chest Portable 1 View  Result Date: 12/11/2021 CLINICAL DATA:  Altered mental status EXAM: PORTABLE CHEST 1 VIEW COMPARISON:  Radiographs 10/09/2020 FINDINGS: Cardiomegaly. Aortic atherosclerotic calcification. Hazy bilateral interstitial opacities suggesting mild pulmonary edema. Bibasilar atelectasis. No definite pleural effusion or pneumothorax. No acute osseous abnormality. IMPRESSION: Interstitial prominence suggesting mild interstitial edema though atypical infection is not excluded. Cardiomegaly. Electronically Signed   By: Minerva Fester M.D.   On: 12/11/2021 22:22   CT HEAD WO CONTRAST ( )  Result Date: 12/11/2021 CLINICAL DATA:  Mental status change EXAM: CT HEAD WITHOUT CONTRAST TECHNIQUE: Contiguous axial images were obtained from the base of the skull through the vertex without intravenous contrast. RADIATION DOSE REDUCTION: This exam was performed according to the departmental dose-optimization program which includes automated exposure control, adjustment of the mA and/or kV according to patient size and/or use of iterative reconstruction technique. COMPARISON:  CT head 10/09/2020 FINDINGS: Brain: No intracranial hemorrhage, mass effect, or evidence of acute infarct. No hydrocephalus. No extra-axial fluid collection. Generalized cerebral atrophy. Ill-defined hypoattenuation within the cerebral white matter is nonspecific  but consistent with chronic small vessel ischemic disease. Cystic structure in the anterior left frontal region is presumably extra-axial. Chronic right basal ganglia infarct. Vascular: No hyperdense vessel. Calcification of the intracranial internal carotid arteries. Skull: No fracture or  focal lesion. Sinuses/Orbits: No acute finding. Mucosal thickening in the right ethmoid air cells and right sphenoid sinus. The opacification in the right sphenoid sinus demonstrates high attenuation. Hyperostosis about the right sphenoid sinus. Opacification of a few bilateral mastoid air cells. Other: None. IMPRESSION: 1. No acute intracranial abnormality. 2. Generalized atrophy and small vessel white matter disease. 3. Chronic right sphenoid sinusitis. Electronically Signed   By: Minerva Fester M.D.   On: 12/11/2021 22:14      Labs: CBC: Recent Labs  Lab 12/11/21 2126 12/12/21 0430 12/13/21 0353 12/15/21 0426  WBC 10.8* 10.6* 9.3 6.3  NEUTROABS 7.4  --   --   --   HGB 11.6* 11.2* 10.4* 10.2*  HCT 38.0 36.7 33.7* 32.9*  MCV 64.8* 65.3* 64.6* 64.8*  PLT 239 236 215 218   Basic Metabolic Panel: Recent Labs  Lab 12/11/21 2126 12/12/21 0430 12/13/21 0353 12/14/21 0439 12/15/21 0426  NA 135 138 139 138 138  K 4.2 3.7 3.6 3.1* 4.4  CL 104 110 107 105 109  CO2 23 22 23 26 26   GLUCOSE 125* 179* 109* 99 82  BUN 20 19 19 18 14   CREATININE 0.94 0.85 0.99 0.71 0.61  CALCIUM 9.3 8.0* 8.8* 8.6* 8.7*  MG  --   --   --  1.6* 2.4   Liver Function Tests: Recent Labs  Lab 12/11/21 2126  AST 26  ALT 20  ALKPHOS 104  BILITOT 0.8  PROT 6.9  ALBUMIN 3.2*   CBG: Recent Labs  Lab 12/14/21 1322 12/14/21 1603 12/14/21 1953 12/15/21 0818 12/15/21 1203  GLUCAP 100* 129* 153* 64* 228*    Discharge time spent: greater than 30 minutes.  Signed: 12/17/21, MD [facility] 12/15/2021

## 2022-04-10 IMAGING — DX DG CHEST 1V PORT
1 series · 1 of 1 positions shown · non-contrast
Comparison: None.

CLINICAL DATA: Preop evaluation for upcoming orthopedic surgery,
known left

EXAM:
PORTABLE CHEST 1 VIEW

[chest ap]
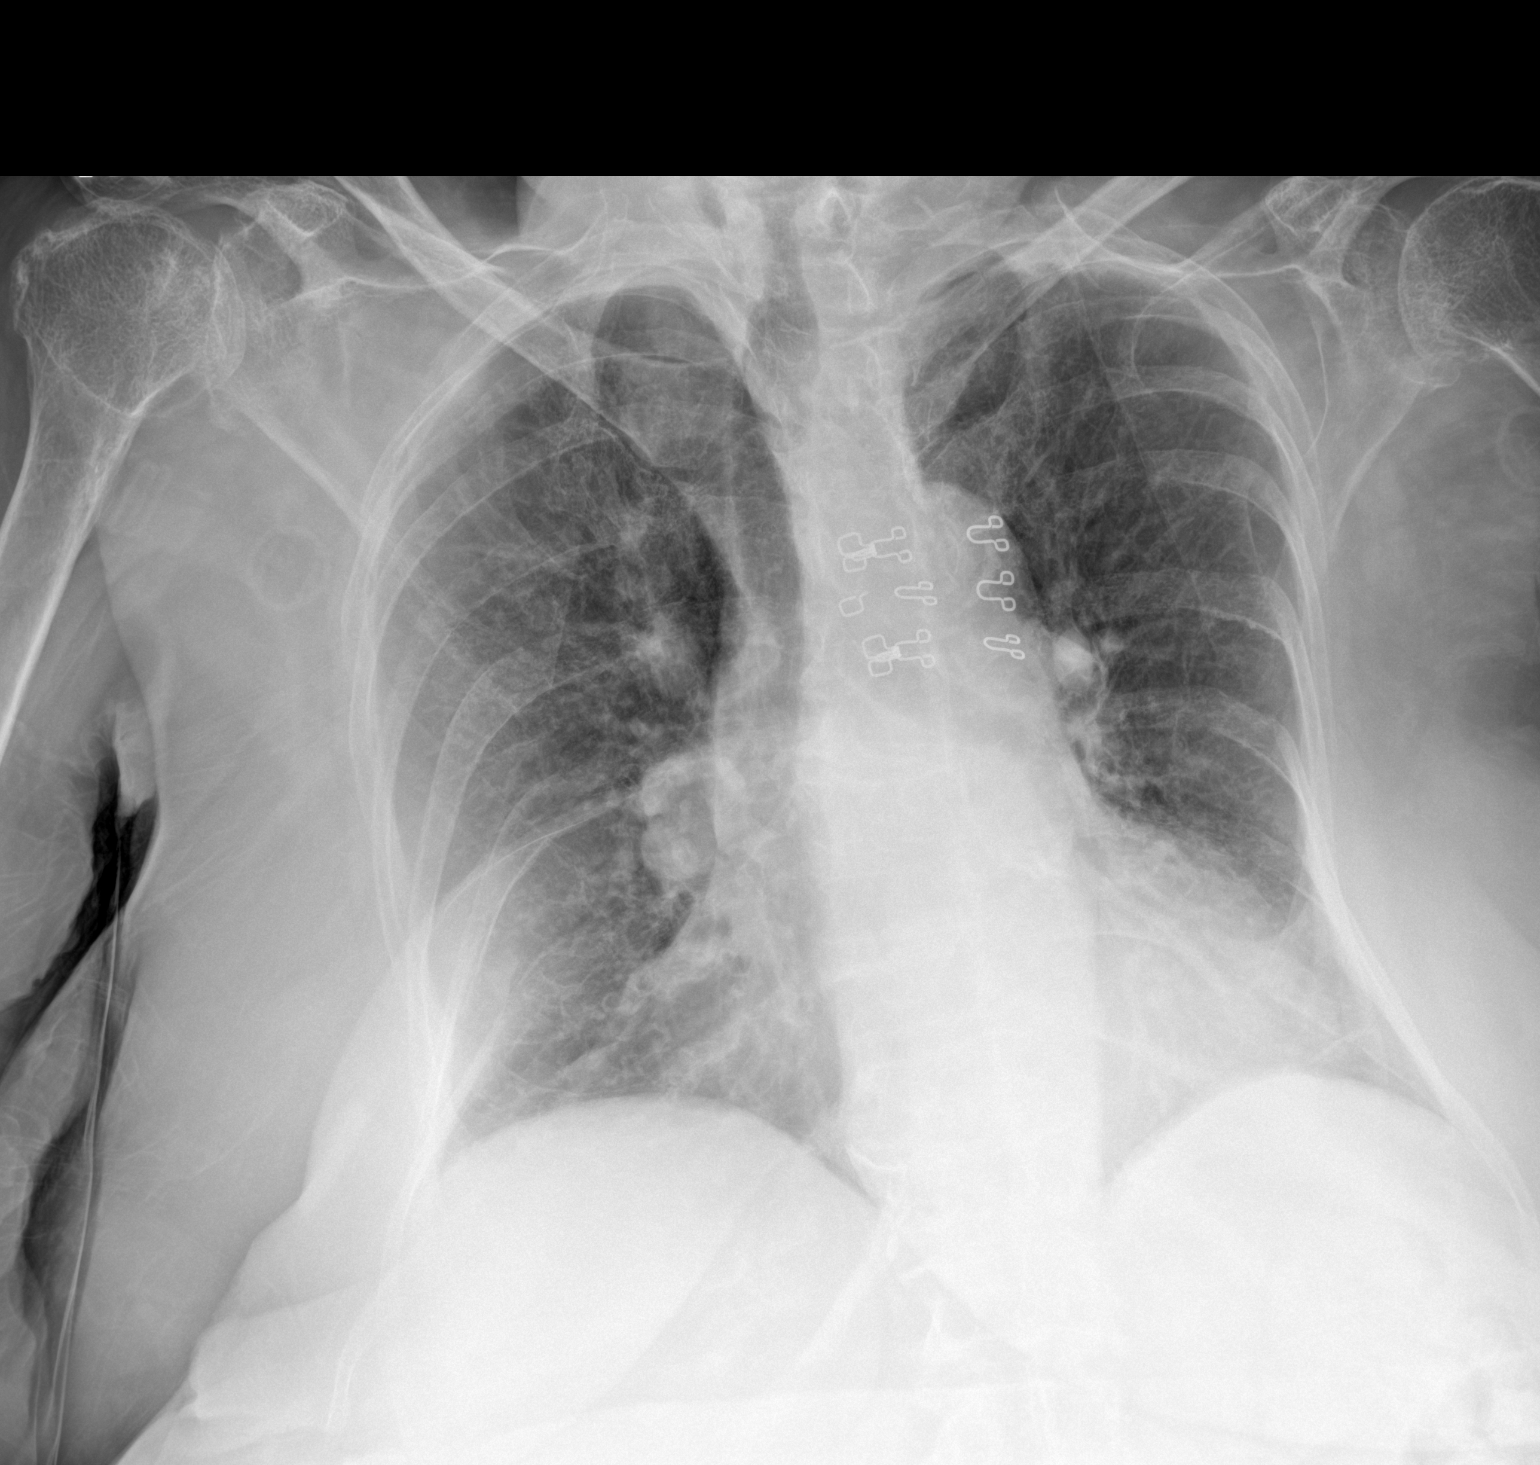

[1 of 1 positions shown; findings below may reference images not displayed]

FINDINGS: Cardiac shadow is mildly enlarged. The lungs are well aerated
bilaterally. No focal infiltrate or effusion is seen. No bony
abnormality is noted.
IMPRESSION: No acute abnormality seen.

## 2022-04-10 IMAGING — RF DG FEMUR 2+V*L*
1 series · 6 of 6 positions shown · non-contrast
Comparison: Radiographs of the left knee 06/05/2020.

CLINICAL DATA: Fracture. Additional history provided: Open
reduction internal fixation (ORIF) distal femur fracture.
Fluoroscopy time 1 minutes 4 seconds (9.1 mGy).

EXAM:
LEFT FEMUR 2 VIEWS; DG C-ARM 1-60 MIN

[Series 1: unknown protocol · 0.14mm/px · 6 of 6 slices shown]
[im 1/6]
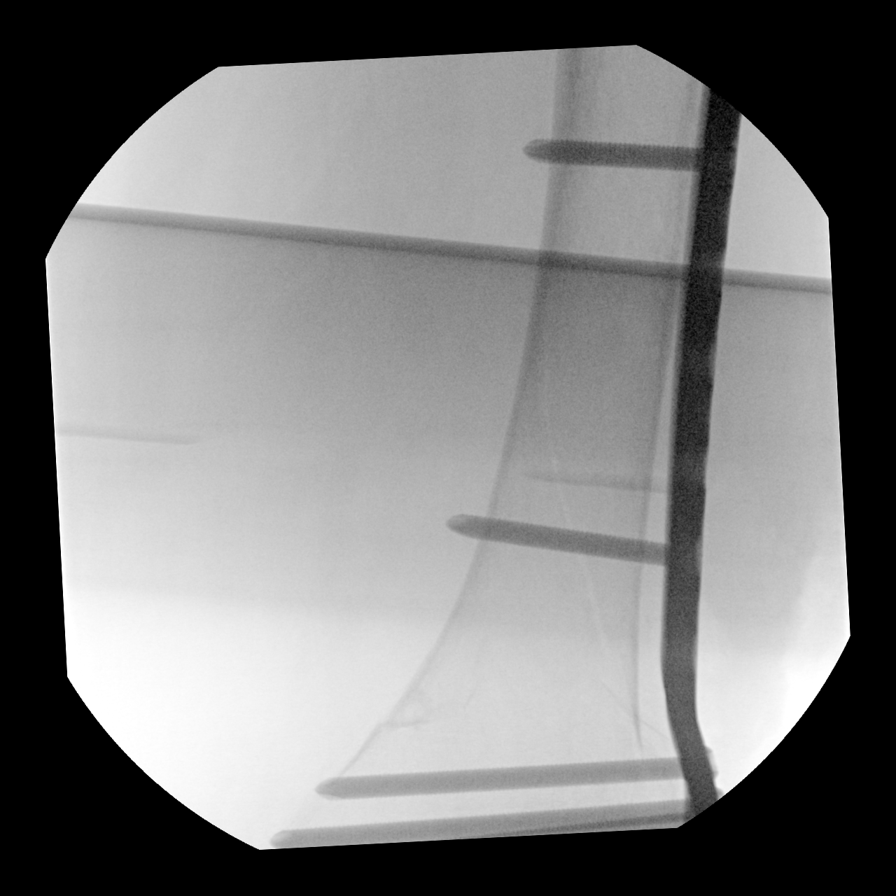
[im 2/6]
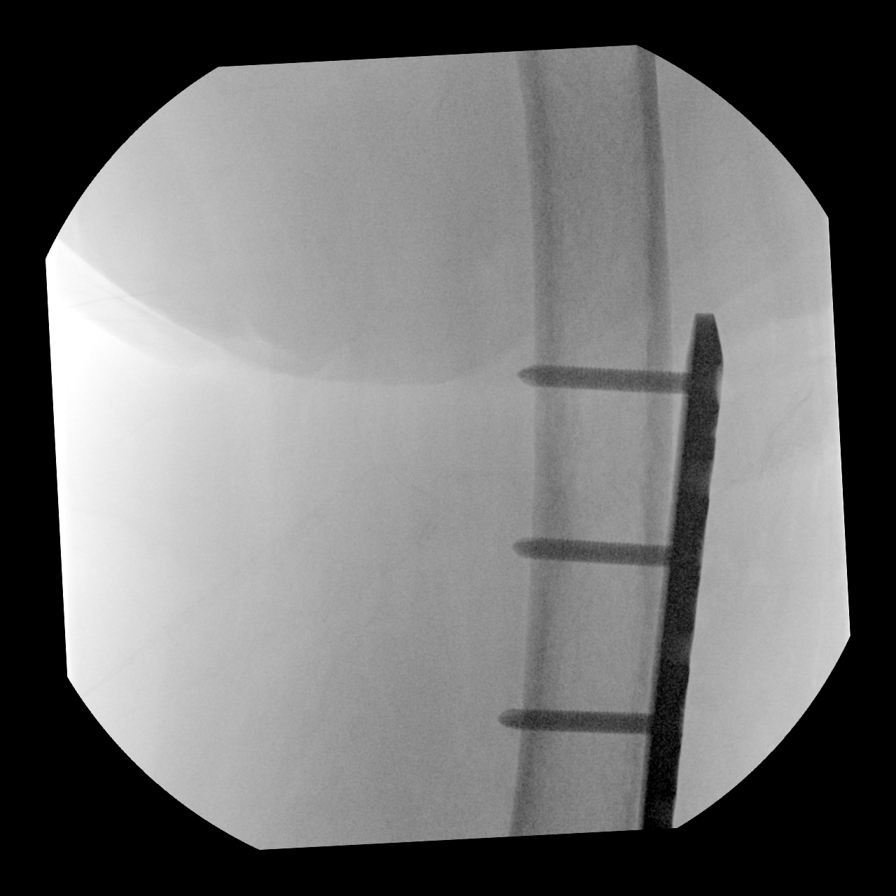
[im 3/6]
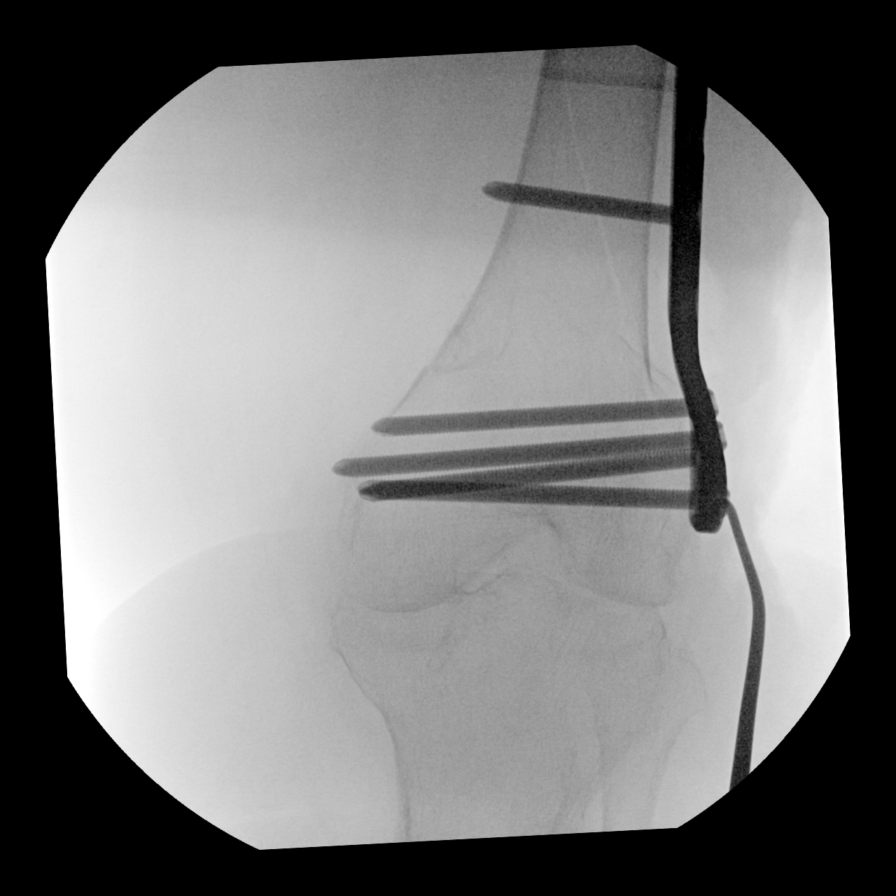
[im 4/6]
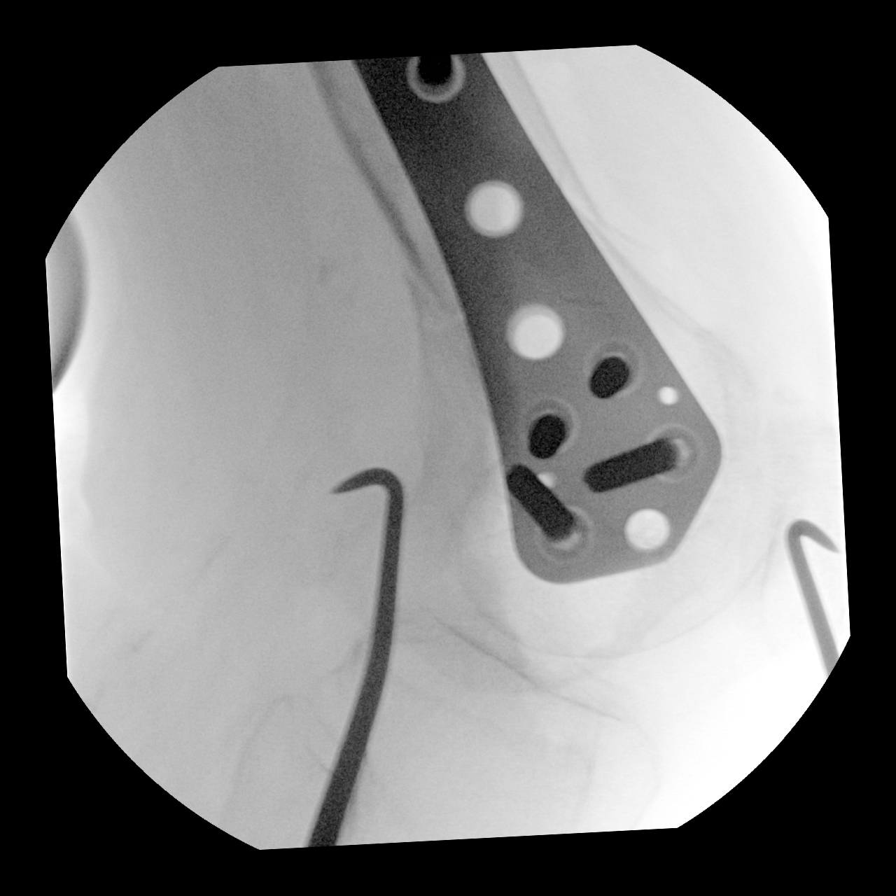
[im 5/6]
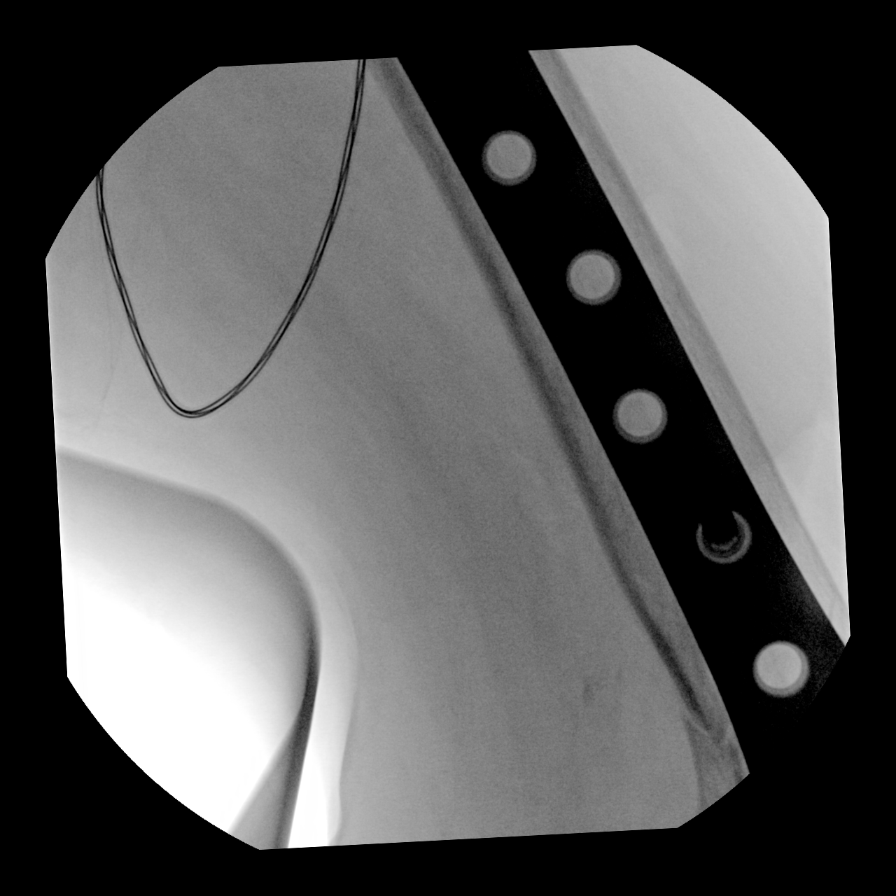
[im 6/6]
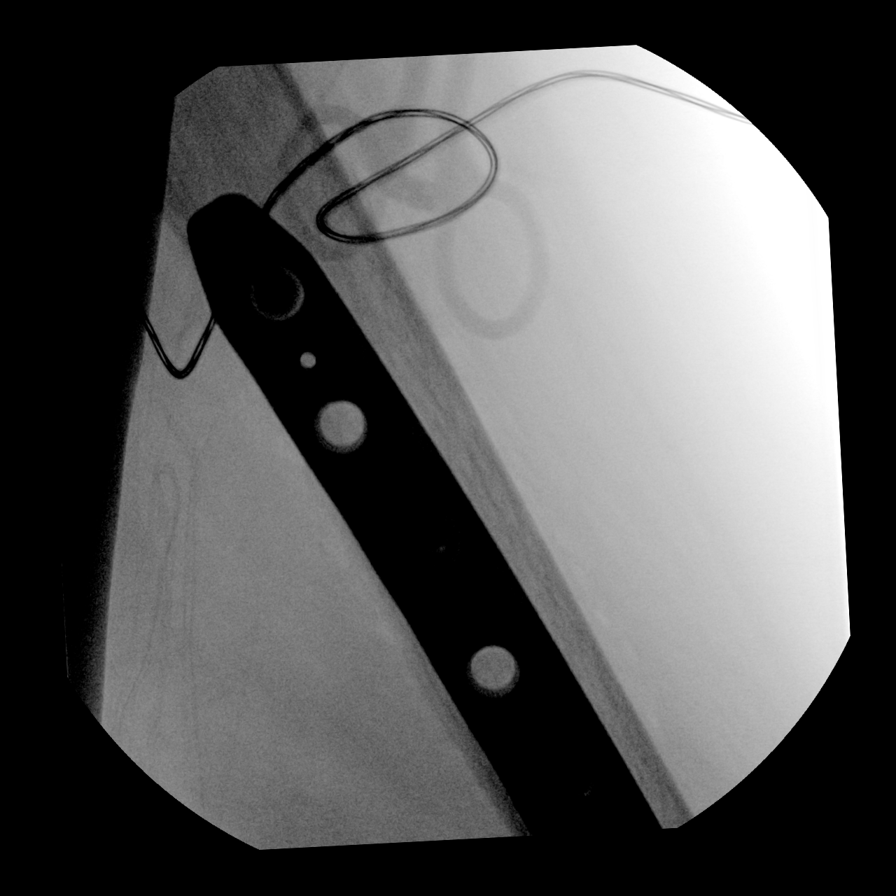

[6 of 6 positions shown; findings below may reference images not displayed]

FINDINGS: Six intraoperative fluoroscopic images of the left femur are
submitted. The images demonstrate interval lateral plate and screw
fixation of the mid to distal femur, traversing a known acute
fracture. There is near anatomic alignment.
IMPRESSION: Six intraoperative fluoroscopic images of the left femur from left
femoral ORIF, as described.

## 2022-07-09 ENCOUNTER — Emergency Department: Payer: Medicaid Other

## 2022-07-09 ENCOUNTER — Inpatient Hospital Stay
Admission: EM | Admit: 2022-07-09 | Discharge: 2022-07-16 | DRG: 175 | Disposition: A | Discharge status: Hospice | Payer: Medicaid Other | Attending: Student | Admitting: Student

## 2022-07-09 DIAGNOSIS — I130 Hypertensive heart and chronic kidney disease with heart failure and stage 1 through stage 4 chronic kidney disease, or unspecified chronic kidney disease: Secondary | ICD-10-CM | POA: Diagnosis present

## 2022-07-09 DIAGNOSIS — E1122 Type 2 diabetes mellitus with diabetic chronic kidney disease: Secondary | ICD-10-CM | POA: Diagnosis present

## 2022-07-09 DIAGNOSIS — E860 Dehydration: Secondary | ICD-10-CM | POA: Diagnosis present

## 2022-07-09 DIAGNOSIS — D649 Anemia, unspecified: Secondary | ICD-10-CM | POA: Diagnosis present

## 2022-07-09 DIAGNOSIS — I272 Pulmonary hypertension, unspecified: Secondary | ICD-10-CM | POA: Diagnosis present

## 2022-07-09 DIAGNOSIS — T45515A Adverse effect of anticoagulants, initial encounter: Secondary | ICD-10-CM | POA: Diagnosis present

## 2022-07-09 DIAGNOSIS — Z6825 Body mass index (BMI) 25.0-25.9, adult: Secondary | ICD-10-CM

## 2022-07-09 DIAGNOSIS — I10 Essential (primary) hypertension: Secondary | ICD-10-CM | POA: Diagnosis present

## 2022-07-09 DIAGNOSIS — Z66 Do not resuscitate: Secondary | ICD-10-CM | POA: Diagnosis present

## 2022-07-09 DIAGNOSIS — Z794 Long term (current) use of insulin: Secondary | ICD-10-CM

## 2022-07-09 DIAGNOSIS — E876 Hypokalemia: Secondary | ICD-10-CM | POA: Diagnosis not present

## 2022-07-09 DIAGNOSIS — Z79899 Other long term (current) drug therapy: Secondary | ICD-10-CM

## 2022-07-09 DIAGNOSIS — I161 Hypertensive emergency: Secondary | ICD-10-CM | POA: Diagnosis present

## 2022-07-09 DIAGNOSIS — J9811 Atelectasis: Secondary | ICD-10-CM | POA: Diagnosis present

## 2022-07-09 DIAGNOSIS — I214 Non-ST elevation (NSTEMI) myocardial infarction: Principal | ICD-10-CM | POA: Diagnosis present

## 2022-07-09 DIAGNOSIS — N1330 Unspecified hydronephrosis: Secondary | ICD-10-CM | POA: Diagnosis present

## 2022-07-09 DIAGNOSIS — Z7401 Bed confinement status: Secondary | ICD-10-CM

## 2022-07-09 DIAGNOSIS — I5021 Acute systolic (congestive) heart failure: Secondary | ICD-10-CM

## 2022-07-09 DIAGNOSIS — Z883 Allergy status to other anti-infective agents status: Secondary | ICD-10-CM

## 2022-07-09 DIAGNOSIS — R7989 Other specified abnormal findings of blood chemistry: Secondary | ICD-10-CM

## 2022-07-09 DIAGNOSIS — E669 Obesity, unspecified: Secondary | ICD-10-CM | POA: Diagnosis present

## 2022-07-09 DIAGNOSIS — E11649 Type 2 diabetes mellitus with hypoglycemia without coma: Secondary | ICD-10-CM | POA: Diagnosis not present

## 2022-07-09 DIAGNOSIS — E119 Type 2 diabetes mellitus without complications: Secondary | ICD-10-CM

## 2022-07-09 DIAGNOSIS — I252 Old myocardial infarction: Secondary | ICD-10-CM

## 2022-07-09 DIAGNOSIS — G9341 Metabolic encephalopathy: Secondary | ICD-10-CM | POA: Diagnosis present

## 2022-07-09 DIAGNOSIS — Z8249 Family history of ischemic heart disease and other diseases of the circulatory system: Secondary | ICD-10-CM

## 2022-07-09 DIAGNOSIS — I21A1 Myocardial infarction type 2: Secondary | ICD-10-CM | POA: Diagnosis present

## 2022-07-09 DIAGNOSIS — I083 Combined rheumatic disorders of mitral, aortic and tricuspid valves: Secondary | ICD-10-CM | POA: Diagnosis present

## 2022-07-09 DIAGNOSIS — J9601 Acute respiratory failure with hypoxia: Secondary | ICD-10-CM | POA: Diagnosis present

## 2022-07-09 DIAGNOSIS — Z515 Encounter for palliative care: Secondary | ICD-10-CM

## 2022-07-09 DIAGNOSIS — Z1152 Encounter for screening for COVID-19: Secondary | ICD-10-CM

## 2022-07-09 DIAGNOSIS — E875 Hyperkalemia: Secondary | ICD-10-CM | POA: Diagnosis present

## 2022-07-09 DIAGNOSIS — M1990 Unspecified osteoarthritis, unspecified site: Secondary | ICD-10-CM | POA: Diagnosis present

## 2022-07-09 DIAGNOSIS — I429 Cardiomyopathy, unspecified: Secondary | ICD-10-CM | POA: Diagnosis present

## 2022-07-09 DIAGNOSIS — I2693 Single subsegmental pulmonary embolism without acute cor pulmonale: Principal | ICD-10-CM | POA: Diagnosis present

## 2022-07-09 DIAGNOSIS — E785 Hyperlipidemia, unspecified: Secondary | ICD-10-CM | POA: Diagnosis present

## 2022-07-09 DIAGNOSIS — R54 Age-related physical debility: Secondary | ICD-10-CM | POA: Diagnosis present

## 2022-07-09 DIAGNOSIS — R590 Localized enlarged lymph nodes: Secondary | ICD-10-CM | POA: Diagnosis present

## 2022-07-09 DIAGNOSIS — J810 Acute pulmonary edema: Secondary | ICD-10-CM

## 2022-07-09 DIAGNOSIS — N1832 Chronic kidney disease, stage 3b: Secondary | ICD-10-CM | POA: Diagnosis present

## 2022-07-09 DIAGNOSIS — Z888 Allergy status to other drugs, medicaments and biological substances status: Secondary | ICD-10-CM

## 2022-07-09 DIAGNOSIS — J45909 Unspecified asthma, uncomplicated: Secondary | ICD-10-CM | POA: Diagnosis present

## 2022-07-09 DIAGNOSIS — Z7982 Long term (current) use of aspirin: Secondary | ICD-10-CM

## 2022-07-09 DIAGNOSIS — L89152 Pressure ulcer of sacral region, stage 2: Secondary | ICD-10-CM | POA: Diagnosis present

## 2022-07-09 DIAGNOSIS — R911 Solitary pulmonary nodule: Secondary | ICD-10-CM | POA: Diagnosis present

## 2022-07-09 DIAGNOSIS — R310 Gross hematuria: Secondary | ICD-10-CM | POA: Diagnosis not present

## 2022-07-09 DIAGNOSIS — R680 Hypothermia, not associated with low environmental temperature: Secondary | ICD-10-CM | POA: Diagnosis not present

## 2022-07-09 DIAGNOSIS — N179 Acute kidney failure, unspecified: Secondary | ICD-10-CM | POA: Diagnosis not present

## 2022-07-09 LAB — CBC WITH DIFFERENTIAL/PLATELET
Abs Immature Granulocytes: 0.03 10*3/uL (ref 0.00–0.07)
Basophils Absolute: 0 10*3/uL (ref 0.0–0.1)
Basophils Relative: 0 %
Eosinophils Absolute: 0.1 10*3/uL (ref 0.0–0.5)
Eosinophils Relative: 1 %
HCT: 36.1 % (ref 36.0–46.0)
Hemoglobin: 10.7 g/dL — ABNORMAL LOW (ref 12.0–15.0)
Immature Granulocytes: 0 %
Lymphocytes Relative: 24 %
Lymphs Abs: 1.9 10*3/uL (ref 0.7–4.0)
MCH: 20.2 pg — ABNORMAL LOW (ref 26.0–34.0)
MCHC: 29.6 g/dL — ABNORMAL LOW (ref 30.0–36.0)
MCV: 68.1 fL — ABNORMAL LOW (ref 80.0–100.0)
Monocytes Absolute: 0.6 10*3/uL (ref 0.1–1.0)
Monocytes Relative: 8 %
Neutro Abs: 5.2 10*3/uL (ref 1.7–7.7)
Neutrophils Relative %: 67 %
Platelets: 223 10*3/uL (ref 150–400)
RBC: 5.3 MIL/uL — ABNORMAL HIGH (ref 3.87–5.11)
RDW: 17.3 % — ABNORMAL HIGH (ref 11.5–15.5)
WBC: 7.8 10*3/uL (ref 4.0–10.5)
nRBC: 0 % (ref 0.0–0.2)

## 2022-07-09 LAB — COMPREHENSIVE METABOLIC PANEL
ALT: 75 U/L — ABNORMAL HIGH (ref 0–44)
AST: 113 U/L — ABNORMAL HIGH (ref 15–41)
Albumin: 3 g/dL — ABNORMAL LOW (ref 3.5–5.0)
Alkaline Phosphatase: 110 U/L (ref 38–126)
Anion gap: 8 (ref 5–15)
BUN: 23 mg/dL (ref 8–23)
CO2: 24 mmol/L (ref 22–32)
Calcium: 8.9 mg/dL (ref 8.9–10.3)
Chloride: 104 mmol/L (ref 98–111)
Creatinine, Ser: 0.9 mg/dL (ref 0.44–1.00)
GFR, Estimated: >60 mL/min (ref >60)
Glucose, Bld: 170 mg/dL — ABNORMAL HIGH (ref 70–99)
Potassium: 5.9 mmol/L — ABNORMAL HIGH (ref 3.5–5.1)
Sodium: 136 mmol/L (ref 135–145)
Total Bilirubin: 1.3 mg/dL — ABNORMAL HIGH (ref 0.3–1.2)
Total Protein: 6.8 g/dL (ref 6.5–8.1)

## 2022-07-09 LAB — RESP PANEL BY RT-PCR (RSV, FLU A&B, COVID)  RVPGX2
Influenza A by PCR: NEGATIVE
Influenza B by PCR: NEGATIVE
Resp Syncytial Virus by PCR: NEGATIVE
SARS Coronavirus 2 by RT PCR: NEGATIVE

## 2022-07-09 LAB — TROPONIN I (HIGH SENSITIVITY): Troponin I (High Sensitivity): 641 ng/L (ref <18)

## 2022-07-09 LAB — LACTIC ACID, PLASMA: Lactic Acid, Venous: 1.7 mmol/L (ref 0.5–1.9)

## 2022-07-09 LAB — BLOOD GAS, VENOUS
Acid-Base Excess: 5.7 mmol/L — ABNORMAL HIGH (ref 0.0–2.0)
Bicarbonate: 32.1 mmol/L — ABNORMAL HIGH (ref 20.0–28.0)
O2 Saturation: 75.8 %
Patient temperature: 37
pCO2, Ven: 53 mmHg (ref 44–60)
pH, Ven: 7.39 (ref 7.25–7.43)
pO2, Ven: 46 mmHg — ABNORMAL HIGH (ref 32–45)

## 2022-07-09 LAB — URINALYSIS, ROUTINE W REFLEX MICROSCOPIC
Bacteria, UA: NONE SEEN
Bilirubin Urine: NEGATIVE
Glucose, UA: NEGATIVE mg/dL
Hgb urine dipstick: NEGATIVE
Ketones, ur: NEGATIVE mg/dL
Leukocytes,Ua: NEGATIVE
Nitrite: NEGATIVE
Protein, ur: >=300 mg/dL — AB
Specific Gravity, Urine: 1.029 (ref 1.005–1.030)
pH: 5 (ref 5.0–8.0)

## 2022-07-09 MED ORDER — IOHEXOL 350 MG/ML SOLN
75.0000 mL | Freq: Once | INTRAVENOUS | Status: AC | PRN
  Administered 2022-07-09: 75 mL via INTRAVENOUS

## 2022-07-09 MED ORDER — LACTATED RINGERS IV BOLUS
1000.0000 mL | Freq: Once | INTRAVENOUS | Status: AC
  Administered 2022-07-09: 1000 mL via INTRAVENOUS

## 2022-07-09 NOTE — ED Provider Notes (Signed)
[facility] Provider Note    Event Date/Time   First MD Initiated Contact with Patient 07/09/22 2148     (approximate)   History   Shortness of Breath   HPI  Natalie Adams is a 89 y.o. female who presents to the emergency department with altered mental status.  History is provided by the patient's daughter at bedside.  States that earlier tonight she went to go to the bathroom and then became more confused and short of breath.  States that she had increased work of breathing and worsening altered mental status.  States that she had 1 prior hospitalization for altered mental status secondary to an infection.  Difficulty finding a primary care physician and states that she recently had an appointment for possible in-home hospice in order to be seen.  Stated that in the past that she was DNR but she has not discussed this with her recently.  I said in the past that she would not want to live with tubes, but daughter is uncertain of exactly what that would mean or if she would want to be intubated.  Unknown falls or trauma.      Physical Exam   Triage Vital Signs: ED Triage Vitals [07/09/22 2151]  Enc Vitals Group     BP 131/85     Pulse Rate (!) 108     Resp (!) 41     Temp (!) 96.7 F (35.9 C)     Temp Source Axillary     SpO2 96 %     Weight      Height      Head Circumference      Peak Flow      Pain Score      Pain Loc      Pain Edu?      Excl. in [place]?     Most recent vital signs: Vitals:   07/10/22 0030 07/10/22 0100  BP: (!) 182/100 (!) 212/127  Pulse: (!) 102 (!) 114  Resp: (!) 27 (!) 31  Temp:    SpO2: 94% 96%    Physical Exam Constitutional:      General: She is in acute distress.     Appearance: She is well-developed. She is ill-appearing.  HENT:     Head: Atraumatic.  Eyes:     Conjunctiva/sclera: Conjunctivae normal.     Pupils: Pupils are equal, round, and reactive to light.  Cardiovascular:     Rate and Rhythm:  Tachycardia present.  Pulmonary:     Effort: Tachypnea and respiratory distress present.     Breath sounds: Wheezing and rhonchi present.  Abdominal:     General: There is no distension.  Musculoskeletal:     Right lower leg: No edema.     Left lower leg: No edema.  Skin:    General: Skin is warm.  Neurological:     Mental Status: Mental status is at baseline. She is disoriented.     Comments: Opens eyes to voice.  Difficulty following commands.  Holding both upper extremities up but significant weakness.     IMPRESSION / MDM / ASSESSMENT AND PLAN / ED COURSE  I reviewed the triage vital signs and the nursing notes.  On chart review patient had a prior hospitalization for encephalopathy and urinary tract infection.  Not on home anticoagulation.  After discussion with the daughter stated that she had been DNR in the past but she did not have paperwork with her left at home.  Patient ill-appearing on arrival, tachycardic and tachypneic with altered mental status.  Broad differential but concern for possible sepsis.  Blood cultures obtained.  Started on 1 L of IV fluids  Worsening tachypnea, crackles on lung exam so 1 L of IV fluids was discontinued.  Felt that 30 cc/kg of IV fluids would be detrimental to the patient given concern for pulmonary edema.  Received approximately 500 cc bolus.   EKG  I, Nathaniel Man, the attending physician, personally viewed and interpreted this ECG.   Rate:  109  Rhythm: Sinus tachycardia  Axis: Normal  Intervals: Normal  ST&T Change: Nonspecific.  Signs of atrial enlargement.  Tachycardia while on cardiac telemetry.  RADIOLOGY I independently reviewed imaging, my interpretation of imaging: Chest x-ray obtained.  Unable to view chest x-ray and not crossing over in the PACS system.  Called radiology multiple times in order to attempt to view chest x-ray.  Independently reviewed CT scan of the head without signs of intracranial  hemorrhage.  Ordered CT PE study -reviewed, concern for aortic aneurysm, pulmonary edema, cardiomegaly.   LABS (all labs ordered are listed, but only abnormal results are displayed) Labs interpreted as -    Labs Reviewed  COMPREHENSIVE METABOLIC PANEL - Abnormal; Notable for the following components:      Result Value   Potassium 5.9 (*)    Glucose, Bld 170 (*)    Albumin 3.0 (*)    AST 113 (*)    ALT 75 (*)    Total Bilirubin 1.3 (*)    All other components within normal limits  CBC WITH DIFFERENTIAL/PLATELET - Abnormal; Notable for the following components:   RBC 5.30 (*)    Hemoglobin 10.7 (*)    MCV 68.1 (*)    MCH 20.2 (*)    MCHC 29.6 (*)    RDW 17.3 (*)    All other components within normal limits  URINALYSIS, ROUTINE W REFLEX MICROSCOPIC - Abnormal; Notable for the following components:   Color, Urine YELLOW (*)    APPearance HAZY (*)    Protein, ur >=300 (*)    All other components within normal limits  BLOOD GAS, VENOUS - Abnormal; Notable for the following components:   pO2, Ven 46 (*)    Bicarbonate 32.1 (*)    Acid-Base Excess 5.7 (*)    All other components within normal limits  BRAIN NATRIURETIC PEPTIDE - Abnormal; Notable for the following components:   B Natriuretic Peptide 2,688.1 (*)    All other components within normal limits  APTT - Abnormal; Notable for the following components:   aPTT 23 (*)    All other components within normal limits  TROPONIN I (HIGH SENSITIVITY) - Abnormal; Notable for the following components:   Troponin I (High Sensitivity) 641 (*)    All other components within normal limits  TROPONIN I (HIGH SENSITIVITY) - Abnormal; Notable for the following components:   Troponin I (High Sensitivity) 729 (*)    All other components within normal limits  RESP PANEL BY RT-PCR (RSV, FLU A&B, COVID)  RVPGX2  CULTURE, BLOOD (ROUTINE X 2)  CULTURE, BLOOD (ROUTINE X 2)  LACTIC ACID, PLASMA  LACTIC ACID, PLASMA  PROTIME-INR  HEPARIN  LEVEL (UNFRACTIONATED)    MDM  Patient arrived with altered mental status, tachypnea and tachycardia, initially concern for sepsis.  However after receiving some IV fluids patient had worsening tachypnea.  Discontinued fluids.  Concern for pulmonary edema given significant elevation of BNP and troponin.  Given IV Lasix  and ordered aspirin and heparin infusion.  Ordered CT a to evaluate for possible pulmonary embolism.  No history of PE in the past.  Patient complaining of chest pain at this time.  Some minimal improvement of her mental status.  After viewing CTA, no formal read, but concern could be dissection with aneurysm and pulmonary edema - discussed with incoming physician, Dr. Tamala Julian.     PROCEDURES:  Critical Care performed: yes  .Critical Care  Performed by: Nathaniel Man, MD Authorized by: Nathaniel Man, MD   Critical care provider statement:    Critical care time (minutes):  60   Critical care time was exclusive of:  Separately billable procedures and treating other patients   Critical care was necessary to treat or prevent imminent or life-threatening deterioration of the following conditions:  Cardiac failure   Critical care was time spent personally by me on the following activities:  Development of treatment plan with patient or surrogate, discussions with consultants, evaluation of patient's response to treatment, examination of patient, ordering and review of laboratory studies, ordering and review of radiographic studies, ordering and performing treatments and interventions, pulse oximetry, re-evaluation of patient's condition and review of old charts   Patient's presentation is most consistent with acute presentation with potential threat to life or bodily function.   MEDICATIONS ORDERED IN ED: Medications  heparin ADULT infusion 100 units/mL (25000 units/262m) (950 Units/hr Intravenous New Bag/Given 07/10/22 0049)  iohexol (OMNIPAQUE) 350 MG/ML injection 80 mL  (has no administration in time range)  lactated ringers bolus 1,000 mL (0 mLs Intravenous Stopped 07/10/22 0050)  iohexol (OMNIPAQUE) 350 MG/ML injection 75 mL (75 mLs Intravenous Contrast Given 07/09/22 2351)  furosemide (LASIX) injection 40 mg (40 mg Intravenous Given 07/10/22 0026)  heparin bolus via infusion 4,000 Units (4,000 Units Intravenous Bolus from Bag 07/10/22 0049)  labetalol (NORMODYNE) injection 20 mg (20 mg Intravenous Given 07/10/22 0112)  morphine (PF) 4 MG/ML injection 4 mg (4 mg Intravenous Given 07/10/22 0115)    FINAL CLINICAL IMPRESSION(S) / ED DIAGNOSES   Final diagnoses:  None     Rx / DC Orders   ED Discharge Orders     None        Note:  This document was prepared using Dragon voice recognition software and may include unintentional dictation errors.   MNathaniel Man MD 07/10/22 [phone]

## 2022-07-09 NOTE — ED Triage Notes (Signed)
Pt to ED via EMS complaining of shortness of breath after being placed on the bedside commode. Pt is altered at baseline and bedbound. Pt received a breathing treatment at home with no change. Pt is tachycardic and tachypneic, temp 96.7 axillary.

## 2022-07-10 ENCOUNTER — Emergency Department: Payer: Medicaid Other

## 2022-07-10 ENCOUNTER — Inpatient Hospital Stay: Payer: Medicaid Other

## 2022-07-10 ENCOUNTER — Inpatient Hospital Stay (HOSPITAL_COMMUNITY)
Admit: 2022-07-10 | Discharge: 2022-07-10 | Disposition: A | Discharge status: Home / Self Care | Payer: Medicaid Other | Attending: Family Medicine | Admitting: Family Medicine

## 2022-07-10 DIAGNOSIS — E785 Hyperlipidemia, unspecified: Secondary | ICD-10-CM | POA: Diagnosis not present

## 2022-07-10 DIAGNOSIS — Z7189 Other specified counseling: Secondary | ICD-10-CM | POA: Diagnosis not present

## 2022-07-10 DIAGNOSIS — E669 Obesity, unspecified: Secondary | ICD-10-CM | POA: Diagnosis present

## 2022-07-10 DIAGNOSIS — I083 Combined rheumatic disorders of mitral, aortic and tricuspid valves: Secondary | ICD-10-CM | POA: Diagnosis present

## 2022-07-10 DIAGNOSIS — E1122 Type 2 diabetes mellitus with diabetic chronic kidney disease: Secondary | ICD-10-CM | POA: Diagnosis present

## 2022-07-10 DIAGNOSIS — I2693 Single subsegmental pulmonary embolism without acute cor pulmonale: Secondary | ICD-10-CM | POA: Diagnosis present

## 2022-07-10 DIAGNOSIS — J9601 Acute respiratory failure with hypoxia: Secondary | ICD-10-CM | POA: Diagnosis present

## 2022-07-10 DIAGNOSIS — Z515 Encounter for palliative care: Secondary | ICD-10-CM | POA: Diagnosis not present

## 2022-07-10 DIAGNOSIS — I130 Hypertensive heart and chronic kidney disease with heart failure and stage 1 through stage 4 chronic kidney disease, or unspecified chronic kidney disease: Secondary | ICD-10-CM | POA: Diagnosis present

## 2022-07-10 DIAGNOSIS — Z66 Do not resuscitate: Secondary | ICD-10-CM | POA: Diagnosis present

## 2022-07-10 DIAGNOSIS — R310 Gross hematuria: Secondary | ICD-10-CM | POA: Diagnosis not present

## 2022-07-10 DIAGNOSIS — E875 Hyperkalemia: Secondary | ICD-10-CM | POA: Diagnosis not present

## 2022-07-10 DIAGNOSIS — I429 Cardiomyopathy, unspecified: Secondary | ICD-10-CM | POA: Diagnosis present

## 2022-07-10 DIAGNOSIS — E1169 Type 2 diabetes mellitus with other specified complication: Secondary | ICD-10-CM

## 2022-07-10 DIAGNOSIS — J810 Acute pulmonary edema: Secondary | ICD-10-CM

## 2022-07-10 DIAGNOSIS — I272 Pulmonary hypertension, unspecified: Secondary | ICD-10-CM | POA: Diagnosis present

## 2022-07-10 DIAGNOSIS — I161 Hypertensive emergency: Secondary | ICD-10-CM | POA: Diagnosis present

## 2022-07-10 DIAGNOSIS — N179 Acute kidney failure, unspecified: Secondary | ICD-10-CM | POA: Diagnosis not present

## 2022-07-10 DIAGNOSIS — J45909 Unspecified asthma, uncomplicated: Secondary | ICD-10-CM | POA: Diagnosis present

## 2022-07-10 DIAGNOSIS — D649 Anemia, unspecified: Secondary | ICD-10-CM | POA: Diagnosis present

## 2022-07-10 DIAGNOSIS — I5021 Acute systolic (congestive) heart failure: Secondary | ICD-10-CM | POA: Diagnosis present

## 2022-07-10 DIAGNOSIS — R7989 Other specified abnormal findings of blood chemistry: Secondary | ICD-10-CM | POA: Diagnosis not present

## 2022-07-10 DIAGNOSIS — E876 Hypokalemia: Secondary | ICD-10-CM | POA: Diagnosis not present

## 2022-07-10 DIAGNOSIS — L89152 Pressure ulcer of sacral region, stage 2: Secondary | ICD-10-CM | POA: Diagnosis present

## 2022-07-10 DIAGNOSIS — N1832 Chronic kidney disease, stage 3b: Secondary | ICD-10-CM | POA: Diagnosis present

## 2022-07-10 DIAGNOSIS — I10 Essential (primary) hypertension: Secondary | ICD-10-CM

## 2022-07-10 DIAGNOSIS — J9811 Atelectasis: Secondary | ICD-10-CM | POA: Diagnosis present

## 2022-07-10 DIAGNOSIS — E11649 Type 2 diabetes mellitus with hypoglycemia without coma: Secondary | ICD-10-CM | POA: Diagnosis not present

## 2022-07-10 DIAGNOSIS — I214 Non-ST elevation (NSTEMI) myocardial infarction: Secondary | ICD-10-CM | POA: Diagnosis present

## 2022-07-10 DIAGNOSIS — Z794 Long term (current) use of insulin: Secondary | ICD-10-CM | POA: Diagnosis not present

## 2022-07-10 DIAGNOSIS — I21A1 Myocardial infarction type 2: Secondary | ICD-10-CM | POA: Diagnosis present

## 2022-07-10 DIAGNOSIS — Z1152 Encounter for screening for COVID-19: Secondary | ICD-10-CM | POA: Diagnosis not present

## 2022-07-10 DIAGNOSIS — G9341 Metabolic encephalopathy: Secondary | ICD-10-CM | POA: Diagnosis present

## 2022-07-10 DIAGNOSIS — N1330 Unspecified hydronephrosis: Secondary | ICD-10-CM | POA: Diagnosis present

## 2022-07-10 LAB — LIPID PANEL
Cholesterol: 138 mg/dL (ref 0–200)
HDL: 73 mg/dL (ref >40)
LDL Cholesterol: 55 mg/dL (ref 0–99)
Total CHOL/HDL Ratio: 1.9 RATIO
Triglycerides: 51 mg/dL (ref <150)
VLDL: 10 mg/dL (ref 0–40)

## 2022-07-10 LAB — LACTIC ACID, PLASMA: Lactic Acid, Venous: 1.5 mmol/L (ref 0.5–1.9)

## 2022-07-10 LAB — TROPONIN I (HIGH SENSITIVITY)
Troponin I (High Sensitivity): 729 ng/L (ref <18)
Troponin I (High Sensitivity): 920 ng/L (ref <18)
Troponin I (High Sensitivity): 727 ng/L (ref <18)
Troponin I (High Sensitivity): 670 ng/L (ref <18)
Troponin I (High Sensitivity): 564 ng/L (ref <18)

## 2022-07-10 LAB — BASIC METABOLIC PANEL
Anion gap: 8 (ref 5–15)
BUN: 24 mg/dL — ABNORMAL HIGH (ref 8–23)
CO2: 26 mmol/L (ref 22–32)
Calcium: 9.1 mg/dL (ref 8.9–10.3)
Chloride: 102 mmol/L (ref 98–111)
Creatinine, Ser: 0.88 mg/dL (ref 0.44–1.00)
GFR, Estimated: >60 mL/min (ref >60)
Glucose, Bld: 230 mg/dL — ABNORMAL HIGH (ref 70–99)
Potassium: 4.8 mmol/L (ref 3.5–5.1)
Sodium: 136 mmol/L (ref 135–145)

## 2022-07-10 LAB — ECHOCARDIOGRAM COMPLETE
AR max vel: 0.37 cm2
AV Area VTI: 0.4 cm2
AV Area mean vel: 0.37 cm2
AV Mean grad: 44.5 mmHg
AV Peak grad: 72.8 mmHg
Ao pk vel: 4.27 m/s
Area-P 1/2: 5.27 cm2
Calc EF: 39.1 %
Height: 67 in
MV M vel: 6.06 m/s
MV Peak grad: 146.9 mmHg
MV VTI: 1.61 cm2
P 1/2 time: 334 msec
Radius: 0.4 cm
S' Lateral: 4.2 cm
Single Plane A2C EF: 39.3 %
Single Plane A4C EF: 39.9 %
Weight: 2560 oz

## 2022-07-10 LAB — GLUCOSE, CAPILLARY
Glucose-Capillary: 116 mg/dL — ABNORMAL HIGH (ref 70–99)
Glucose-Capillary: 158 mg/dL — ABNORMAL HIGH (ref 70–99)
Glucose-Capillary: 180 mg/dL — ABNORMAL HIGH (ref 70–99)
Glucose-Capillary: 32 mg/dL — CL (ref 70–99)
Glucose-Capillary: 45 mg/dL — ABNORMAL LOW (ref 70–99)
Glucose-Capillary: 59 mg/dL — ABNORMAL LOW (ref 70–99)

## 2022-07-10 LAB — CBC
HCT: 38.3 % (ref 36.0–46.0)
Hemoglobin: 11.3 g/dL — ABNORMAL LOW (ref 12.0–15.0)
MCH: 19.9 pg — ABNORMAL LOW (ref 26.0–34.0)
MCHC: 29.5 g/dL — ABNORMAL LOW (ref 30.0–36.0)
MCV: 67.3 fL — ABNORMAL LOW (ref 80.0–100.0)
Platelets: 179 10*3/uL (ref 150–400)
RBC: 5.69 MIL/uL — ABNORMAL HIGH (ref 3.87–5.11)
RDW: 18.1 % — ABNORMAL HIGH (ref 11.5–15.5)
WBC: 8.1 10*3/uL (ref 4.0–10.5)
nRBC: 0.2 % (ref 0.0–0.2)

## 2022-07-10 LAB — PROTIME-INR
INR: 1.1 (ref 0.8–1.2)
INR: 1.2 (ref 0.8–1.2)
Prothrombin Time: 14.2 seconds (ref 11.4–15.2)
Prothrombin Time: 14.9 seconds (ref 11.4–15.2)

## 2022-07-10 LAB — APTT: aPTT: 23 seconds — ABNORMAL LOW (ref 24–36)

## 2022-07-10 LAB — MAGNESIUM: Magnesium: 1.9 mg/dL (ref 1.7–2.4)

## 2022-07-10 LAB — MRSA NEXT GEN BY PCR, NASAL: MRSA by PCR Next Gen: NOT DETECTED

## 2022-07-10 LAB — CBG MONITORING, ED
Glucose-Capillary: 245 mg/dL — ABNORMAL HIGH (ref 70–99)
Glucose-Capillary: 198 mg/dL — ABNORMAL HIGH (ref 70–99)
Glucose-Capillary: 124 mg/dL — ABNORMAL HIGH (ref 70–99)

## 2022-07-10 LAB — HEPARIN LEVEL (UNFRACTIONATED)
Heparin Unfractionated: >1.1 IU/mL — ABNORMAL HIGH (ref 0.30–0.70)
Heparin Unfractionated: 0.89 IU/mL — ABNORMAL HIGH (ref 0.30–0.70)
Heparin Unfractionated: 1.04 IU/mL — ABNORMAL HIGH (ref 0.30–0.70)

## 2022-07-10 LAB — PHOSPHORUS: Phosphorus: 5 mg/dL — ABNORMAL HIGH (ref 2.5–4.6)

## 2022-07-10 LAB — BRAIN NATRIURETIC PEPTIDE: B Natriuretic Peptide: 2688.1 pg/mL — ABNORMAL HIGH (ref 0.0–100.0)

## 2022-07-10 LAB — D-DIMER, QUANTITATIVE: D-Dimer, Quant: 1.85 ug/mL-FEU — ABNORMAL HIGH (ref 0.00–0.50)

## 2022-07-10 MED ORDER — DEXTROSE 50 % IV SOLN
INTRAVENOUS | Status: AC
  Filled 2022-07-10: qty 50

## 2022-07-10 MED ORDER — FUROSEMIDE 10 MG/ML IJ SOLN
40.0000 mg | Freq: Two times a day (BID) | INTRAMUSCULAR | Status: DC
  Administered 2022-07-10 – 2022-07-11 (×2): 40 mg via INTRAVENOUS
  Filled 2022-07-10 (×2): qty 4

## 2022-07-10 MED ORDER — FERROUS GLUCONATE 324 (38 FE) MG PO TABS
324.0000 mg | ORAL_TABLET | Freq: Every day | ORAL | Status: DC
  Administered 2022-07-11 – 2022-07-15 (×5): 324 mg via ORAL
  Filled 2022-07-10 (×7): qty 1

## 2022-07-10 MED ORDER — ASPIRIN 81 MG PO CHEW: 324.0000 mg | CHEWABLE_TABLET | ORAL | Status: AC

## 2022-07-10 MED ORDER — INSULIN ASPART 100 UNIT/ML IJ SOLN
0.0000 [IU] | INTRAMUSCULAR | Status: DC
  Administered 2022-07-10: 2 [IU] via SUBCUTANEOUS
  Administered 2022-07-10: 3 [IU] via SUBCUTANEOUS
  Administered 2022-07-10: 1 [IU] via SUBCUTANEOUS
  Administered 2022-07-10: 2 [IU] via SUBCUTANEOUS
  Administered 2022-07-11: 1 [IU] via SUBCUTANEOUS
  Administered 2022-07-11: 5 [IU] via SUBCUTANEOUS
  Administered 2022-07-11: 3 [IU] via SUBCUTANEOUS
  Administered 2022-07-11: 2 [IU] via SUBCUTANEOUS
  Administered 2022-07-12: 3 [IU] via SUBCUTANEOUS
  Administered 2022-07-13 (×2): 2 [IU] via SUBCUTANEOUS
  Administered 2022-07-13: 3 [IU] via SUBCUTANEOUS
  Administered 2022-07-13 (×2): 2 [IU] via SUBCUTANEOUS
  Administered 2022-07-13: 1 [IU] via SUBCUTANEOUS
  Administered 2022-07-14 (×2): 2 [IU] via SUBCUTANEOUS
  Administered 2022-07-14 – 2022-07-15 (×2): 1 [IU] via SUBCUTANEOUS
  Administered 2022-07-15 (×3): 2 [IU] via SUBCUTANEOUS
  Administered 2022-07-15: 3 [IU] via SUBCUTANEOUS
  Administered 2022-07-15: 5 [IU] via SUBCUTANEOUS
  Administered 2022-07-16: 7 [IU] via SUBCUTANEOUS
  Administered 2022-07-16: 2 [IU] via SUBCUTANEOUS
  Administered 2022-07-16: 5 [IU] via SUBCUTANEOUS
  Filled 2022-07-10 (×24): qty 1

## 2022-07-10 MED ORDER — INSULIN ASPART 100 UNIT/ML IV SOLN
10.0000 [IU] | Freq: Once | INTRAVENOUS | Status: DC
  Filled 2022-07-10: qty 0.1

## 2022-07-10 MED ORDER — HEPARIN (PORCINE) 25000 UT/250ML-% IV SOLN
800.0000 [IU]/h | INTRAVENOUS | Status: DC
  Administered 2022-07-10: 800 [IU]/h via INTRAVENOUS
  Administered 2022-07-11: 650 [IU]/h via INTRAVENOUS
  Filled 2022-07-10: qty 250

## 2022-07-10 MED ORDER — CALCIUM GLUCONATE-NACL 1-0.675 GM/50ML-% IV SOLN: 1.0000 g | Freq: Once | INTRAVENOUS | Status: DC

## 2022-07-10 MED ORDER — SODIUM CHLORIDE 0.9 % IV SOLN: 250.0000 mL | INTRAVENOUS | Status: DC

## 2022-07-10 MED ORDER — NOREPINEPHRINE 4 MG/250ML-% IV SOLN: 0.0000 ug/min | INTRAVENOUS | Status: DC

## 2022-07-10 MED ORDER — IOHEXOL 350 MG/ML SOLN
80.0000 mL | Freq: Once | INTRAVENOUS | Status: AC | PRN
  Administered 2022-07-10: 80 mL via INTRAVENOUS

## 2022-07-10 MED ORDER — CRANBERRY 300 MG PO TABS: 300.0000 mg | ORAL_TABLET | Freq: Two times a day (BID) | ORAL | Status: DC

## 2022-07-10 MED ORDER — PRAVASTATIN SODIUM 20 MG PO TABS: 40.0000 mg | ORAL_TABLET | Freq: Every day | ORAL | Status: DC

## 2022-07-10 MED ORDER — ACETAMINOPHEN 325 MG PO TABS: 650.0000 mg | ORAL_TABLET | ORAL | Status: DC | PRN

## 2022-07-10 MED ORDER — METOPROLOL TARTRATE 25 MG PO TABS: 12.5000 mg | ORAL_TABLET | Freq: Two times a day (BID) | ORAL | Status: DC

## 2022-07-10 MED ORDER — ASPIRIN 81 MG PO TBEC
81.0000 mg | DELAYED_RELEASE_TABLET | Freq: Every day | ORAL | Status: DC
  Administered 2022-07-11 – 2022-07-15 (×5): 81 mg via ORAL
  Filled 2022-07-10 (×6): qty 1

## 2022-07-10 MED ORDER — FUROSEMIDE 10 MG/ML IJ SOLN
40.0000 mg | Freq: Once | INTRAMUSCULAR | Status: AC
  Administered 2022-07-10: 40 mg via INTRAVENOUS
  Filled 2022-07-10: qty 4

## 2022-07-10 MED ORDER — MAGNESIUM HYDROXIDE 400 MG/5ML PO SUSP: 30.0000 mL | Freq: Every day | ORAL | Status: DC | PRN

## 2022-07-10 MED ORDER — HEPARIN BOLUS VIA INFUSION
4000.0000 [IU] | Freq: Once | INTRAVENOUS | Status: AC
  Administered 2022-07-10: 4000 [IU] via INTRAVENOUS
  Filled 2022-07-10: qty 4000

## 2022-07-10 MED ORDER — NOREPINEPHRINE 4 MG/250ML-% IV SOLN
2.0000 ug/min | INTRAVENOUS | Status: DC
  Filled 2022-07-10: qty 250

## 2022-07-10 MED ORDER — HYDRALAZINE HCL 20 MG/ML IJ SOLN
10.0000 mg | Freq: Four times a day (QID) | INTRAMUSCULAR | Status: DC | PRN
  Administered 2022-07-14: 10 mg via INTRAVENOUS
  Filled 2022-07-10: qty 1

## 2022-07-10 MED ORDER — ALPRAZOLAM 0.25 MG PO TABS
0.2500 mg | ORAL_TABLET | Freq: Two times a day (BID) | ORAL | Status: DC | PRN
  Filled 2022-07-10: qty 1

## 2022-07-10 MED ORDER — TRAZODONE HCL 50 MG PO TABS
25.0000 mg | ORAL_TABLET | Freq: Every evening | ORAL | Status: DC | PRN
  Administered 2022-07-14: 25 mg via ORAL
  Filled 2022-07-10: qty 1

## 2022-07-10 MED ORDER — LABETALOL HCL 5 MG/ML IV SOLN
20.0000 mg | Freq: Once | INTRAVENOUS | Status: AC
  Administered 2022-07-10: 20 mg via INTRAVENOUS
  Filled 2022-07-10: qty 4

## 2022-07-10 MED ORDER — ASPIRIN 300 MG RE SUPP
300.0000 mg | RECTAL | Status: AC
  Administered 2022-07-10: 300 mg via RECTAL
  Filled 2022-07-10: qty 1

## 2022-07-10 MED ORDER — CHLORHEXIDINE GLUCONATE CLOTH 2 % EX PADS
6.0000 | MEDICATED_PAD | Freq: Every day | CUTANEOUS | Status: DC
  Administered 2022-07-11 – 2022-07-16 (×6): 6 via TOPICAL

## 2022-07-10 MED ORDER — POLYETHYLENE GLYCOL 3350 17 G PO PACK: 17.0000 g | PACK | Freq: Every day | ORAL | Status: DC | PRN

## 2022-07-10 MED ORDER — DEXTROSE 50 % IV SOLN
1.0000 | Freq: Once | INTRAVENOUS | Status: DC
  Filled 2022-07-10: qty 50

## 2022-07-10 MED ORDER — INSULIN NPH (HUMAN) (ISOPHANE) 100 UNIT/ML ~~LOC~~ SUSP: 8.0000 [IU] | Freq: Two times a day (BID) | SUBCUTANEOUS | Status: DC

## 2022-07-10 MED ORDER — AMLODIPINE BESYLATE 5 MG PO TABS: 5.0000 mg | ORAL_TABLET | Freq: Every day | ORAL | Status: DC

## 2022-07-10 MED ORDER — DEXTROSE 50 % IV SOLN
12.5000 g | INTRAVENOUS | Status: AC
  Administered 2022-07-10: 12.5 g via INTRAVENOUS

## 2022-07-10 MED ORDER — DEXTROSE 50 % IV SOLN
1.0000 | INTRAVENOUS | Status: AC
  Administered 2022-07-10: 50 mL via INTRAVENOUS
  Filled 2022-07-10: qty 50

## 2022-07-10 MED ORDER — HEPARIN (PORCINE) 25000 UT/250ML-% IV SOLN
950.0000 [IU]/h | INTRAVENOUS | Status: DC
  Administered 2022-07-10: 950 [IU]/h via INTRAVENOUS
  Filled 2022-07-10: qty 250

## 2022-07-10 MED ORDER — METOPROLOL TARTRATE 25 MG PO TABS
12.5000 mg | ORAL_TABLET | Freq: Two times a day (BID) | ORAL | Status: DC
  Administered 2022-07-11: 12.5 mg via ORAL
  Filled 2022-07-10 (×2): qty 1

## 2022-07-10 MED ORDER — INSULIN NPH (HUMAN) (ISOPHANE) 100 UNIT/ML ~~LOC~~ SUSP: 8.0000 [IU] | Freq: Every day | SUBCUTANEOUS | Status: DC

## 2022-07-10 MED ORDER — OSTEO BI-FLEX ONE PER DAY PO TABS: ORAL_TABLET | Freq: Every day | ORAL | Status: DC

## 2022-07-10 MED ORDER — INSULIN NPH (HUMAN) (ISOPHANE) 100 UNIT/ML ~~LOC~~ SUSP
14.0000 [IU] | Freq: Every day | SUBCUTANEOUS | Status: DC
  Administered 2022-07-10: 14 [IU] via SUBCUTANEOUS
  Filled 2022-07-10: qty 10

## 2022-07-10 MED ORDER — NITROGLYCERIN 0.4 MG SL SUBL: 0.4000 mg | SUBLINGUAL_TABLET | SUBLINGUAL | Status: DC | PRN

## 2022-07-10 MED ORDER — METOPROLOL TARTRATE 25 MG PO TABS: 25.0000 mg | ORAL_TABLET | Freq: Two times a day (BID) | ORAL | Status: DC

## 2022-07-10 MED ORDER — HYDRALAZINE HCL 50 MG PO TABS: 50.0000 mg | ORAL_TABLET | Freq: Four times a day (QID) | ORAL | Status: DC | PRN

## 2022-07-10 MED ORDER — VITAMIN C 500 MG PO TABS
1000.0000 mg | ORAL_TABLET | Freq: Every day | ORAL | Status: DC
  Administered 2022-07-11 – 2022-07-15 (×5): 1000 mg via ORAL
  Filled 2022-07-10 (×6): qty 2

## 2022-07-10 MED ORDER — MORPHINE SULFATE (PF) 4 MG/ML IV SOLN
4.0000 mg | Freq: Once | INTRAVENOUS | Status: AC
  Administered 2022-07-10: 4 mg via INTRAVENOUS
  Filled 2022-07-10: qty 1

## 2022-07-10 MED ORDER — ATORVASTATIN CALCIUM 80 MG PO TABS
80.0000 mg | ORAL_TABLET | Freq: Every day | ORAL | Status: DC
  Administered 2022-07-11 – 2022-07-15 (×5): 80 mg via ORAL
  Filled 2022-07-10: qty 4
  Filled 2022-07-10 (×2): qty 1
  Filled 2022-07-10 (×2): qty 4
  Filled 2022-07-10: qty 1

## 2022-07-10 MED ORDER — ONDANSETRON HCL 4 MG/2ML IJ SOLN: 4.0000 mg | Freq: Four times a day (QID) | INTRAMUSCULAR | Status: DC | PRN

## 2022-07-10 NOTE — Progress Notes (Signed)
[facility] Progress Note  Patient: Natalie Adams    [id number]  DOA: 07/09/2022     Date of Service: the patient was seen and examined on 07/10/2022  Chief Complaint  Patient presents with   Shortness of Breath   Brief hospital course: [name] is a 89 y.o. African-American female with medical history significant for type diabetes mellitus, hypertension, asthma and osteoarthritis who presented to the emergency room with acute onset of dyspnea and altered mental status with confusion after having a bowel movement today.  She was hypothermic.  She was not communicative as her usual .  She did not have any reported chest pain. She has mild lower extremity edema without worsening.  No any other symptoms. Ed w/up:  HR 108, RR 41, Tma 96.7 O2 sat 96 on RA,  VBG with pH 7.39, HCO3 32.1 and pCO2 53 and pO2 of 46.  K 5.9 hyperkalemia, SBG 170, troponin 641--729 elevated, BNP 2688 elevated lactic acid 1.7--1.5, Hb 10.7, D-dimer 1.85 elevated   Assessment and Plan:   Acute metabolic encephalopathy could be secondary to hypoxia and hypertensive emergency Continue supportive care Continue fall precautions Continued treatment as below COVID and flu negative, blood culture negative   Non-STEMI could be demand ischemia due to hypertensive urgency Patient denies any chest pain Continue aspirin 81 mg p.o. daily and statin Continue IV heparin IV infusion for 48 hours Continue nitroglycerin as needed and IV morphine as needed Follow 2D echocardiogram Cardiology consult appreciated, recommended no ischemic workup at this time due to advanced age and poor prognosis, continue heparin for 48 hours and follow TTE Troponin 729 -- 920  Trend troponin    Elevated D-dimer, suspected PE CTA: No central pulmonary embolism. The bilateral lower lobe segmental and subsegmental pulmonary arteries are not well visualized due to respiratory motion. Possible filling defects in the right lower lobe  lateral and posterior segmental arteries are favored due to motion however acute or chronic thromboembolism is difficult to exclude.  2. Cardiomegaly with evidence of pulmonary edema and small right-greater-than-left pleural effusions. 3. Diffuse bronchial wall thickening and peripheral centrilobular micro nodularity suggestive of airway inflammation/infection. 4. Dilated main pulmonary artery which can be seen with pulmonary hypertension. Lower extremity venous duplex negative for DVT Patient may need VQ scan after diuresis ?? Continue heparin IV infusion   #Acute pulmonary edema It could be secondary to non-STEMI and PE, acute cor pulmonale vs hypertensive emergency BNP elevated S/p IV Lasix for diuresis, started Lasix 40 mg IV twice daily Follow 2D echocardiogram  Hypertensive emergency, SBP 212/127 History of hypertension Patient received IV labetalol and IV Lasix -Continue Lopressor 12.5 twice daily with holding parameters Blood pressure dropped after IV labetalol, discontinued amlodipine for now Use hydralazine p.o. versus IV as needed, follow parameters We will continue monitor BP and titrate medications accordingly   Hyperkalemia - s/p D50, insulin and IV calcium gluconate. - Potassium 4.8, hyperkalemia resolved   Type 2 diabetes mellitus without complication, with long-term current use of insulin (HCC) - Developed hypoglycemia on 3/10 Follow hypoglycemia protocol Discontinued scheduled Novolin Continue NovoLog sliding scale Monitor CBG Continue diabetic diet   # Pulmonary nodule and mediastinal lymphadenopathy, incidental finding CTA: Mediastinal lymphadenopathy measuring up to 1.2 cm in short axis is favored to be reactive. Right upper lobe pulmonary nodule measuring 8 mm. Non-contrast chest CT at 6-12 months is recommended. If the nodule is stable at time of repeat CT, then future CT at 18-24 months Recommended to follow with PCP.  Body mass index is 25.06  kg/m.  Interventions:   Pressure Injury 12/12/21 Buttocks Left Stage 2 -  Partial thickness loss of dermis presenting as a shallow open injury with a red, pink wound bed without slough. (Active)  12/12/21 1812  Location: Buttocks  Location Orientation: Left  Staging: Stage 2 -  Partial thickness loss of dermis presenting as a shallow open injury with a red, pink wound bed without slough.  Wound Description (Comments):   Present on Admission: Yes     Diet: Diabetic diet DVT Prophylaxis: Therapeutic Anticoagulation with heparin IV infusion    Advance goals of care discussion: DNR  Family Communication: family was not present at bedside, at the time of interview.  The pt provided permission to discuss medical plan with the family. Opportunity was given to ask question and all questions were answered satisfactorily.   Disposition:  Pt is [place], admitted with AMS, elevated troponin and suspected PE, pulmonary edema, respiratory failure, still has AMS, on heparin infusion, which precludes a safe discharge. Discharge to TBD pending PT/OT eval, when clinically stable, may need few days to improve.  Subjective: No significant events overnight, patient was lethargic and sleepy, unable to communicate and offer any complaints.  Patient was seen in the ED.  Patient was transferred to floor and RN noticed low blood sugar level, and low temperature, we will continue to monitor closely.  Overall patient's prognosis is very poor, very high risk to deteriorate.  Patient is DNR/DNI.  We will consult palliative care for goals of care discussion.  Physical Exam: General: NAD, lying comfortably Appear in no distress, affect appropriate Eyes: PERRLA ENT: Oral Mucosa Clear, moist  Neck: no JVD,  Cardiovascular: S1 and S2 Present, no Murmur,  Respiratory: good respiratory effort, Bilateral Air entry equal and Decreased, no Crackles, no wheezes Abdomen: Bowel Sound present, Soft and no tenderness,   Skin: no rashes Extremities: no Pedal edema, no calf tenderness Neurologic: without any new focal findings Gait not checked due to patient safety concerns  Vitals:   07/10/22 0330 07/10/22 0547 07/10/22 0700 07/10/22 0800  BP: (!) 168/96 (!) 152/103 (!) 148/86 115/71  Pulse: 100 (!) 105 99 86  Resp: 14 (!) 38 (!) 22 (!) 25  Temp:  98.1 F (36.7 C)    TempSrc:  Rectal    SpO2: 100% 100% 100% 99%  Weight:      Height:        Intake/Output Summary (Last 24 hours) at 07/10/2022 0850 Last data filed at 07/10/2022 0050 Gross per 24 hour  Intake 1000 ml  Output --  Net 1000 ml   Filed Weights   07/10/22 0022  Weight: 72.6 kg    Data Reviewed: I have personally reviewed and interpreted daily labs, tele strips, imagings as discussed above. I reviewed all nursing notes, pharmacy notes, vitals, pertinent old records I have discussed plan of care as described above with RN and patient/family.  CBC: Recent Labs  Lab 07/09/22 2208 07/10/22 0520  WBC 7.8 8.1  NEUTROABS 5.2  --   HGB 10.7* 11.3*  HCT 36.1 38.3  MCV 68.1* 67.3*  PLT 223 0000000   Basic Metabolic Panel: Recent Labs  Lab 07/09/22 2208 07/10/22 0520  NA 136 136  K 5.9* 4.8  CL 104 102  CO2 24 26  GLUCOSE 170* 230*  BUN 23 24*  CREATININE 0.90 0.88  CALCIUM 8.9 9.1    Studies: US Venous Img Lower Bilateral (DVT)  Result Date: 07/10/2022  CLINICAL DATA:  [id number] Acute pulmonary embolism ([place]) [id number] EXAM: BILATERAL LOWER EXTREMITY VENOUS DOPPLER ULTRASOUND TECHNIQUE: Gray-scale sonography with graded compression, as well as color Doppler and duplex ultrasound were performed to evaluate the lower extremity deep venous systems from the level of the common femoral vein and including the common femoral, femoral, profunda femoral, popliteal and calf veins including the posterior tibial, peroneal and gastrocnemius veins when visible. The superficial great saphenous vein was also interrogated. Spectral Doppler was  utilized to evaluate flow at rest and with distal augmentation maneuvers in the common femoral, femoral and popliteal veins. COMPARISON:  CT CAP earlier same day. FINDINGS: RIGHT LOWER EXTREMITY VENOUS Normal compressibility of the RIGHT common femoral, superficial femoral, and popliteal veins, as well as the visualized calf veins. Visualized portions of profunda femoral vein and great saphenous vein unremarkable. No filling defects to suggest DVT on grayscale or color Doppler imaging. Doppler waveforms show normal direction of venous flow, normal respiratory plasticity and response to augmentation. OTHER No evidence of superficial thrombophlebitis or abnormal fluid collection. Limitations: Patient body habitus LEFT LOWER EXTREMITY VENOUS Normal compressibility of the LEFT common femoral, superficial femoral, and popliteal veins, as well as the visualized calf veins. Visualized portions of profunda femoral vein and great saphenous vein unremarkable. No filling defects to suggest DVT on grayscale or color Doppler imaging. Doppler waveforms show normal direction of venous flow, normal respiratory plasticity and response to augmentation. OTHER No evidence of superficial thrombophlebitis or abnormal fluid collection. Limitations: Patient body habitus IMPRESSION: No evidence of femoropopliteal DVT or superficial thrombophlebitis within either lower extremity. Michaelle Birks, MD [facility] Electronically Signed   By: Michaelle Birks M.D.   On: 07/10/2022 06:21   CT Angio Chest/Abd/Pel for Dissection W and/or Wo Contrast  Result Date: 07/10/2022 CLINICAL DATA:  Tachycardia, hypertension and elevated troponins. Shortness of breath also. EXAM: CT ANGIOGRAPHY CHEST, ABDOMEN AND PELVIS TECHNIQUE: Non-contrast CT of the chest was initially obtained. Multidetector CT imaging through the chest, abdomen and pelvis was performed using the standard protocol during bolus  administration of intravenous contrast. Multiplanar reconstructed images and MIPs were obtained and reviewed to evaluate the vascular anatomy. RADIATION DOSE REDUCTION: This exam was performed according to the departmental dose-optimization program which includes automated exposure control, adjustment of the mA and/or kV according to patient size and/or use of iterative reconstruction technique. CONTRAST:  56m OMNIPAQUE IOHEXOL 350 MG/ML SOLN COMPARISON:  CTA chest yesterday at 11:57 p.m., CT abdomen and pelvis with IV contrast dated 12/11/2021. FINDINGS: CTA CHEST FINDINGS Cardiovascular: Again noted are nonoccluding age-indeterminate subsegmental right lower lobe arterial emboli with involvement of 2 vessels on 6: 88 and 89 and a single small-vessel on 6:94 and 95, also seen on coronal reconstruction series 9, image 83. No large vessel or further arterial embolus is seen. The pulmonary trunk again measuring 3.5 cm indicating arterial hypertension with IVC and hepatic vein contrast reflux consistent with elevated right heart pressures. There is no increased RV/LV ratio. Moderate panchamber cardiomegaly continues to be seen with three-vessel coronary artery calcifications and no pericardial effusion. There are mildly prominent superior pulmonary veins. There is moderate patchy atherosclerosis in the aorta, heavy calcification along the valve leaflets, moderate calcification left subclavian artery. There is no aneurysm, stenosis or dissection. Mediastinum/Nodes: There are mildly enlarged multifocal mediastinal lymph nodes and a few mildly prominent hilar nodes but none greater than 1.2 cm in short axis Lungs/Pleura: Respiratory motion limits evaluation of the lungs. There is mild  interstitial edema, moderate right and small layering left pleural effusions and again noted and diffuse bronchial thickening. 8 mm right upper lobe nodule is again noted on 7:53. There is posterior atelectasis. Scattered linear scar-like  opacities. No focal pneumonia is evident. No pneumothorax. Musculoskeletal: Multiple mild mid to lower thoracic spine compression fracture deformities. Osteopenia and degenerative change. The ribcage is intact. Review of the MIP images confirms the above findings. CTA ABDOMEN AND PELVIS FINDINGS VASCULAR Aorta: Moderately calcified without stenosis, aneurysm or dissection mildly tortuous. Celiac: There are ostial wall calcifications but no stenosis or branch occlusion. SMA: Normal. Renals: Both are single. The left renal artery demonstrates a 75% calcific origin stenosis and is otherwise clear. On the right there are ostial calcifications but no stenosis. IMA: Normal. Inflow: There are mild-to-moderate patchy calcifications without flow-limiting stenoses Veins: Patent suprarenal IVC. The other veins are unopacified and not evaluated. Review of the MIP images confirms the above findings. NON-VASCULAR Hepatobiliary: There is a 1.7 cm densely calcified stone in the gallbladder. No wall thickening or biliary dilatation. No focal abnormality of the liver. Pancreas: No abnormality. Spleen: No abnormality. Adrenals/Urinary Tract: No adrenal or renal mass. There is increased bilateral mild hydroureteronephrosis without a visible ureteral filling defect. There is contrast in the collecting systems which would obscure stones if present. The bladder is increased in distention up to the level of L5. There is thickening and trabeculation of the bladder and numerous small bladder diverticula. No masslike wall thickening. Stomach/Bowel: No dilatation or wall thickening including the appendix. Moderate stool retention ascending colon. Uncomplicated scattered diverticulosis. Lymphatic: No adenopathy is seen. Reproductive: Unremarkable uterus and left ovary. There is homogeneous thin-walled right ovarian cyst measuring 3.2 cm, with Hounsfield density of 16. Other: Trace deep pelvic ascites. No free hemorrhage, free air or  incarcerated hernias. Small umbilical fat hernia. Musculoskeletal: Osteopenia and degenerative change lumbar spine. Mild-to-moderate hip DJD. No acute or other significant osseous findings. Review of the MIP images confirms the above findings. IMPRESSION: 1. Again noted age-indeterminate subsegmental right lower lobe arterial emboli. No large-vessel or further arterial embolus is seen. Prominent pulmonary trunk as before. 2. Cardiomegaly with mild interstitial edema, moderate right and small left pleural effusions and diffuse bronchial thickening. 3. Aortic and coronary artery atherosclerosis. 4. Aortic valve leaflet calcifications which may be associated with stenosis or insufficiency. 5. Contrast reflux into the IVC and hepatic veins consistent with elevated right heart pressures. No increased RV/LV ratio. 6. 75% calcific origin stenosis of the left renal artery. 7. 3.2 cm thin-walled right ovarian cyst. Recommend follow-up pelvic ultrasound in 6-12 months. Reference: JACR 2020 Feb;17(2):248-254 8. Increased bilateral hydroureteronephrosis without visible ureteral filling defect. 9. Thickened trabeculated bladder with numerous small diverticula. 10. Cholelithiasis. 11. Trace deep pelvic ascites. 12. Osteopenia and degenerative change with multiple mild mid to lower thoracic spine compression fracture deformities. 13. 8 mm right upper lobe nodule. Non-contrast chest CT at 6-12 months is recommended. If the nodule is stable at time of repeat CT, then future CT at 18-24 months (from today's scan) is considered optional for low-risk patients, but is recommended for high-risk patients. This recommendation follows the consensus statement: Guidelines for Management of Incidental Pulmonary Nodules Detected on CT Images: From the Fleischner Society 2017; Radiology 2017; 284:228-243. Aortic Atherosclerosis (ICD10-I70.0). Electronically Signed   By: Telford Nab M.D.   On: 07/10/2022 03:32   CT Angio Chest PE W and/or Wo  Contrast  Result Date: 07/10/2022 CLINICAL DATA:  Shortness of breath.  PE suspected  EXAM: CT ANGIOGRAPHY CHEST WITH CONTRAST TECHNIQUE: Multidetector CT imaging of the chest was performed using the standard protocol during bolus administration of intravenous contrast. Multiplanar CT image reconstructions and MIPs were obtained to evaluate the vascular anatomy. RADIATION DOSE REDUCTION: This exam was performed according to the departmental dose-optimization program which includes automated exposure control, adjustment of the mA and/or kV according to patient size and/or use of iterative reconstruction technique. CONTRAST:  62m OMNIPAQUE IOHEXOL 350 MG/ML SOLN COMPARISON:  Radiographs 07/09/2022 FINDINGS: Cardiovascular: The central pulmonary arteries are well opacified and demonstrate no filling defects to suggest pulmonary embolism. The lower lung segmental and subsegmental pulmonary arteries are not well evaluated due to respiratory motion. Possible filling defects in the right lower lobe pulmonary arteries are favored artifactual secondary to motion and if real are favored to represent eccentric chronic thrombus rather than acute embolism. (For example circa series 6/image 231 in the posterior right lower lobe and lateral pulmonary arteries). Cardiomegaly. Reflux of contrast into the hepatic veins compatible with elevated right heart pressures. Dilated main pulmonary artery measuring 35 mm in diameter which can be seen with pulmonary hypertension. Coronary artery and aortic atherosclerotic calcification. Aortic valve calcification. Mediastinum/Nodes: Mediastinal lymphadenopathy measuring up to 1.2 cm in short axis (precarinal node series 6/image 129). Unremarkable esophagus. Lungs/Pleura: Diffuse patchy ground-glass opacity and interlobular septal thickening compatible with pulmonary edema. Diffuse bronchial wall thickening and peripheral predominant centrilobular micronodularity. Small right-greater-than-left  pleural effusions. 8 mm pulmonary nodule in the right upper lobe laterally (5/45). Bronchiectasis/bronchiolectasis in the bilateral lower lobes and right middle lobe is well as the lingula. Upper Abdomen: No acute abnormality. Musculoskeletal: Compression deformities of the superior endplate of multiple mid and lower thoracic vertebral bodies is favored chronic. No evidence of acute fracture. Review of the MIP images confirms the above findings. IMPRESSION: 1. No central pulmonary embolism. The bilateral lower lobe segmental and subsegmental pulmonary arteries are not well visualized due to respiratory motion. Possible filling defects in the right lower lobe lateral and posterior segmental arteries are favored due to motion however acute or chronic thromboembolism is difficult to exclude. 2. Cardiomegaly with evidence of pulmonary edema and small right-greater-than-left pleural effusions. 3. Diffuse bronchial wall thickening and peripheral centrilobular micro nodularity suggestive of airway inflammation/infection. 4. Dilated main pulmonary artery which can be seen with pulmonary hypertension. 5. Mediastinal lymphadenopathy measuring up to 1.2 cm in short axis is favored to be reactive. 6. Right upper lobe pulmonary nodule measuring 8 mm. Non-contrast chest CT at 6-12 months is recommended. If the nodule is stable at time of repeat CT, then future CT at 18-24 months (from today's scan) is considered optional for low-risk patients, but is recommended for high-risk patients. This recommendation follows the consensus statement: Guidelines for Management of Incidental Pulmonary Nodules Detected on CT Images: From the Fleischner Society 2017; Radiology 2017; 284:228-243. Aortic Atherosclerosis (ICD10-I70.0). Electronically Signed   By: TPlacido SouM.D.   On: 07/10/2022 01:22   DG Chest Port 1 View  Result Date: 07/09/2022 CLINICAL DATA:  [id number] ([facility] [id number]: PORTABLE CHEST 1 VIEW COMPARISON:  Chest  x-ray 12/11/2021, chest x-ray 10/09/2020 FINDINGS: The heart and mediastinal contours are unchanged. Aortic calcification. Question bilateral mid to lower lung zones patchy airspace interstitial opacities with likely at least trace to small volume pleural effusions. No pneumothorax. No acute osseous abnormality. IMPRESSION: Question bilateral mid to lower lung zones patchy airspace interstitial opacities with likely at least trace to small volume pleural effusions. Recommend CT chest with  intravenous contrast for further evaluation. Electronically Signed   By: Iven Finn M.D.   On: 07/09/2022 23:19   CT Head Wo Contrast  Result Date: 07/09/2022 CLINICAL DATA:  Mental status change, unknown cause EXAM: CT HEAD WITHOUT CONTRAST TECHNIQUE: Contiguous axial images were obtained from the base of the skull through the vertex without intravenous contrast. RADIATION DOSE REDUCTION: This exam was performed according to the departmental dose-optimization program which includes automated exposure control, adjustment of the mA and/or kV according to patient size and/or use of iterative reconstruction technique. COMPARISON:  CT head 12/11/2021 FINDINGS: Brain: Cerebral ventricle sizes are concordant with the degree of cerebral volume loss. Patchy and confluent areas of decreased attenuation are noted throughout the deep and periventricular white matter of the cerebral hemispheres bilaterally, compatible with chronic microvascular ischemic disease. Similar-appearing cystic lesion in the anterior left frontal lobe. No evidence of large-territorial acute infarction. No parenchymal hemorrhage. No mass lesion. No extra-axial collection. No mass effect or midline shift. No hydrocephalus. Basilar cisterns are patent. Vascular: No hyperdense vessel. Skull: No acute fracture or focal lesion. Question left temporomandibular joint dislocation. Degenerative changes of the right temporomandibular joint. Sinuses/Orbits: Sphenoid wall  hypertrophy on the right suggestive of chronic sinusitis. Paranasal sinuses and mastoid air cells are clear. Bilateral lens replacement. Otherwise the orbits are unremarkable. Other: None. IMPRESSION: 1. No acute intracranial abnormality. 2. Question left temporomandibular joint dislocation. Correlate with physical exam. Electronically Signed   By: Iven Finn M.D.   On: 07/09/2022 23:17    Scheduled Meds:  ascorbic acid  1,000 mg Oral Daily   [START ON 07/11/2022] aspirin EC  81 mg Oral Daily   atorvastatin  80 mg Oral Daily   dextrose  1 ampule Intravenous Once   ferrous gluconate  324 mg Oral Daily   insulin aspart  0-9 Units Subcutaneous Q4H   insulin aspart  10 Units Intravenous Once   insulin NPH Human  14 Units Subcutaneous QAC breakfast   And   insulin NPH Human  8 Units Subcutaneous QHS   metoprolol tartrate  25 mg Oral BID   Continuous Infusions:  calcium gluconate Stopped (07/10/22 0748)   heparin 950 Units/hr (07/10/22 0049)   PRN Meds: acetaminophen, ALPRAZolam, magnesium hydroxide, nitroGLYCERIN, ondansetron (ZOFRAN) IV, polyethylene glycol, traZODone  Time spent: 35 minutes  Author: Val Riles. MD [facility] 07/10/2022 8:50 AM  To reach On-call, see care teams to locate the attending and reach out to them via [email or web address]. If 7PM-7AM, please contact night-coverage If you still have difficulty reaching the attending provider, please page the [facility] (Director on Call) for [facility] on amion for assistance.

## 2022-07-10 NOTE — Assessment & Plan Note (Signed)
-   The patient will be placed on supplement coverage with NovoLog. ?- We will continue her basal coverage. ?

## 2022-07-10 NOTE — ED Notes (Signed)
Patient denies pain and is resting comfortably.  

## 2022-07-10 NOTE — Consult Note (Addendum)
Cardiology Consultation   Patient ID: Natalie Adams MRN: [id number]; DOB: 1930-02-05  Admit date: 07/09/2022 Date of Consult: 07/10/2022  PCP:  Merryl Hacker No   [facility] Providers Cardiologist:  None      New consult done by Dr Caryl Comes  Patient Profile:   Natalie Adams is a 89 y.o. female with a hx of diabetes, hypertension, hyperlipidemia, asthma, osteoarthritis, morbid obesity who is being seen 07/10/2022 for the evaluation of NSTEMI at the request of Dr. Sidney Ace.  History of Present Illness:   Natalie Adams is a 89 year old female with a previously mentioned past medical history.  And her last hospitalization was 12/15/2021 for acute encephalopathy, diarrhea, and gastroenteritis.  She presents to the [facility] emergency department on 07/09/2022 via EMS complaining of shortness of breath after being placed on the bedside commode.  She is altered at baseline and bedbound.  Patient received a breathing treatment at home with no changes she arrived to the emergency department being tachycardic and tachypneic, and hypothermic.  Patient's daughter arrived to the emergency department to help offer information on the patient's status and history.  She stated that she had been having difficulty finding a primary care physician for mother and states that she had recently had an appointment for possible in-home hospice in order to be seen.  According to the chart the patient was not as communicative as her usual and had not reported any symptoms of chest pain, fever chills, nausea vomiting or abdominal pain, there was no bleeding that have been noted no dysuria or flank pain.  They endorse she had continued to have mild lower extremity edema that had not worsen but had not recently had any episodes of shortness of breath or increased work of breathing. Family remains at the bedside to assist with questions on symptoms and history.  Initial vital signs: Blood pressure 131/85, pulse 108, respirations of 41,  temperature 96.7  Pertinent labs: Potassium 5.9, blood glucose 170, albumin of 3, AST 113, ALT of 75, total bilirubin of 1.3, hemoglobin of 10.7, BNP 2688.1, high-sensitivity troponin 641 and 729, D-dimer of 1.85, respiratory panel is negative  Imaging: Chest x-ray revealed questionable bilateral mid to lower lung zones patchy airspace interstitial opacities with likely at least trace to small volume pleural effusions recommend CT of the chest; CTA of the chest revealed no central pulmonary embolism, possible filling defects in the right lower lobe lateral and posterior segmental arteries are favored due to motion however acute or chronic thromboembolism is difficult to exclude, cardiomegaly with evidence of pulmonary edema and small right greater than left pleural effusion, diffuse bronchial wall thickening and peripheral centrilobular micro nodularity suggestive of airway inflammation/infection, dilated main pulmonary artery which can be seen with pulmonary hypertension, mediastinal lymphadenopathy measuring up to 1.2 cm is favored to be reactive, right upper lobe pulmonary nodule measuring 8 mm  Medications administered in the emergency department: Morphine 4 mg IVP, labetalol 20 mg IVP, heparin bolus and infusion, furosemide 40 mg IVP, lactated Ringer's of 1 L  Cardiology was consulted this morning for concerns of NSTEMI.  Past Medical History:  Diagnosis Date   Arthritis    Asthma    Diabetes mellitus without complication ([place])    Hypertension    Morbid obesity ([place])     Past Surgical History:  Procedure Laterality Date   CATARACT EXTRACTION, BILATERAL     ORIF FEMUR FRACTURE Left 06/05/2020   Procedure: OPEN REDUCTION INTERNAL FIXATION (ORIF) DISTAL FEMUR FRACTURE;  Surgeon: Milagros Evener  J, MD;  Location: [facility] ORS;  Service: Orthopedics;  Laterality: Left;     Home Medications:  Prior to Admission medications   Medication Sig Start Date End Date Taking? Authorizing Provider   amLODipine (NORVASC) 10 MG tablet Take 5 mg by mouth daily. 05/01/17  Yes [provider]  ascorbic acid (VITAMIN C) 1000 MG tablet Take 1,000 mg by mouth daily.   Yes [provider]  Boswellia-Glucosamine-Vit D (OSTEO BI-FLEX ONE PER DAY PO) Take 1 capsule by mouth daily.   Yes [provider]  Cranberry 300 MG tablet Take 300 mg by mouth 2 (two) times daily.   Yes [provider]  Ferrous Fumarate (IRON) 18 MG TBCR Take 1 tablet (18 mg total) by mouth daily. Can take any over-the-counter iron supplement. 06/09/20  Yes Enzo Bi, MD  insulin lispro (HUMALOG) 100 UNIT/ML injection Sliding scale insulin # of units = (BS-150/50) TID [facility] 05/09/16  Yes [provider]  insulin NPH Human (NOVOLIN N) 100 UNIT/ML injection Inject 8-14 Units into the skin 2 (two) times daily before a meal. 14 units qam 8 units qpm 05/09/16  Yes [provider]  ipratropium-albuterol (DUONEB) 0.5-2.5 (3) MG/3ML SOLN SMARTSIG:1 Ampule(s) Via Nebulizer Every 6 Hours PRN 03/25/20  Yes [provider]  metoprolol tartrate (LOPRESSOR) 25 MG tablet Take 12.5 mg by mouth 2 (two) times daily. 05/08/20  Yes [provider]  Multiple Vitamins tablet Take by mouth. 02/11/11  Yes [provider]  Omega-3 Fatty Acids (FISH OIL) 1200 MG CAPS Take by mouth.   Yes [provider]  pravastatin (PRAVACHOL) 40 MG tablet Take 40 mg by mouth at bedtime. 05/08/20  Yes [provider]  acetaminophen (TYLENOL) 500 MG tablet Take 2 tablets (1,000 mg total) by mouth 2 (two) times daily as needed for mild pain or moderate pain. 06/09/20   Enzo Bi, MD  Dietary Management Product (ALGESIS) TABS Take by mouth. Patient not taking: Reported on 07/09/2022    [provider]  polyethylene glycol (MIRALAX / GLYCOLAX) 17 g packet Take 17 g by mouth daily. 12/16/21   Loletha Grayer, MD    Inpatient Medications: Scheduled Meds:  ascorbic acid  1,000 mg Oral  Daily   [START ON 07/11/2022] aspirin EC  81 mg Oral Daily   atorvastatin  80 mg Oral Daily   dextrose  1 ampule Intravenous Once   ferrous gluconate  324 mg Oral Daily   insulin aspart  0-9 Units Subcutaneous Q4H   insulin aspart  10 Units Intravenous Once   insulin NPH Human  14 Units Subcutaneous QAC breakfast   And   insulin NPH Human  8 Units Subcutaneous QHS   metoprolol tartrate  25 mg Oral BID   Continuous Infusions:  calcium gluconate Stopped (07/10/22 0748)   heparin 950 Units/hr (07/10/22 0910)   PRN Meds: acetaminophen, ALPRAZolam, magnesium hydroxide, nitroGLYCERIN, ondansetron (ZOFRAN) IV, polyethylene glycol, traZODone  Allergies:    Allergies  Allergen Reactions   Amoxicillin-Pot Clavulanate Diarrhea   Hydrochlorothiazide Other (See Comments)    "she got shaky and fell" "she got shaky and fell"     Social History:   Social History   Socioeconomic History   Marital status: Divorced    Spouse name: Not on file   Number of children: Not on file   Years of education: Not on file   Highest education level: Not on file  Occupational History   Not on file  Tobacco Use   Smoking  status: Never   Smokeless tobacco: Never  Substance and Sexual Activity   Alcohol use: Not on file   Drug use: Not on file   Sexual activity: Not on file  Other Topics Concern   Not on file  Social History Narrative   Not on file   Social Determinants of Health   Financial Resource Strain: Not on file  Food Insecurity: Not on file  Transportation Needs: Not on file  Physical Activity: Not on file  Stress: Not on file  Social Connections: Not on file  Intimate Partner Violence: Not on file    Family History:    Family History  Problem Relation Age of Onset   Hypertension Mother      ROS:  Please see the history of present illness.  Review of Systems  Reason unable to perform ROS: Patient will open eyes but will not answer any questions, family at the bedside satted  she previously on arrival to the ER had chest pain and her SOB was a sudden onset.    All other ROS reviewed and negative.     Physical Exam/Data:   Vitals:   07/10/22 0547 07/10/22 0700 07/10/22 0800 07/10/22 0900  BP: (!) 152/103 (!) 148/86 115/71 119/74  Pulse: (!) 105 99 86 84  Resp: (!) 38 (!) 22 (!) 25 (!) 21  Temp: 98.1 F (36.7 C)     TempSrc: Rectal     SpO2: 100% 100% 99% 100%  Weight:      Height:        Intake/Output Summary (Last 24 hours) at 07/10/2022 0947 Last data filed at 07/10/2022 0050 Gross per 24 hour  Intake 1000 ml  Output --  Net 1000 ml      07/10/2022   12:22 AM 12/11/2021    9:03 PM 10/09/2020    4:53 AM  Last 3 Weights  Weight (lbs) 160 lb 158 lb 15.2 oz 200 lb  Weight (kg) 72.576 kg 72.1 kg 90.719 kg     Body mass index is 25.06 kg/m.  General:  Frail, ill-appearing, in no acute distress HEENT: normal Neck: unable to assess JVD due to patient positioning and ability to move head Vascular: No carotid bruits; Distal pulses 2+ bilaterally Cardiac:  normal S1, S2; RRR; no murmur  Lungs:  coarse with diminished bases anteriorly to auscultation bilaterally, respirations are unlabored at rest on 2L O2 via [place] Abd: soft, nontender, no hepatomegaly  Ext: 1-2+ edema BLE Musculoskeletal:  No deformities, BUE and BLE strength normal and equal Skin: warm and dry, sacral decub with dressing intact Neuro:  opens eyes to name calling but does not verbalize any answeres to questions Psych:  Normal affect   EKG:  The EKG was personally reviewed and demonstrates: Sinus tachycardia with rate of 109, biatrial enlargement with left ventricular hypertrophy and T wave abnormalities in the inferior leads Telemetry:  Telemetry was personally reviewed and demonstrates:  sinus to sinus tachycardia with rates of 80-110  Relevant CV Studies: Echocardiogram ordered and pending  Laboratory Data:  High Sensitivity Troponin:   Recent Labs  Lab 07/09/22 2208  07/10/22 0021  TROPONINIHS 641* 729*     Chemistry Recent Labs  Lab 07/09/22 2208 07/10/22 0520  NA 136 136  K 5.9* 4.8  CL 104 102  CO2 24 26  GLUCOSE 170* 230*  BUN 23 24*  CREATININE 0.90 0.88  CALCIUM 8.9 9.1  GFRNONAA >60 >60  ANIONGAP 8 8    Recent Labs  Lab 07/09/22 2208  PROT 6.8  ALBUMIN 3.0*  AST 113*  ALT 75*  ALKPHOS 110  BILITOT 1.3*   Lipids  Recent Labs  Lab 07/10/22 0520  CHOL 138  TRIG 51  HDL 73  LDLCALC 55  CHOLHDL 1.9    Hematology Recent Labs  Lab 07/09/22 2208 07/10/22 0520  WBC 7.8 8.1  RBC 5.30* 5.69*  HGB 10.7* 11.3*  HCT 36.1 38.3  MCV 68.1* 67.3*  MCH 20.2* 19.9*  MCHC 29.6* 29.5*  RDW 17.3* 18.1*  PLT 223 179   Thyroid No results for input(s): "TSH", "FREET4" in the last 168 hours.  BNP Recent Labs  Lab 07/09/22 2208  BNP 2,688.1*    DDimer  Recent Labs  Lab 07/10/22 0419  DDIMER 1.85*     Radiology/Studies:  US Venous Img Lower Bilateral (DVT)  Result Date: 07/10/2022 CLINICAL DATA:  [id number] Acute pulmonary embolism ([place]) [id number] EXAM: BILATERAL LOWER EXTREMITY VENOUS DOPPLER ULTRASOUND TECHNIQUE: Gray-scale sonography with graded compression, as well as color Doppler and duplex ultrasound were performed to evaluate the lower extremity deep venous systems from the level of the common femoral vein and including the common femoral, femoral, profunda femoral, popliteal and calf veins including the posterior tibial, peroneal and gastrocnemius veins when visible. The superficial great saphenous vein was also interrogated. Spectral Doppler was utilized to evaluate flow at rest and with distal augmentation maneuvers in the common femoral, femoral and popliteal veins. COMPARISON:  CT CAP earlier same day. FINDINGS: RIGHT LOWER EXTREMITY VENOUS Normal compressibility of the RIGHT common femoral, superficial femoral, and popliteal veins, as well as the visualized calf veins. Visualized portions of profunda femoral vein and  great saphenous vein unremarkable. No filling defects to suggest DVT on grayscale or color Doppler imaging. Doppler waveforms show normal direction of venous flow, normal respiratory plasticity and response to augmentation. OTHER No evidence of superficial thrombophlebitis or abnormal fluid collection. Limitations: Patient body habitus LEFT LOWER EXTREMITY VENOUS Normal compressibility of the LEFT common femoral, superficial femoral, and popliteal veins, as well as the visualized calf veins. Visualized portions of profunda femoral vein and great saphenous vein unremarkable. No filling defects to suggest DVT on grayscale or color Doppler imaging. Doppler waveforms show normal direction of venous flow, normal respiratory plasticity and response to augmentation. OTHER No evidence of superficial thrombophlebitis or abnormal fluid collection. Limitations: Patient body habitus IMPRESSION: No evidence of femoropopliteal DVT or superficial thrombophlebitis within either lower extremity. Michaelle Birks, MD [facility] Radiology Electronically Signed   By: Michaelle Birks M.D.   On: 07/10/2022 06:21   CT Angio Chest/Abd/Pel for Dissection W and/or Wo Contrast  Result Date: 07/10/2022 CLINICAL DATA:  Tachycardia, hypertension and elevated troponins. Shortness of breath also. EXAM: CT ANGIOGRAPHY CHEST, ABDOMEN AND PELVIS TECHNIQUE: Non-contrast CT of the chest was initially obtained. Multidetector CT imaging through the chest, abdomen and pelvis was performed using the standard protocol during bolus administration of intravenous contrast. Multiplanar reconstructed images and MIPs were obtained and reviewed to evaluate the vascular anatomy. RADIATION DOSE REDUCTION: This exam was performed according to the departmental dose-optimization program which includes automated exposure control, adjustment of the mA and/or kV according to patient size and/or use of iterative reconstruction  technique. CONTRAST:  70m OMNIPAQUE IOHEXOL 350 MG/ML SOLN COMPARISON:  CTA chest yesterday at 11:57 p.m., CT abdomen and pelvis with IV contrast dated 12/11/2021. FINDINGS: CTA CHEST FINDINGS Cardiovascular: Again noted are nonoccluding age-indeterminate subsegmental right lower lobe arterial emboli with  involvement of 2 vessels on 6: 88 and 89 and a single small-vessel on 6:94 and 95, also seen on coronal reconstruction series 9, image 83. No large vessel or further arterial embolus is seen. The pulmonary trunk again measuring 3.5 cm indicating arterial hypertension with IVC and hepatic vein contrast reflux consistent with elevated right heart pressures. There is no increased RV/LV ratio. Moderate panchamber cardiomegaly continues to be seen with three-vessel coronary artery calcifications and no pericardial effusion. There are mildly prominent superior pulmonary veins. There is moderate patchy atherosclerosis in the aorta, heavy calcification along the valve leaflets, moderate calcification left subclavian artery. There is no aneurysm, stenosis or dissection. Mediastinum/Nodes: There are mildly enlarged multifocal mediastinal lymph nodes and a few mildly prominent hilar nodes but none greater than 1.2 cm in short axis Lungs/Pleura: Respiratory motion limits evaluation of the lungs. There is mild interstitial edema, moderate right and small layering left pleural effusions and again noted and diffuse bronchial thickening. 8 mm right upper lobe nodule is again noted on 7:53. There is posterior atelectasis. Scattered linear scar-like opacities. No focal pneumonia is evident. No pneumothorax. Musculoskeletal: Multiple mild mid to lower thoracic spine compression fracture deformities. Osteopenia and degenerative change. The ribcage is intact. Review of the MIP images confirms the above findings. CTA ABDOMEN AND PELVIS FINDINGS VASCULAR Aorta: Moderately calcified without stenosis, aneurysm or dissection mildly  tortuous. Celiac: There are ostial wall calcifications but no stenosis or branch occlusion. SMA: Normal. Renals: Both are single. The left renal artery demonstrates a 75% calcific origin stenosis and is otherwise clear. On the right there are ostial calcifications but no stenosis. IMA: Normal. Inflow: There are mild-to-moderate patchy calcifications without flow-limiting stenoses Veins: Patent suprarenal IVC. The other veins are unopacified and not evaluated. Review of the MIP images confirms the above findings. NON-VASCULAR Hepatobiliary: There is a 1.7 cm densely calcified stone in the gallbladder. No wall thickening or biliary dilatation. No focal abnormality of the liver. Pancreas: No abnormality. Spleen: No abnormality. Adrenals/Urinary Tract: No adrenal or renal mass. There is increased bilateral mild hydroureteronephrosis without a visible ureteral filling defect. There is contrast in the collecting systems which would obscure stones if present. The bladder is increased in distention up to the level of L5. There is thickening and trabeculation of the bladder and numerous small bladder diverticula. No masslike wall thickening. Stomach/Bowel: No dilatation or wall thickening including the appendix. Moderate stool retention ascending colon. Uncomplicated scattered diverticulosis. Lymphatic: No adenopathy is seen. Reproductive: Unremarkable uterus and left ovary. There is homogeneous thin-walled right ovarian cyst measuring 3.2 cm, with Hounsfield density of 16. Other: Trace deep pelvic ascites. No free hemorrhage, free air or incarcerated hernias. Small umbilical fat hernia. Musculoskeletal: Osteopenia and degenerative change lumbar spine. Mild-to-moderate hip DJD. No acute or other significant osseous findings. Review of the MIP images confirms the above findings. IMPRESSION: 1. Again noted age-indeterminate subsegmental right lower lobe arterial emboli. No large-vessel or further arterial embolus is seen.  Prominent pulmonary trunk as before. 2. Cardiomegaly with mild interstitial edema, moderate right and small left pleural effusions and diffuse bronchial thickening. 3. Aortic and coronary artery atherosclerosis. 4. Aortic valve leaflet calcifications which may be associated with stenosis or insufficiency. 5. Contrast reflux into the IVC and hepatic veins consistent with elevated right heart pressures. No increased RV/LV ratio. 6. 75% calcific origin stenosis of the left renal artery. 7. 3.2 cm thin-walled right ovarian cyst. Recommend follow-up pelvic ultrasound in 6-12 months. Reference: JACR 2020 Feb;17(2):248-254 8. Increased bilateral  hydroureteronephrosis without visible ureteral filling defect. 9. Thickened trabeculated bladder with numerous small diverticula. 10. Cholelithiasis. 11. Trace deep pelvic ascites. 12. Osteopenia and degenerative change with multiple mild mid to lower thoracic spine compression fracture deformities. 13. 8 mm right upper lobe nodule. Non-contrast chest CT at 6-12 months is recommended. If the nodule is stable at time of repeat CT, then future CT at 18-24 months (from today's scan) is considered optional for low-risk patients, but is recommended for high-risk patients. This recommendation follows the consensus statement: Guidelines for Management of Incidental Pulmonary Nodules Detected on CT Images: From the Fleischner Society 2017; Radiology 2017; 284:228-243. Aortic Atherosclerosis (ICD10-I70.0). Electronically Signed   By: Telford Nab M.D.   On: 07/10/2022 03:32   CT Angio Chest PE W and/or Wo Contrast  Result Date: 07/10/2022 CLINICAL DATA:  Shortness of breath.  PE suspected EXAM: CT ANGIOGRAPHY CHEST WITH CONTRAST TECHNIQUE: Multidetector CT imaging of the chest was performed using the standard protocol during bolus administration of intravenous contrast. Multiplanar CT image reconstructions and MIPs were obtained to evaluate the vascular anatomy. RADIATION DOSE  REDUCTION: This exam was performed according to the departmental dose-optimization program which includes automated exposure control, adjustment of the mA and/or kV according to patient size and/or use of iterative reconstruction technique. CONTRAST:  63m OMNIPAQUE IOHEXOL 350 MG/ML SOLN COMPARISON:  Radiographs 07/09/2022 FINDINGS: Cardiovascular: The central pulmonary arteries are well opacified and demonstrate no filling defects to suggest pulmonary embolism. The lower lung segmental and subsegmental pulmonary arteries are not well evaluated due to respiratory motion. Possible filling defects in the right lower lobe pulmonary arteries are favored artifactual secondary to motion and if real are favored to represent eccentric chronic thrombus rather than acute embolism. (For example circa series 6/image 231 in the posterior right lower lobe and lateral pulmonary arteries). Cardiomegaly. Reflux of contrast into the hepatic veins compatible with elevated right heart pressures. Dilated main pulmonary artery measuring 35 mm in diameter which can be seen with pulmonary hypertension. Coronary artery and aortic atherosclerotic calcification. Aortic valve calcification. Mediastinum/Nodes: Mediastinal lymphadenopathy measuring up to 1.2 cm in short axis (precarinal node series 6/image 129). Unremarkable esophagus. Lungs/Pleura: Diffuse patchy ground-glass opacity and interlobular septal thickening compatible with pulmonary edema. Diffuse bronchial wall thickening and peripheral predominant centrilobular micronodularity. Small right-greater-than-left pleural effusions. 8 mm pulmonary nodule in the right upper lobe laterally (5/45). Bronchiectasis/bronchiolectasis in the bilateral lower lobes and right middle lobe is well as the lingula. Upper Abdomen: No acute abnormality. Musculoskeletal: Compression deformities of the superior endplate of multiple mid and lower thoracic vertebral bodies is favored chronic. No evidence of  acute fracture. Review of the MIP images confirms the above findings. IMPRESSION: 1. No central pulmonary embolism. The bilateral lower lobe segmental and subsegmental pulmonary arteries are not well visualized due to respiratory motion. Possible filling defects in the right lower lobe lateral and posterior segmental arteries are favored due to motion however acute or chronic thromboembolism is difficult to exclude. 2. Cardiomegaly with evidence of pulmonary edema and small right-greater-than-left pleural effusions. 3. Diffuse bronchial wall thickening and peripheral centrilobular micro nodularity suggestive of airway inflammation/infection. 4. Dilated main pulmonary artery which can be seen with pulmonary hypertension. 5. Mediastinal lymphadenopathy measuring up to 1.2 cm in short axis is favored to be reactive. 6. Right upper lobe pulmonary nodule measuring 8 mm. Non-contrast chest CT at 6-12 months is recommended. If the nodule is stable at time of repeat CT, then future CT at 18-24 months (from today's scan)  is considered optional for low-risk patients, but is recommended for high-risk patients. This recommendation follows the consensus statement: Guidelines for Management of Incidental Pulmonary Nodules Detected on CT Images: From the Fleischner Society 2017; Radiology 2017; 284:228-243. Aortic Atherosclerosis (ICD10-I70.0). Electronically Signed   By: Placido Sou M.D.   On: 07/10/2022 01:22   DG Chest Port 1 View  Result Date: 07/09/2022 CLINICAL DATA:  [id number] Sepsis [facility]) [id number] EXAM: PORTABLE CHEST 1 VIEW COMPARISON:  Chest x-ray 12/11/2021, chest x-ray 10/09/2020 FINDINGS: The heart and mediastinal contours are unchanged. Aortic calcification. Question bilateral mid to lower lung zones patchy airspace interstitial opacities with likely at least trace to small volume pleural effusions. No pneumothorax. No acute osseous abnormality. IMPRESSION: Question bilateral mid to lower lung zones patchy  airspace interstitial opacities with likely at least trace to small volume pleural effusions. Recommend CT chest with intravenous contrast for further evaluation. Electronically Signed   By: Iven Finn M.D.   On: 07/09/2022 23:19   CT Head Wo Contrast  Result Date: 07/09/2022 CLINICAL DATA:  Mental status change, unknown cause EXAM: CT HEAD WITHOUT CONTRAST TECHNIQUE: Contiguous axial images were obtained from the base of the skull through the vertex without intravenous contrast. RADIATION DOSE REDUCTION: This exam was performed according to the departmental dose-optimization program which includes automated exposure control, adjustment of the mA and/or kV according to patient size and/or use of iterative reconstruction technique. COMPARISON:  CT head 12/11/2021 FINDINGS: Brain: Cerebral ventricle sizes are concordant with the degree of cerebral volume loss. Patchy and confluent areas of decreased attenuation are noted throughout the deep and periventricular white matter of the cerebral hemispheres bilaterally, compatible with chronic microvascular ischemic disease. Similar-appearing cystic lesion in the anterior left frontal lobe. No evidence of large-territorial acute infarction. No parenchymal hemorrhage. No mass lesion. No extra-axial collection. No mass effect or midline shift. No hydrocephalus. Basilar cisterns are patent. Vascular: No hyperdense vessel. Skull: No acute fracture or focal lesion. Question left temporomandibular joint dislocation. Degenerative changes of the right temporomandibular joint. Sinuses/Orbits: Sphenoid wall hypertrophy on the right suggestive of chronic sinusitis. Paranasal sinuses and mastoid air cells are clear. Bilateral lens replacement. Otherwise the orbits are unremarkable. Other: None. IMPRESSION: 1. No acute intracranial abnormality. 2. Question left temporomandibular joint dislocation. Correlate with physical exam. Electronically Signed   By: Iven Finn M.D.    On: 07/09/2022 23:17     Assessment and Plan:   Elevated high-sensitivity troponins/NSTEMI -hs troponins trended 641 and 729 -patient had complaints of chest pain on arrival -can be supply demand mismatch from elevated BNP, hypertensive emergency, and suspected PE -started on heparin drip, can be maintained for 48 hours -continue cardiac monitor -echocardiogram ordered and pending further recommendations to follow -patient would be poor candidate for invasive procedures due to age, being bedridden, and co-morbidities -EKG for pain or changes as needed -continue on asa and statin   Elevated BNP with suspected acute congestive heart failure -Arrived with initial complaints of shortness of breath and peripheral edema -BNP 2688.1 -Given furosemide in the emergency department -Echocardiogram ordered and pending with further recommendations to follow -Recommend continuing furosemide -Patient is +1 L of fluid -Daily weights, I's and O's, and  low-sodium diet  Elevated D-dimer -resulted at 1.85 -with complaints of shortness of breath sent for CTA -scans revealed right lower lobe arterial emboli with no large vessel or further arterial embolus is seen -continue on heparin infusion, will need to transition to [place] prior to discharge -continued on oxygen therapy,  titrate FiO2 to maintain O2 sats greater than equal to 92%  Hypertension -blood pressure 119/74 -continued on metoprolol tartrate 25 mg twice daily -vital signs per un it protocol  Hyperkalemia -resolved after received D50 and insulin -serum potassium 4.8 -daily bmp -monitor.trend.replete electrolytes as needed  Type 2 diabetes -continued on insulin therapy -management per IM  7.   Hyperlipidemia -continued on atorvastatin -LDL 55 8. asymmetric septal hypertrophy  Risk Assessment/Risk Scores:     TIMI Risk Score for Unstable Angina or Non-ST Elevation MI:   The patient's TIMI risk score is 4, which indicates a 20%  risk of all cause mortality, new or recurrent myocardial infarction or need for urgent revascularization in the next 14 days.  New York Heart Association (NYHA) Functional Class NYHA Class III        For questions or updates, please contact [place] Please consult [email or web address] for contact info under    Signed, SHERI HAMMOCK, NP  07/10/2022 9:47 AM Agree with the note as above.  Elevated troponins with positive D-dimer and a (probably) positive CT scan for pulmonary embolism is likely the explanation.  She is not a candidate for further ACS workup.  Modest left ventricular dysfunction without interventricular septum of 16 mm.  This should be repeated with echo contrast as the implications for family screening for hypertrophic cardiomyopathy would be informed  As blood pressure improves would resume beta-blockers would probably hold off on a vasodilators like amlodipine as we do not know anything that the physiology of her outflow tract.  Will be available as needed.  Please call for questions

## 2022-07-10 NOTE — Progress Notes (Signed)
[place] for heparin infusion Indication: ACS/STEMI, PE  Allergies  Allergen Reactions   Amoxicillin-Pot Clavulanate Diarrhea   Hydrochlorothiazide Other (See Comments)    "she got shaky and fell" "she got shaky and fell"     Patient Measurements: Height: '5\' 7"'$  (170.2 cm) Weight: 71.7 kg (158 lb 1.1 oz) IBW/kg (Calculated) : 61.6 Heparin Dosing Weight: 72.6 kg  Vital Signs: Temp: 97.9 F (36.6 C) (03/10 2300) Temp Source: Oral (03/10 1340) BP: 127/71 (03/10 2300) Pulse Rate: 92 (03/10 2300)  Labs: Recent Labs    07/09/22 2208 07/10/22 0021 07/10/22 0520 07/10/22 1104 07/10/22 1638 07/10/22 1800 07/10/22 2029 07/10/22 2205  HGB 10.7*  --  11.3*  --   --   --   --   --   HCT 36.1  --  38.3  --   --   --   --   --   PLT 223  --  179  --   --   --   --   --   APTT  --  23*  --   --   --   --   --   --   LABPROT  --  14.2 14.9  --   --   --   --   --   INR  --  1.1 1.2  --   --   --   --   --   HEPARINUNFRC  --   --   --  >1.10*  --  0.89*  --  1.04*  CREATININE 0.90  --  0.88  --   --   --   --   --   TROPONINIHS 641* 729*  --  920* 727* 670* 564*  --      Estimated Creatinine Clearance: 41.3 mL/min (by C-G formula based on SCr of 0.88 mg/dL).   Medical History: Past Medical History:  Diagnosis Date   Arthritis    Asthma    Diabetes mellitus without complication ([place])    Hypertension    Morbid obesity ([place])     Assessment: Pt is a 89 yo female presenting to ED c/o SOB, found with elevated Troponin I level. And right lower lobe arterial emboli  -No anticoagulants PTA per med rec  3/10 1104  HL>1.10  Supratherapeutic-hold x 1 hr and resume at 800 units/hr  Goal of Therapy:  Heparin level 0.3-0.7 units/ml Monitor platelets by anticoagulation protocol: Yes   3/10 1104  HL>1.10 3/10 1800 HL 0.89 3/10 2205 HL 1.04, supratherapeutic  Plan:  Decrease heparin infusion to 650 units/hr Will recheck HL in 8 hr after  rate change CBC daily while on heparin  Renda Rolls, PharmD, [facility] 07/10/2022 11:27 PM

## 2022-07-10 NOTE — ED Notes (Signed)
Per pharmacy pt heparin level high, recommended to hold heparin x1 hr and then restart at new rate.

## 2022-07-10 NOTE — ED Notes (Signed)
Pt currently asleep, Pt vitals all WNL, pt very hard to arouse. Per daughter at bedside, pt was very restless throughout the night and has only been asleep for a few hours. Daughter, Natalie Adams, said "I don't think she will be able to swallow pills for you right now, with the way she is." Will hold PO meds at this time.

## 2022-07-10 NOTE — ED Notes (Signed)
Received report from [place], RN  Assumed care at this time.

## 2022-07-10 NOTE — Progress Notes (Signed)
An USGPIV (ultrasound guided PIV) has been placed for short-term vasopressor infusion. A correctly placed ivWatch must be used when administering Vasopressors. Should this treatment be needed beyond 72 hours, central line access should be obtained.  It will be the responsibility of the bedside nurse to follow best practice to prevent extravasations.   ?

## 2022-07-10 NOTE — Assessment & Plan Note (Signed)
-   Potassium will be replaced and magnesium level will be checked. ?

## 2022-07-10 NOTE — Progress Notes (Signed)
[place] for heparin infusion Indication: ACS/STEMI  Allergies  Allergen Reactions   Amoxicillin-Pot Clavulanate Diarrhea   Hydrochlorothiazide Other (See Comments)    "she got shaky and fell" "she got shaky and fell"     Patient Measurements: Height: '5\' 7"'$  (170.2 cm) IBW/kg (Calculated) : 61.6 Heparin Dosing Weight: 72.6 kg  Vital Signs: Temp: 96.7 F (35.9 C) (03/09 2151) Temp Source: Axillary (03/09 2151) BP: 159/92 (03/09 2230) Pulse Rate: 108 (03/09 2230)  Labs: Recent Labs    07/09/22 2208  HGB 10.7*  HCT 36.1  PLT 223  CREATININE 0.90  TROPONINIHS 641*    CrCl cannot be calculated (Unknown ideal weight.).   Medical History: Past Medical History:  Diagnosis Date   Arthritis    Asthma    Diabetes mellitus without complication ([place])    Hypertension    Morbid obesity ([place])     Assessment: Pt is a 89 yo female presenting to ED c/o SOB, found with elevated Troponin I level.  Goal of Therapy:  Heparin level 0.3-0.7 units/ml Monitor platelets by anticoagulation protocol: Yes   Plan:  Bolus 4000 units x 1 Start heparin infusion at 950 units/hr Will check HL in 8 hr after start of infusion CBC daily while on heparin  Renda Rolls, PharmD, [facility] 07/10/2022 12:12 AM

## 2022-07-10 NOTE — ED Notes (Signed)
Pt BP has increased since arrival and HR has increased as well. Pt appears more restless. MD made aware.

## 2022-07-10 NOTE — ED Notes (Signed)
Pt incontinent of urine, changed incontinent pad and repositioned Purewick.

## 2022-07-10 NOTE — ED Notes (Signed)
Rn turned pt to side and put dressing to sacrum area, daughter reported she noticed yesterday evening pt having skin breakdown to sacrum area.

## 2022-07-10 NOTE — ED Notes (Signed)
US Tech at bedside.

## 2022-07-10 NOTE — Assessment & Plan Note (Signed)
-   The patient be admitted to a stepdown unit bed. - We will continue him on IV heparin. - We will continue high-dose statin therapy. - She will be placed on aspirin as well as as needed sublingual nitroglycerin and IV morphine sulfate. - High-dose statin will be given and fasting lipids will be obtained. - 2D echo and cardiology consult to be obtained. - I notified Dr. Caryl Comes about the patient.

## 2022-07-10 NOTE — Assessment & Plan Note (Signed)
-   We will give her D50 insulin as well as IV calcium gluconate. - We will follow potassium level.

## 2022-07-10 NOTE — Progress Notes (Signed)
[place] for heparin infusion Indication: ACS/STEMI, PE  Allergies  Allergen Reactions   Amoxicillin-Pot Clavulanate Diarrhea   Hydrochlorothiazide Other (See Comments)    "she got shaky and fell" "she got shaky and fell"     Patient Measurements: Height: '5\' 7"'$  (170.2 cm) Weight: 72.6 kg (160 lb) IBW/kg (Calculated) : 61.6 Heparin Dosing Weight: 72.6 kg  Vital Signs: Temp: 98.1 F (36.7 C) (03/10 0547) Temp Source: Rectal (03/10 0547) BP: 93/60 (03/10 1000) Pulse Rate: 75 (03/10 1000)  Labs: Recent Labs    07/09/22 2208 07/10/22 0021 07/10/22 0520 07/10/22 1104  HGB 10.7*  --  11.3*  --   HCT 36.1  --  38.3  --   PLT 223  --  179  --   APTT  --  23*  --   --   LABPROT  --  14.2 14.9  --   INR  --  1.1 1.2  --   HEPARINUNFRC  --   --   --  >1.10*  CREATININE 0.90  --  0.88  --   TROPONINIHS 641* 729*  --  920*     Estimated Creatinine Clearance: 41.3 mL/min (by C-G formula based on SCr of 0.88 mg/dL).   Medical History: Past Medical History:  Diagnosis Date   Arthritis    Asthma    Diabetes mellitus without complication ([place])    Hypertension    Morbid obesity ([place])     Assessment: Pt is a 89 yo female presenting to ED c/o SOB, found with elevated Troponin I level. And right lower lobe arterial emboli  -No anticoagulants PTA per med rec  3/10 1104  HL>1.10  Supratherapeutic-hold x 1 hr and resume at 800 units/hr  Goal of Therapy:  Heparin level 0.3-0.7 units/ml Monitor platelets by anticoagulation protocol: Yes   Plan:  3/10 1104  HL>1.10  Supratherapeutic-hold x 1 hr and  decrease heparin infusion 950 units to 800 units/hr Will recheck HL in 8 hr after restart CBC daily while on heparin  Chinita Greenland PharmD Clinical Pharmacist 07/10/2022

## 2022-07-10 NOTE — Assessment & Plan Note (Signed)
-   We will continue her antihypertensives. 

## 2022-07-10 NOTE — Assessment & Plan Note (Signed)
-   This could be secondary to non-STEMI. - It could be acute cor pulmonale in the setting of acute PE as well. - The patient will be diuresed with IV Lasix. - 2D echo and cardiology consult will be obtained as mentioned above.

## 2022-07-10 NOTE — Progress Notes (Signed)
PHARMACIST - PHYSICIAN ORDER COMMUNICATION  CONCERNING: P&T Medication Policy on Herbal Medications  DESCRIPTION:  This patient's order(s) for: Cranberry 300 mg & Osteo Bi-Flex Tabs has been noted.  This product(s) is classified as an "herbal" or natural product. Due to a lack of definitive safety studies or FDA approval, nonstandard manufacturing practices, plus the potential risk of unknown drug-drug interactions while on inpatient medications, the Pharmacy and Therapeutics Committee does not permit the use of "herbal" or natural products of this type within [facility].   ACTION TAKEN: The pharmacy department is unable to verify this order at this time.  Please reevaluate patient's clinical condition at discharge and address if the herbal or natural product(s) should be resumed at that time.  Renda Rolls, PharmD, [facility] 07/10/2022 4:34 AM

## 2022-07-10 NOTE — ED Notes (Signed)
US at bedside

## 2022-07-10 NOTE — Assessment & Plan Note (Signed)
-   Chest CTA suspicious but nonconclusive for PE. - We will obtain a VQ scan. - The patient will be on IV heparin as mentioned above. - Will obtain bilateral lower extremity venous Doppler to rule out DVT. - 2D echo will assess for RV strain.

## 2022-07-10 NOTE — ED Provider Notes (Signed)
Patient received in signout from Dr. Jori Moll pending CTA chest reads for admission for NSTEMI.  As below, there were some delays from radiology department getting these reads back.  Within that timeframe, patient became more restless in bed, agitated, tachycardic and hypertensive.  She is unable to relay any changes to her symptoms, but daughter at the bedside indicates that she seems more uncomfortable and tachypneic than when she first arrived.  Due to this clinical change, we do send her back to CT for dissection study that does not show any novel features when compared to the PE study.  She is continued on heparin for her NSTEMI and possible PE.  I consulted medicine who agrees to admit.  Moved vital signs with single dose of labetalol and morphine.  Clinical Course as of 07/10/22 0418  Nancy Fetter Jul 10, 2022  0055 Updated by the nurse of patient's change in vital signs.  More tachycardic and hypertensive.  I reevaluate the patient shortly after this and she is restless in bed and daughter at the bedside indicates that she has become increasingly restless over this past hour or so alongside the vital sign changes.  There has been some technical difficulties and delays with reading the CTA chest PE study.  Apparently the CT department is ironing this out and we should have a read from radiology soon.  But with her clinical change I am also concerned about the possibility of dissection and would not want her heparinized that this was the case so we will go back to CT for another angiogram with a dissection study as well.  We will provide morphine and labetalol empirically [DS]    Clinical Course User Index [DS] Vladimir Crofts, MD    .Critical Care  Performed by: Vladimir Crofts, MD Authorized by: Vladimir Crofts, MD   Critical care provider statement:    Critical care time (minutes):  30   Critical care time was exclusive of:  Separately billable procedures and treating other patients   Critical care was  necessary to treat or prevent imminent or life-threatening deterioration of the following conditions:  Cardiac failure and circulatory failure   Critical care was time spent personally by me on the following activities:  Development of treatment plan with patient or surrogate, discussions with consultants, evaluation of patient's response to treatment, examination of patient, ordering and review of laboratory studies, ordering and review of radiographic studies, ordering and performing treatments and interventions, pulse oximetry, re-evaluation of patient's condition and review of old charts      Vladimir Crofts, MD 07/10/22 [phone]

## 2022-07-10 NOTE — H&P (Addendum)
[place]   PATIENT NAME: Natalie Adams    MR#:  [id number]  DATE OF BIRTH:  15-Sep-1929  DATE OF ADMISSION:  07/09/2022  PRIMARY CARE PHYSICIAN: Pcp, No   Patient is coming from: Home  REQUESTING/REFERRING PHYSICIAN: Vladimir Crofts, MD  CHIEF COMPLAINT:   Chief Complaint  Patient presents with   Shortness of Breath    HISTORY OF PRESENT ILLNESS:  Natalie Adams is a 89 y.o. African-American female with medical history significant for type diabetes mellitus, hypertension, asthma and osteoarthritis who presented to the emergency room with acute onset of dyspnea and altered mental status with confusion after having a bowel movement today.  She was hypothermic.  She was not communicative as her usual .  She did not have any reported chest pain.  No fever or chills.  No nausea or vomiting or abdominal pain.  No bleeding diathesis.  No dysuria, oliguria or hematuria or flank pain.  She has mild lower extremity edema without worsening.  No orthopnea or paroxysmal nocturnal dyspnea or dyspnea on exertion recently.  ED Course: When the patient came to the ER, heart rate was 108 and respiratory rate 41 with temperature 96.7 with pulse symmetry of 96% on room air.  Labs revealed VBG with pH 7.39, HCO3 32.1 and pCO2 53 and pO2 of 46.  CMP revealed a potassium of 5.9 with blood glucose of 170, albumin of 3 and total bili of 1.3 with AST 113 and ALT 75 above normal previous levels.  High-sensitivity troponin I was 641 and later 729.  BNP was 2688.1.  Lactic acid was 1.7 and later 1.5.  CBC showed anemia with hemoglobin 10.7 hematocrit 36.1 close to previous levels.  D-dimer was ordered and it came back elevated 1.85 PT 14.2 and INR 1.1 with PTT 23.  EKG as reviewed by me : Sinus tachycardia with a rate of 109 with biatrial enlargement, LVH and Q waves inferiorly. Imaging: Portable chest x-ray showed questionable bilateral mid to lower lung zones patchy airspace interstitial opacities with likely  at least trace to small volume pleural effusions.  Chest CTA showed the following: 1. No central pulmonary embolism. The bilateral lower lobe segmental and subsegmental pulmonary arteries are not well visualized due to respiratory motion. Possible filling defects in the right lower lobe lateral and posterior segmental arteries are favored due to motion however acute or chronic thromboembolism is difficult to exclude. 2. Cardiomegaly with evidence of pulmonary edema and small right-greater-than-left pleural effusions. 3. Diffuse bronchial wall thickening and peripheral centrilobular micro nodularity suggestive of airway inflammation/infection. 4. Dilated main pulmonary artery which can be seen with pulmonary hypertension. 5. Mediastinal lymphadenopathy measuring up to 1.2 cm in short axis is favored to be reactive. 6. Right upper lobe pulmonary nodule measuring 8 mm. Non-contrast chest CT at 6-12 months is recommended. If the nodule is stable at time of repeat CT, then future CT at 18-24 months (from today's scan) is considered optional for low-risk patients, but is recommended for high-risk patients. This recommendation follows the consensus statement: Guidelines for Management of Incidental Pulmonary Nodules Detected on CT Images: From the Fleischner Society 2017; Radiology 2017; 284:228-243.   Aortic Atherosclerosis.  CTA aortic dissection revealed: 1. Again noted age-indeterminate subsegmental right lower lobe arterial emboli. No large-vessel or further arterial embolus is seen. Prominent pulmonary trunk as before. 2. Cardiomegaly with mild interstitial edema, moderate right and small left pleural effusions and diffuse bronchial thickening. 3. Aortic and coronary artery atherosclerosis. 4. Aortic  valve leaflet calcifications which may be associated with stenosis or insufficiency. 5. Contrast reflux into the IVC and hepatic veins consistent with elevated right heart pressures.  No increased RV/LV ratio. 6. 75% calcific origin stenosis of the left renal artery. 7. 3.2 cm thin-walled right ovarian cyst. Recommend follow-up pelvic ultrasound in 6-12 months. Reference: JACR 2020 Feb;17(2):248-254 8. Increased bilateral hydroureteronephrosis without visible ureteral filling defect. 9. Thickened trabeculated bladder with numerous small diverticula. 10. Cholelithiasis. 11. Trace deep pelvic ascites. 12. Osteopenia and degenerative change with multiple mild mid to lower thoracic spine compression fracture deformities. 13. 8 mm right upper lobe nodule. Non-contrast chest CT at 6-12 months is recommended. If the nodule is stable at time of repeat CT, then future CT at 18-24 months (from today's scan) is considered optional for low-risk patients, but is recommended for high-risk patients. This recommendation follows the consensus statement: Guidelines for Management of Incidental Pulmonary Nodules Detected on CT Images: From the Fleischner Society 2017; Radiology 2017; 284:228-243.   Aortic Atherosclerosis.  The patient was given 4 mg of IV morphine sulfate, 20 mg of IV labetalol, 1 L bolus of IV lactated Ringer and 40 mg of IV Lasix was IV heparin bolus and drip.  She will be admitted to a stepdown unit bed for further evaluation and management.  PAST MEDICAL HISTORY:   Past Medical History:  Diagnosis Date   Arthritis    Asthma    Diabetes mellitus without complication ([place])    Hypertension    Morbid obesity ([place])     PAST SURGICAL HISTORY:   Past Surgical History:  Procedure Laterality Date   CATARACT EXTRACTION, BILATERAL     ORIF FEMUR FRACTURE Left 06/05/2020   Procedure: OPEN REDUCTION INTERNAL FIXATION (ORIF) DISTAL FEMUR FRACTURE;  Surgeon: Corky Mull, MD;  Location: [facility] ORS;  Service: Orthopedics;  Laterality: Left;    SOCIAL HISTORY:   Social History   Tobacco Use   Smoking status: Never   Smokeless tobacco: Never  Substance Use Topics    Alcohol use: Not on file    FAMILY HISTORY:   Family History  Problem Relation Age of Onset   Hypertension Mother     DRUG ALLERGIES:   Allergies  Allergen Reactions   Amoxicillin-Pot Clavulanate Diarrhea   Hydrochlorothiazide Other (See Comments)    "she got shaky and fell" "she got shaky and fell"     REVIEW OF SYSTEMS:   ROS As per history of present illness. All pertinent systems were reviewed above. Constitutional, HEENT, cardiovascular, respiratory, GI, GU, musculoskeletal, neuro, psychiatric, endocrine, integumentary and hematologic systems were reviewed and are otherwise negative/unremarkable except for positive findings mentioned above in the HPI.   MEDICATIONS AT HOME:   Prior to Admission medications   Medication Sig Start Date End Date Taking? Authorizing Provider  amLODipine (NORVASC) 10 MG tablet Take 5 mg by mouth daily. 05/01/17  Yes [provider]  ascorbic acid (VITAMIN C) 1000 MG tablet Take 1,000 mg by mouth daily.   Yes [provider]  Boswellia-Glucosamine-Vit D (OSTEO BI-FLEX ONE PER DAY PO) Take 1 capsule by mouth daily.   Yes [provider]  Cranberry 300 MG tablet Take 300 mg by mouth 2 (two) times daily.   Yes [provider]  Ferrous Fumarate (IRON) 18 MG TBCR Take 1 tablet (18 mg total) by mouth daily. Can take any over-the-counter iron supplement. 06/09/20  Yes Enzo Bi, MD  insulin lispro (HUMALOG) 100 UNIT/ML injection Sliding scale insulin # of  units = (BS-150/50) TID [facility] 05/09/16  Yes [provider]  insulin NPH Human (NOVOLIN N) 100 UNIT/ML injection Inject 8-14 Units into the skin 2 (two) times daily before a meal. 14 units qam 8 units qpm 05/09/16  Yes [provider]  ipratropium-albuterol (DUONEB) 0.5-2.5 (3) MG/3ML SOLN SMARTSIG:1 Ampule(s) Via Nebulizer Every 6 Hours PRN 03/25/20  Yes [provider]  metoprolol tartrate (LOPRESSOR) 25 MG tablet Take 12.5 mg by mouth 2  (two) times daily. 05/08/20  Yes [provider]  Multiple Vitamins tablet Take by mouth. 02/11/11  Yes [provider]  Omega-3 Fatty Acids (FISH OIL) 1200 MG CAPS Take by mouth.   Yes [provider]  pravastatin (PRAVACHOL) 40 MG tablet Take 40 mg by mouth at bedtime. 05/08/20  Yes [provider]  acetaminophen (TYLENOL) 500 MG tablet Take 2 tablets (1,000 mg total) by mouth 2 (two) times daily as needed for mild pain or moderate pain. 06/09/20   Enzo Bi, MD  Dietary Management Product (ALGESIS) TABS Take by mouth. Patient not taking: Reported on 07/09/2022    [provider]  polyethylene glycol (MIRALAX / GLYCOLAX) 17 g packet Take 17 g by mouth daily. 12/16/21   Loletha Grayer, MD      VITAL SIGNS:  Blood pressure (!) 168/96, pulse 100, temperature 98 F (36.7 C), temperature source Axillary, resp. rate 14, height '5\' 7"'$  (1.702 m), weight 72.6 kg, SpO2 100 %.  PHYSICAL EXAMINATION:  Physical Exam  GENERAL:  89 y.o.-year-old female patient lying in the bed with mild respiratory distress with conversational dyspnea. EYES: Pupils equal, round, reactive to light and accommodation. No scleral icterus. Extraocular muscles intact.  HEENT: Head atraumatic, normocephalic. Oropharynx and nasopharynx clear.  NECK:  Supple, no jugular venous distention. No thyroid enlargement, no tenderness.  LUNGS: Diminished bibasilar breath sounds with bibasilar rales.  No use of accessory muscles of respiration.  CARDIOVASCULAR: Regular rate and rhythm, S1, S2 normal. No murmurs, rubs, or gallops.  ABDOMEN: Soft, nondistended, nontender. Bowel sounds present. No organomegaly or mass.  EXTREMITIES: 1+ bilateral lower extremity pitting edema with no cyanosis, or clubbing.  NEUROLOGIC: Cranial nerves II through XII are intact. Muscle strength 5/5 in all extremities. Sensation intact. Gait not checked.  PSYCHIATRIC: The patient is alert and oriented x 3.  Normal affect and  good eye contact. SKIN: No obvious rash, lesion, or ulcer.   LABORATORY PANEL:   CBC Recent Labs  Lab 07/09/22 2208  WBC 7.8  HGB 10.7*  HCT 36.1  PLT 223   ------------------------------------------------------------------------------------------------------------------  Chemistries  Recent Labs  Lab 07/09/22 2208  NA 136  K 5.9*  CL 104  CO2 24  GLUCOSE 170*  BUN 23  CREATININE 0.90  CALCIUM 8.9  AST 113*  ALT 75*  ALKPHOS 110  BILITOT 1.3*   ------------------------------------------------------------------------------------------------------------------  Cardiac Enzymes No results for input(s): "TROPONINI" in the last 168 hours. ------------------------------------------------------------------------------------------------------------------  RADIOLOGY:  CT Angio Chest/Abd/Pel for Dissection W and/or Wo Contrast  Result Date: 07/10/2022 CLINICAL DATA:  Tachycardia, hypertension and elevated troponins. Shortness of breath also. EXAM: CT ANGIOGRAPHY CHEST, ABDOMEN AND PELVIS TECHNIQUE: Non-contrast CT of the chest was initially obtained. Multidetector CT imaging through the chest, abdomen and pelvis was performed using the standard protocol during bolus administration of intravenous contrast. Multiplanar reconstructed images and MIPs were obtained and reviewed to evaluate the vascular anatomy. RADIATION DOSE REDUCTION: This exam was performed according to the departmental dose-optimization program which includes automated exposure control, adjustment of  the mA and/or kV according to patient size and/or use of iterative reconstruction technique. CONTRAST:  1m OMNIPAQUE IOHEXOL 350 MG/ML SOLN COMPARISON:  CTA chest yesterday at 11:57 p.m., CT abdomen and pelvis with IV contrast dated 12/11/2021. FINDINGS: CTA CHEST FINDINGS Cardiovascular: Again noted are nonoccluding age-indeterminate subsegmental right lower lobe arterial emboli with involvement of 2 vessels on 6: 88 and  89 and a single small-vessel on 6:94 and 95, also seen on coronal reconstruction series 9, image 83. No large vessel or further arterial embolus is seen. The pulmonary trunk again measuring 3.5 cm indicating arterial hypertension with IVC and hepatic vein contrast reflux consistent with elevated right heart pressures. There is no increased RV/LV ratio. Moderate panchamber cardiomegaly continues to be seen with three-vessel coronary artery calcifications and no pericardial effusion. There are mildly prominent superior pulmonary veins. There is moderate patchy atherosclerosis in the aorta, heavy calcification along the valve leaflets, moderate calcification left subclavian artery. There is no aneurysm, stenosis or dissection. Mediastinum/Nodes: There are mildly enlarged multifocal mediastinal lymph nodes and a few mildly prominent hilar nodes but none greater than 1.2 cm in short axis Lungs/Pleura: Respiratory motion limits evaluation of the lungs. There is mild interstitial edema, moderate right and small layering left pleural effusions and again noted and diffuse bronchial thickening. 8 mm right upper lobe nodule is again noted on 7:53. There is posterior atelectasis. Scattered linear scar-like opacities. No focal pneumonia is evident. No pneumothorax. Musculoskeletal: Multiple mild mid to lower thoracic spine compression fracture deformities. Osteopenia and degenerative change. The ribcage is intact. Review of the MIP images confirms the above findings. CTA ABDOMEN AND PELVIS FINDINGS VASCULAR Aorta: Moderately calcified without stenosis, aneurysm or dissection mildly tortuous. Celiac: There are ostial wall calcifications but no stenosis or branch occlusion. SMA: Normal. Renals: Both are single. The left renal artery demonstrates a 75% calcific origin stenosis and is otherwise clear. On the right there are ostial calcifications but no stenosis. IMA: Normal. Inflow: There are mild-to-moderate patchy calcifications  without flow-limiting stenoses Veins: Patent suprarenal IVC. The other veins are unopacified and not evaluated. Review of the MIP images confirms the above findings. NON-VASCULAR Hepatobiliary: There is a 1.7 cm densely calcified stone in the gallbladder. No wall thickening or biliary dilatation. No focal abnormality of the liver. Pancreas: No abnormality. Spleen: No abnormality. Adrenals/Urinary Tract: No adrenal or renal mass. There is increased bilateral mild hydroureteronephrosis without a visible ureteral filling defect. There is contrast in the collecting systems which would obscure stones if present. The bladder is increased in distention up to the level of L5. There is thickening and trabeculation of the bladder and numerous small bladder diverticula. No masslike wall thickening. Stomach/Bowel: No dilatation or wall thickening including the appendix. Moderate stool retention ascending colon. Uncomplicated scattered diverticulosis. Lymphatic: No adenopathy is seen. Reproductive: Unremarkable uterus and left ovary. There is homogeneous thin-walled right ovarian cyst measuring 3.2 cm, with Hounsfield density of 16. Other: Trace deep pelvic ascites. No free hemorrhage, free air or incarcerated hernias. Small umbilical fat hernia. Musculoskeletal: Osteopenia and degenerative change lumbar spine. Mild-to-moderate hip DJD. No acute or other significant osseous findings. Review of the MIP images confirms the above findings. IMPRESSION: 1. Again noted age-indeterminate subsegmental right lower lobe arterial emboli. No large-vessel or further arterial embolus is seen. Prominent pulmonary trunk as before. 2. Cardiomegaly with mild interstitial edema, moderate right and small left pleural effusions and diffuse bronchial thickening. 3. Aortic and coronary artery atherosclerosis. 4. Aortic valve leaflet calcifications which may  be associated with stenosis or insufficiency. 5. Contrast reflux into the IVC and hepatic veins  consistent with elevated right heart pressures. No increased RV/LV ratio. 6. 75% calcific origin stenosis of the left renal artery. 7. 3.2 cm thin-walled right ovarian cyst. Recommend follow-up pelvic ultrasound in 6-12 months. Reference: JACR 2020 Feb;17(2):248-254 8. Increased bilateral hydroureteronephrosis without visible ureteral filling defect. 9. Thickened trabeculated bladder with numerous small diverticula. 10. Cholelithiasis. 11. Trace deep pelvic ascites. 12. Osteopenia and degenerative change with multiple mild mid to lower thoracic spine compression fracture deformities. 13. 8 mm right upper lobe nodule. Non-contrast chest CT at 6-12 months is recommended. If the nodule is stable at time of repeat CT, then future CT at 18-24 months (from today's scan) is considered optional for low-risk patients, but is recommended for high-risk patients. This recommendation follows the consensus statement: Guidelines for Management of Incidental Pulmonary Nodules Detected on CT Images: From the Fleischner Society 2017; Radiology 2017; 284:228-243. Aortic Atherosclerosis (ICD10-I70.0). Electronically Signed   By: Telford Nab M.D.   On: 07/10/2022 03:32   CT Angio Chest PE W and/or Wo Contrast  Result Date: 07/10/2022 CLINICAL DATA:  Shortness of breath.  PE suspected EXAM: CT ANGIOGRAPHY CHEST WITH CONTRAST TECHNIQUE: Multidetector CT imaging of the chest was performed using the standard protocol during bolus administration of intravenous contrast. Multiplanar CT image reconstructions and MIPs were obtained to evaluate the vascular anatomy. RADIATION DOSE REDUCTION: This exam was performed according to the departmental dose-optimization program which includes automated exposure control, adjustment of the mA and/or kV according to patient size and/or use of iterative reconstruction technique. CONTRAST:  81m OMNIPAQUE IOHEXOL 350 MG/ML SOLN COMPARISON:  Radiographs 07/09/2022 FINDINGS: Cardiovascular: The central  pulmonary arteries are well opacified and demonstrate no filling defects to suggest pulmonary embolism. The lower lung segmental and subsegmental pulmonary arteries are not well evaluated due to respiratory motion. Possible filling defects in the right lower lobe pulmonary arteries are favored artifactual secondary to motion and if real are favored to represent eccentric chronic thrombus rather than acute embolism. (For example circa series 6/image 231 in the posterior right lower lobe and lateral pulmonary arteries). Cardiomegaly. Reflux of contrast into the hepatic veins compatible with elevated right heart pressures. Dilated main pulmonary artery measuring 35 mm in diameter which can be seen with pulmonary hypertension. Coronary artery and aortic atherosclerotic calcification. Aortic valve calcification. Mediastinum/Nodes: Mediastinal lymphadenopathy measuring up to 1.2 cm in short axis (precarinal node series 6/image 129). Unremarkable esophagus. Lungs/Pleura: Diffuse patchy ground-glass opacity and interlobular septal thickening compatible with pulmonary edema. Diffuse bronchial wall thickening and peripheral predominant centrilobular micronodularity. Small right-greater-than-left pleural effusions. 8 mm pulmonary nodule in the right upper lobe laterally (5/45). Bronchiectasis/bronchiolectasis in the bilateral lower lobes and right middle lobe is well as the lingula. Upper Abdomen: No acute abnormality. Musculoskeletal: Compression deformities of the superior endplate of multiple mid and lower thoracic vertebral bodies is favored chronic. No evidence of acute fracture. Review of the MIP images confirms the above findings. IMPRESSION: 1. No central pulmonary embolism. The bilateral lower lobe segmental and subsegmental pulmonary arteries are not well visualized due to respiratory motion. Possible filling defects in the right lower lobe lateral and posterior segmental arteries are favored due to motion however  acute or chronic thromboembolism is difficult to exclude. 2. Cardiomegaly with evidence of pulmonary edema and small right-greater-than-left pleural effusions. 3. Diffuse bronchial wall thickening and peripheral centrilobular micro nodularity suggestive of airway inflammation/infection. 4. Dilated main pulmonary artery which can  be seen with pulmonary hypertension. 5. Mediastinal lymphadenopathy measuring up to 1.2 cm in short axis is favored to be reactive. 6. Right upper lobe pulmonary nodule measuring 8 mm. Non-contrast chest CT at 6-12 months is recommended. If the nodule is stable at time of repeat CT, then future CT at 18-24 months (from today's scan) is considered optional for low-risk patients, but is recommended for high-risk patients. This recommendation follows the consensus statement: Guidelines for Management of Incidental Pulmonary Nodules Detected on CT Images: From the Fleischner Society 2017; Radiology 2017; 284:228-243. Aortic Atherosclerosis (ICD10-I70.0). Electronically Signed   By: Placido Sou M.D.   On: 07/10/2022 01:22   DG Chest Port 1 View  Result Date: 07/09/2022 CLINICAL DATA:  [id number] [facility]) [id number] EXAM: PORTABLE CHEST 1 VIEW COMPARISON:  Chest x-ray 12/11/2021, chest x-ray 10/09/2020 FINDINGS: The heart and mediastinal contours are unchanged. Aortic calcification. Question bilateral mid to lower lung zones patchy airspace interstitial opacities with likely at least trace to small volume pleural effusions. No pneumothorax. No acute osseous abnormality. IMPRESSION: Question bilateral mid to lower lung zones patchy airspace interstitial opacities with likely at least trace to small volume pleural effusions. Recommend CT chest with intravenous contrast for further evaluation. Electronically Signed   By: Iven Finn M.D.   On: 07/09/2022 23:19   CT Head Wo Contrast  Result Date: 07/09/2022 CLINICAL DATA:  Mental status change, unknown cause EXAM: CT HEAD WITHOUT CONTRAST  TECHNIQUE: Contiguous axial images were obtained from the base of the skull through the vertex without intravenous contrast. RADIATION DOSE REDUCTION: This exam was performed according to the departmental dose-optimization program which includes automated exposure control, adjustment of the mA and/or kV according to patient size and/or use of iterative reconstruction technique. COMPARISON:  CT head 12/11/2021 FINDINGS: Brain: Cerebral ventricle sizes are concordant with the degree of cerebral volume loss. Patchy and confluent areas of decreased attenuation are noted throughout the deep and periventricular white matter of the cerebral hemispheres bilaterally, compatible with chronic microvascular ischemic disease. Similar-appearing cystic lesion in the anterior left frontal lobe. No evidence of large-territorial acute infarction. No parenchymal hemorrhage. No mass lesion. No extra-axial collection. No mass effect or midline shift. No hydrocephalus. Basilar cisterns are patent. Vascular: No hyperdense vessel. Skull: No acute fracture or focal lesion. Question left temporomandibular joint dislocation. Degenerative changes of the right temporomandibular joint. Sinuses/Orbits: Sphenoid wall hypertrophy on the right suggestive of chronic sinusitis. Paranasal sinuses and mastoid air cells are clear. Bilateral lens replacement. Otherwise the orbits are unremarkable. Other: None. IMPRESSION: 1. No acute intracranial abnormality. 2. Question left temporomandibular joint dislocation. Correlate with physical exam. Electronically Signed   By: Iven Finn M.D.   On: 07/09/2022 23:17      IMPRESSION AND PLAN:  Assessment and Plan: * NSTEMI (non-ST elevated myocardial infarction) ([place]) - The patient be admitted to a stepdown unit bed. - We will continue him on IV heparin. - We will continue high-dose statin therapy. - She will be placed on aspirin as well as as needed sublingual nitroglycerin and IV morphine  sulfate. - High-dose statin will be given and fasting lipids will be obtained. - 2D echo and cardiology consult to be obtained. - I notified Dr. Caryl Comes about the patient.  Elevated d-dimer - Chest CTA suspicious but nonconclusive for PE. - We will obtain a VQ scan. - The patient will be on IV heparin as mentioned above. - Will obtain bilateral lower extremity venous Doppler to rule out DVT. - 2D echo  will assess for RV strain.  Acute pulmonary edema ([facility]) - This could be secondary to non-STEMI. - It could be acute cor pulmonale in the setting of acute PE as well. - The patient will be diuresed with IV Lasix. - 2D echo and cardiology consult will be obtained as mentioned above.  Hyperkalemia - We will give her D50 insulin as well as IV calcium gluconate. - We will follow potassium level.  Type 2 diabetes mellitus without complication, with long-term current use of insulin ([place]) - The patient will be placed on supplement coverage with NovoLog. - We will continue her basal coverage.  Essential hypertension - We will continue her antihypertensives.    DVT prophylaxis: IV heparin. Advanced Care Planning:  Code Status: The patient is DNR. Family Communication:  The plan of care was discussed in details with the patient (and family). I answered all questions. The patient agreed to proceed with the above mentioned plan. Further management will depend upon hospital course. Disposition Plan: Back to previous home environment Consults called: none.  All the records are reviewed and case discussed with ED provider.  Status is: Inpatient  At the time of the admission, it appears that the appropriate admission status for this patient is inpatient.  This is judged to be reasonable and necessary in order to provide the required intensity of service to ensure the patient's safety given the presenting symptoms, physical exam findings and initial radiographic and laboratory data in the context of  comorbid conditions.  The patient requires inpatient status due to high intensity of service, high risk of further deterioration and high frequency of surveillance required.  I certify that at the time of admission, it is my clinical judgment that the patient will require inpatient hospital care extending more than 2 midnights.                            Dispo: The patient is from: Home              Anticipated d/c is to: Home              Patient currently is not medically stable to d/c.              Difficult to place patient: No  Christel Mormon M.D on 07/10/2022 at 5:28 AM  [facility]   From 7 PM-7 AM, contact night-coverage [email or web address]  CC: Primary care physician; Pcp, No

## 2022-07-10 NOTE — ED Notes (Signed)
Admitting physician at bedside

## 2022-07-11 ENCOUNTER — Inpatient Hospital Stay: Payer: Medicaid Other

## 2022-07-11 DIAGNOSIS — I214 Non-ST elevation (NSTEMI) myocardial infarction: Secondary | ICD-10-CM | POA: Diagnosis not present

## 2022-07-11 LAB — GLUCOSE, CAPILLARY
Glucose-Capillary: 125 mg/dL — ABNORMAL HIGH (ref 70–99)
Glucose-Capillary: 126 mg/dL — ABNORMAL HIGH (ref 70–99)
Glucose-Capillary: 190 mg/dL — ABNORMAL HIGH (ref 70–99)
Glucose-Capillary: 196 mg/dL — ABNORMAL HIGH (ref 70–99)
Glucose-Capillary: 216 mg/dL — ABNORMAL HIGH (ref 70–99)
Glucose-Capillary: 279 mg/dL — ABNORMAL HIGH (ref 70–99)
Glucose-Capillary: 51 mg/dL — ABNORMAL LOW (ref 70–99)
Glucose-Capillary: 96 mg/dL (ref 70–99)

## 2022-07-11 LAB — BASIC METABOLIC PANEL
Anion gap: 9 (ref 5–15)
BUN: 26 mg/dL — ABNORMAL HIGH (ref 8–23)
CO2: 29 mmol/L (ref 22–32)
Calcium: 8.9 mg/dL (ref 8.9–10.3)
Chloride: 100 mmol/L (ref 98–111)
Creatinine, Ser: 1.22 mg/dL — ABNORMAL HIGH (ref 0.44–1.00)
GFR, Estimated: 42 mL/min — ABNORMAL LOW (ref >60)
Glucose, Bld: 184 mg/dL — ABNORMAL HIGH (ref 70–99)
Potassium: 3.7 mmol/L (ref 3.5–5.1)
Sodium: 138 mmol/L (ref 135–145)

## 2022-07-11 LAB — CBC
HCT: 36.5 % (ref 36.0–46.0)
Hemoglobin: 11 g/dL — ABNORMAL LOW (ref 12.0–15.0)
MCH: 20 pg — ABNORMAL LOW (ref 26.0–34.0)
MCHC: 30.1 g/dL (ref 30.0–36.0)
MCV: 66.5 fL — ABNORMAL LOW (ref 80.0–100.0)
Platelets: 233 10*3/uL (ref 150–400)
RBC: 5.49 MIL/uL — ABNORMAL HIGH (ref 3.87–5.11)
RDW: 17.4 % — ABNORMAL HIGH (ref 11.5–15.5)
WBC: 8.3 10*3/uL (ref 4.0–10.5)
nRBC: 0.4 % — ABNORMAL HIGH (ref 0.0–0.2)

## 2022-07-11 LAB — MAGNESIUM: Magnesium: 1.7 mg/dL (ref 1.7–2.4)

## 2022-07-11 LAB — HEMOGLOBIN A1C
Hgb A1c MFr Bld: 8.2 % — ABNORMAL HIGH (ref 4.8–5.6)
Mean Plasma Glucose: 189 mg/dL

## 2022-07-11 LAB — PHOSPHORUS: Phosphorus: 4.1 mg/dL (ref 2.5–4.6)

## 2022-07-11 LAB — HEPARIN LEVEL (UNFRACTIONATED)
Heparin Unfractionated: 0.54 IU/mL (ref 0.30–0.70)
Heparin Unfractionated: 0.3 IU/mL (ref 0.30–0.70)

## 2022-07-11 MED ORDER — DEXTROSE 10 % IV SOLN: INTRAVENOUS | Status: DC

## 2022-07-11 MED ORDER — FUROSEMIDE 10 MG/ML IJ SOLN
20.0000 mg | Freq: Two times a day (BID) | INTRAMUSCULAR | Status: DC
  Administered 2022-07-11 – 2022-07-12 (×2): 20 mg via INTRAVENOUS
  Filled 2022-07-11 (×2): qty 2

## 2022-07-11 MED ORDER — DEXTROSE 50 % IV SOLN
25.0000 g | INTRAVENOUS | Status: AC
  Administered 2022-07-11: 25 g via INTRAVENOUS

## 2022-07-11 NOTE — Progress Notes (Signed)
[place] for heparin infusion Indication: ACS/STEMI, PE  Allergies  Allergen Reactions   Amoxicillin-Pot Clavulanate Diarrhea   Hydrochlorothiazide Other (See Comments)    "she got shaky and fell" "she got shaky and fell"     Patient Measurements: Height: '5\' 7"'$  (170.2 cm) Weight: 71.7 kg (158 lb 1.1 oz) IBW/kg (Calculated) : 61.6 Heparin Dosing Weight: 72.6 kg  Vital Signs: Temp: 99.7 F (37.6 C) (03/11 1800) BP: 122/64 (03/11 1800) Pulse Rate: 85 (03/11 1800)  Labs: Recent Labs    07/09/22 2208 07/10/22 0021 07/10/22 0520 07/10/22 1104 07/10/22 1638 07/10/22 1800 07/10/22 2029 07/10/22 2205 07/11/22 0943 07/11/22 1757  HGB 10.7*  --  11.3*  --   --   --   --   --  11.0*  --   HCT 36.1  --  38.3  --   --   --   --   --  36.5  --   PLT 223  --  179  --   --   --   --   --  233  --   APTT  --  23*  --   --   --   --   --   --   --   --   LABPROT  --  14.2 14.9  --   --   --   --   --   --   --   INR  --  1.1 1.2  --   --   --   --   --   --   --   HEPARINUNFRC  --   --   --    < >  --  0.89*  --  1.04* 0.54 0.30  CREATININE 0.90  --  0.88  --   --   --   --   --  1.22*  --   TROPONINIHS 641* 729*  --    < > 727* 670* 564*  --   --   --    < > = values in this interval not displayed.     Estimated Creatinine Clearance: 29.8 mL/min (A) (by C-G formula based on SCr of 1.22 mg/dL (H)).   Medical History: Past Medical History:  Diagnosis Date   Arthritis    Asthma    Diabetes mellitus without complication ([place])    Hypertension    Morbid obesity ([place])     Assessment: Pt is a 89 yo female presenting to ED c/o SOB, found with elevated Troponin I level and right lower lobe arterial emboli  -No anticoagulants PTA per med rec   Date Time HL Rate/Comment 3/10 1104  >1.10,  supratherapeutic, hold x 1 hr and resume at 800 units/hr 3/10 1800 0.89 supratherapeutic 3/10 2205 1.04 supratherapeutic 3/11 0943 0.54 therapeutic x  1 3/11 1757 0.30 therapeutic x2 / 650 u/hr  Goal of Therapy:  Heparin level 0.3-0.7 units/ml Monitor platelets by anticoagulation protocol: Yes  Plan:  Continue heparin infusion at 650 units/hr Check anti-Xa level daily with AM labs Continue to monitor H&H and platelets daily while on heparin gtt.  [place] Pharmacist 07/11/2022 7:54 PM

## 2022-07-11 NOTE — Evaluation (Signed)
Occupational Therapy Evaluation Patient Details Name: Natalie Adams MRN: [id number] DOB: 1929/12/08 Today's Date: 07/11/2022   History of Present Illness Natalie Adams is a 89 y.o. African-American female with medical history significant for type diabetes mellitus, hypertension, asthma and osteoarthritis who presented to the emergency room with acute onset of dyspnea and altered mental status with confusion after having a bowel movement. Found to be hypothermic and evaluated for NSTEMI   Clinical Impression   Natalie Adams was seen for OT evaluation this date. Prior to hospital admission, pt was w/c bound. Pt lives with family available 24/7. Pt presents to acute OT demonstrating impaired ADL performance and functional mobility 2/2 decreased activity tolerance and functional strength/balance deficits. Pt currently requires TOTAL A rolling bed level. MAX A sup>sit, MAX A x2 sit>sup. Posterior lean, poor sitting tolerance. Pt would benefit from skilled OT to address noted impairments. Upon hospital discharge, recommend HHOT to maximize pt safety and return to PLOF     Recommendations for follow up therapy are one component of a multi-disciplinary discharge planning process, led by the attending physician.  Recommendations may be updated based on patient status, additional functional criteria and insurance authorization.   Follow Up Recommendations  Home health OT     Assistance Recommended at Discharge Frequent or constant Supervision/Assistance  Patient can return home with the following Two people to help with walking and/or transfers;Two people to help with bathing/dressing/bathroom;Help with stairs or ramp for entrance    Functional Status Assessment  Patient has had a recent decline in their functional status and demonstrates the ability to make significant improvements in function in a reasonable and predictable amount of time.  Equipment Recommendations  None recommended by OT     Recommendations for Other Services       Precautions / Restrictions Precautions Precautions: Fall Restrictions Weight Bearing Restrictions: No      Mobility Bed Mobility Overal bed mobility: Needs Assistance Bed Mobility: Rolling, Supine to Sit, Sit to Supine Rolling: +2 for physical assistance, Total assist   Supine to sit: Max assist Sit to supine: Max assist, +2 for physical assistance        Transfers                   General transfer comment: unsafe to attempt poor sitting balance/tolerance      Balance Overall balance assessment: Needs assistance Sitting-balance support: Bilateral upper extremity supported, Feet supported Sitting balance-Leahy Scale: Poor   Postural control: Posterior lean                                 ADL either performed or assessed with clinical judgement   ADL Overall ADL's : Needs assistance/impaired                                       General ADL Comments: TOTAL A rolling bed level for simulated toileting. MAX A self-drinking seated EOB.      Pertinent Vitals/Pain Pain Assessment Pain Assessment: No/denies pain     Hand Dominance     Extremity/Trunk Assessment Upper Extremity Assessment Upper Extremity Assessment: Generalized weakness   Lower Extremity Assessment Lower Extremity Assessment: Generalized weakness       Communication Communication Communication: HOH   Cognition Arousal/Alertness: Awake/alert Behavior During Therapy: Anxious Overall Cognitive Status: Impaired/Different from baseline Area of Impairment: Following  commands                       Following Commands: Follows one step commands inconsistently                        Home Living Family/patient expects to be discharged to:: Private residence Living Arrangements: Children Available Help at Discharge: Family;Available 24 hours/day Type of Home: House Home Access: Ramped entrance                      Home Equipment: Wheelchair - manual;BSC/3in1 (bed rails)          Prior Functioning/Environment Prior Level of Function : Needs assist             Mobility Comments: Dependent for w/c or BSC t/f, PRN hoyer lift. ADLs Comments: SETUP feeding. assist for ADLs        OT Problem List: Decreased strength;Impaired balance (sitting and/or standing);Decreased activity tolerance      OT Treatment/Interventions: Self-care/ADL training;Therapeutic exercise;Energy conservation;DME and/or AE instruction;Therapeutic activities;Balance training;Patient/family education    OT Goals(Current goals can be found in the care plan section) Acute Rehab OT Goals Patient Stated Goal: to go home OT Goal Formulation: With family Time For Goal Achievement: 07/25/22 Potential to Achieve Goals: Fair ADL Goals Pt Will Perform Grooming: sitting;with min assist Pt Will Perform Lower Body Dressing: with mod assist;bed level Pt Will Transfer to Toilet: bedside commode;with max assist  OT Frequency: Min 1X/week    Co-evaluation              AM-PAC OT "6 Clicks" Daily Activity     Outcome Measure Help from another person eating meals?: A Lot Help from another person taking care of personal grooming?: A Lot Help from another person toileting, which includes using toliet, bedpan, or urinal?: Total Help from another person bathing (including washing, rinsing, drying)?: Total Help from another person to put on and taking off regular upper body clothing?: A Lot Help from another person to put on and taking off regular lower body clothing?: Total 6 Click Score: 9   End of Session    Activity Tolerance: Patient tolerated treatment well;Patient limited by fatigue Patient left: in bed;with call bell/phone within reach;with family/visitor present  OT Visit Diagnosis: Other abnormalities of gait and mobility (R26.89);Muscle weakness (generalized) (M62.81)                Time:  [id number] OT Time Calculation (min): 15 min Charges:  OT General Charges $OT Visit: 1 Visit OT Evaluation $OT Eval Low Complexity: 1 Low  Dessie Coma, M.S. OTR/L  07/11/22, 2:48 PM  ascom [phone]

## 2022-07-11 NOTE — Evaluation (Signed)
Clinical/Bedside Swallow Evaluation Patient Details  Name: Natalie Adams MRN: [id number] Date of Birth: 1929-09-28  Today's Date: 07/11/2022 Time: SLP Start Time (ACUTE ONLY): 0930 SLP Stop Time (ACUTE ONLY): 0955 SLP Time Calculation (min) (ACUTE ONLY): 25 min  Past Medical History:  Past Medical History:  Diagnosis Date   Arthritis    Asthma    Diabetes mellitus without complication ([place])    Hypertension    Morbid obesity ([place])    Past Surgical History:  Past Surgical History:  Procedure Laterality Date   CATARACT EXTRACTION, BILATERAL     ORIF FEMUR FRACTURE Left 06/05/2020   Procedure: OPEN REDUCTION INTERNAL FIXATION (ORIF) DISTAL FEMUR FRACTURE;  Surgeon: Corky Mull, MD;  Location: [facility] ORS;  Service: Orthopedics;  Laterality: Left;   HPI:  Per H&P, pt "is a 89 y.o. African-American female with medical history significant for type diabetes mellitus, hypertension, asthma and osteoarthritis who presented to the emergency room with acute onset of dyspnea and altered mental status with confusion after having a bowel movement today.  She was hypothermic.  She was not communicative as her usual .  She did not have any reported chest pain.  No fever or chills.  No nausea or vomiting or abdominal pain.  No bleeding diathesis.  No dysuria, oliguria or hematuria or flank pain.  She has mild lower extremity edema without worsening.  No orthopnea or paroxysmal nocturnal dyspnea or dyspnea on exertion recently."    Assessment / Plan / Recommendation  Clinical Impression   Pt sitting upright in bed w/ daughter present t/o eval. Pt alert, pleasant, but min resistent to po's. Daughter reported pt is particular about her food, which likely explains her resistance to po's given. Given encouragement, pt cooperated for eval. Daughter reported pt has not had any apparent swallowing difficulties immediately prior to or during this admission. Pt left sitting up in bed w/ call button in reach, bed alarm  set, and daughter present.  Pt on RA; afebrile; WBC WNL. OF NOTE: Pt w/ edentulous status and HOH.  Pt does NOT appear to present w/ s/s consistent w/ oropharyngeal dysphagia. Pt's oropharyngeal swallow appears functional for pt and at her baseline. Following General aspiration precautions at all po's can help ANY pt reduce risk of aspiration/aspiration pneumonia.  Administered trials of thin liquids, purees, and solids. Pt required full assistance for feeding. Daughter reported pt typically feeds herself at home unless she is fatigued or not eating enough, in which case daughter assists w/ feeding. Pt exhibited mildly prolonged oral phase w/ solid in setting of edentulous status. Otherwise, pt's oral phase c/b good bolus control, management, adequate mashing/gumming of solids, timely A-P bolus transit, and clear oral cavity post-po's. During pharyngeal phase, pt exhibited seemingly timely pharyngeal swallow and clear vocal quality post-po's. No s/s of aspiration noted such as coughing, throat clearing, nor wet vocal quality.  Pt/daughter educated on diet recommendation and General aspiration precautions. Pt/daughter appreciative and agreed.  Recommend Dys 3 diet (w/ minced meats) w/ thin liquids. Recommend full assist w/ feeding. Recommend meds whole in puree. Follow General aspiration precautions at all po's (setting upright, small sips/bites, eating slowly).  No acute skilled ST needs at this time. ST services will c/o. MD to reconsult if any new needs arise during admit. Pt/daughter/RN updated and agreed.  SLP Visit Diagnosis: Dysphagia, unspecified (R13.10)    Aspiration Risk  No limitations    Diet Recommendation   Recommend Dys 3 diet (w/ minced meats) w/ thin liquids. Recommend  full assist w/ feeding.   Medication Administration: Whole meds with puree    Other  Recommendations Oral Care Recommendations: Oral care before and after PO;Oral care BID    Recommendations for follow up  therapy are one component of a multi-disciplinary discharge planning process, led by the attending physician.  Recommendations may be updated based on patient status, additional functional criteria and insurance authorization.  Follow up Recommendations No SLP follow up      Assistance Recommended at Discharge  FULL  Functional Status Assessment Patient has had a recent decline in their functional status and demonstrates the ability to make significant improvements in function in a reasonable and predictable amount of time.  Frequency and Duration            Prognosis Prognosis for improved oropharyngeal function: Good      Swallow Study   General Date of Onset: 07/09/22 HPI: Per H&P, pt "is a 89 y.o. African-American female with medical history significant for type diabetes mellitus, hypertension, asthma and osteoarthritis who presented to the emergency room with acute onset of dyspnea and altered mental status with confusion after having a bowel movement today.  She was hypothermic.  She was not communicative as her usual .  She did not have any reported chest pain.  No fever or chills.  No nausea or vomiting or abdominal pain.  No bleeding diathesis.  No dysuria, oliguria or hematuria or flank pain.  She has mild lower extremity edema without worsening.  No orthopnea or paroxysmal nocturnal dyspnea or dyspnea on exertion recently." Type of Study: Bedside Swallow Evaluation Previous Swallow Assessment: 2022 and 8/23 (Pt put on NPO in 2023, upgraded to Dys 2 w/ thins upon  d/c.`) Diet Prior to this Study: Thin liquids (Level 0);Regular Temperature Spikes Noted: Yes (WBC 8.3) Respiratory Status: Room air History of Recent Intubation: No Behavior/Cognition: Alert;Pleasant mood;Requires cueing Oral Cavity Assessment: Within Functional Limits Oral Care Completed by SLP: Other (Comment) (Completed by daughter during eval) Oral Cavity - Dentition: Edentulous Vision:  (N/A) Self-Feeding  Abilities: Needs assist;Total assist;Needs set up Patient Positioning: Upright in bed Baseline Vocal Quality: Low vocal intensity;Breathy Volitional Cough:  (NT) Volitional Swallow: Able to elicit    Oral/Motor/Sensory Function Overall Oral Motor/Sensory Function: Within functional limits   Ice Chips Ice chips: Not tested   Thin Liquid Thin Liquid: Within functional limits Presentation: Straw (~ 2 oz)    Nectar Thick Nectar Thick Liquid: Not tested   Honey Thick Honey Thick Liquid: Not tested   Puree Puree: Within functional limits Presentation: Spoon (~ 2 oz)   Solid     Solid: Impaired Presentation: Spoon Oral Phase Functional Implications: Prolonged oral transit (in setting of edentulous status)     Randall Hiss Graduate Clinician [place], Speech Pathology   Randall Hiss 07/11/2022,10:52 AM

## 2022-07-11 NOTE — Progress Notes (Signed)
Natalie [place] for heparin infusion Indication: ACS/STEMI, PE  Allergies  Allergen Reactions   Amoxicillin-Pot Clavulanate Diarrhea   Hydrochlorothiazide Other (See Comments)    "she got shaky and fell" "she got shaky and fell"     Patient Measurements: Height: '5\' 7"'$  (170.2 cm) Weight: 71.7 kg (158 lb 1.1 oz) IBW/kg (Calculated) : 61.6 Heparin Dosing Weight: 72.6 kg  Vital Signs: Temp: 97.9 F (36.6 C) (03/11 0600) BP: 110/76 (03/11 1028) Pulse Rate: 91 (03/11 0600)  Labs: Recent Labs    07/09/22 2208 07/10/22 0021 07/10/22 0520 07/10/22 1104 07/10/22 1638 07/10/22 1800 07/10/22 2029 07/10/22 2205 07/11/22 0943  HGB 10.7*  --  11.3*  --   --   --   --   --  11.0*  HCT 36.1  --  38.3  --   --   --   --   --  36.5  PLT 223  --  179  --   --   --   --   --  233  APTT  --  23*  --   --   --   --   --   --   --   LABPROT  --  14.2 14.9  --   --   --   --   --   --   INR  --  1.1 1.2  --   --   --   --   --   --   HEPARINUNFRC  --   --   --    < >  --  0.89*  --  1.04* 0.54  CREATININE 0.90  --  0.88  --   --   --   --   --  1.22*  TROPONINIHS 641* 729*  --    < > 727* 670* 564*  --   --    < > = values in this interval not displayed.     Estimated Creatinine Clearance: 29.8 mL/min (A) (by C-G formula based on SCr of 1.22 mg/dL (H)).   Medical History: Past Medical History:  Diagnosis Date   Arthritis    Asthma    Diabetes mellitus without complication ([place])    Hypertension    Morbid obesity ([place])     Assessment: Pt is a 89 yo female presenting to ED c/o SOB, found with elevated Troponin I level and right lower lobe arterial emboli  -No anticoagulants PTA per med rec   Goal of Therapy:  Heparin level 0.3-0.7 units/ml Monitor platelets by anticoagulation protocol: Yes  3/10 0049 Heparin drip started 3/10 1104  HL>1.10,  supratherapeutic, hold x 1 hr and resume at 800 units/hr 3/10 1800 HL 0.89 3/10 2205 HL 1.04,  supratherapeutic 3/11 0943 HL 0.54, therapeutic x 1  Plan:  Continue heparin infusion at 650 units/hr Will recheck HL in 8 hr after rate change CBC daily while on heparin  Will M. Ouida Sills, PharmD PGY-1 Pharmacy Resident 07/11/2022 11:28 AM

## 2022-07-11 NOTE — Progress Notes (Signed)
[facility] Progress Note  Patient: Natalie Adams    [id number]  DOA: 07/09/2022     Date of Service: the patient was seen and examined on 07/11/2022  Chief Complaint  Patient presents with   Shortness of Breath   Brief hospital course: Natalie Adams is a 89 y.o. African-American female with medical history significant for type diabetes mellitus, hypertension, asthma and osteoarthritis who presented to the emergency room with acute onset of dyspnea and altered mental status with confusion after having a bowel movement today.  She was hypothermic.  She was not communicative as her usual .  She did not have any reported chest pain. She has mild lower extremity edema without worsening.  No any other symptoms. Ed w/up:  HR 108, RR 41, Tma 96.7 O2 sat 96 on RA,  VBG with pH 7.39, HCO3 32.1 and pCO2 53 and pO2 of 46.  K 5.9 hyperkalemia, SBG 170, troponin 641--729 elevated, BNP 2688 elevated lactic acid 1.7--1.5, Hb 10.7, D-dimer 1.85 elevated   Assessment and Plan:   Acute metabolic encephalopathy could be secondary to hypoxia and hypertensive emergency Continue supportive care Continue fall precautions Continued treatment as below COVID and flu negative, blood culture negative 3/11 alert and awake today  Non-STEMI could be demand ischemia due to hypertensive urgency Patient denies any chest pain Continue aspirin 81 mg p.o. daily and statin Continue IV heparin IV infusion for 48 hours Continue nitroglycerin as needed and IV morphine as needed Troponin 729 -- 920 --564, trending down TTE shows LVEF 35 to 40%, moderate global hypokinesis with regional wall motion abnormality, severe hypokinesis of inferior/posterior wall.  Moderate asymmetric LV hypertrophy of septal segment, grade 1 diastolic dysfunction.  RV systolic function is moderately reduced.  Mild pulmonary hypertension.  Moderate MR, mild to moderate AR, severe aortic valve stenosis. Cardiology consult appreciated,  recommended no ischemic workup at this time due to advanced age and poor prognosis, continue heparin for 48 hours and follow TTE   Pulmonary embolism, Elevated D-dimer, CTA chest/a/p: age-indeterminate subsegmental right lower lobe arterial emboli. No large-vessel or further arterial embolus is seen. Prominent pulmonary trunk as before. venous duplex of Lex: negative for DVT Continue heparin IV infusion for 48 hours as per cardiology, transition to oral anticoagulation when cleared by cardiology    #Acute pulmonary edema It could be secondary to non-STEMI and PE, acute cor pulmonale vs hypertensive emergency BNP elevated, TTE as above consistent with systolic CHF S/p IV Lasix for diuresis, s/p Lasix 40 mg IV BID x 2 dose. 3/11 decrease to Lasix 20 mg IV twice daily due to elevated creatinine level   Hypertensive emergency, SBP 212/127 History of hypertension Patient received IV labetalol and IV Lasix -Continue Lopressor 12.5 twice daily with holding parameters Blood pressure dropped after IV labetalol, discontinued amlodipine for now Use hydralazine p.o. versus IV as needed, follow parameters We will continue monitor BP and titrate medications accordingly 3/11 yesterday evening patient's blood pressure dropped transiently so Levophed was ordered but never initiated.  Patient was transferred to stepdown for close monitoring.  We can transfer her back to cardiac telemetry today.   Hyperkalemia - s/p D50, insulin and IV calcium gluconate. - Potassium 4.8, hyperkalemia resolved K3.7   Type 2 diabetes mellitus without complication, with long-term current use of insulin (HCC) - Developed hypoglycemia on 3/10 Follow hypoglycemia protocol Discontinued scheduled Novolin Continue NovoLog sliding scale Monitor CBG Continue diabetic diet Diabetic counselor consulted   Bilateral hydroureteronephrosis without filling defect CT scan reviewed  Urology consulted for further  recommendation Follow renal sonogram   # Pulmonary nodule and mediastinal lymphadenopathy, incidental finding CTA: Mediastinal lymphadenopathy measuring up to 1.2 cm in short axis is favored to be reactive. Right upper lobe pulmonary nodule measuring 8 mm. Non-contrast chest CT at 6-12 months is recommended. If the nodule is stable at time of repeat CT, then future CT at 18-24 months Recommended to follow with PCP.   Body mass index is 25.06 kg/m.  Interventions:   Pressure Injury 12/12/21 Buttocks Left Stage 2 -  Partial thickness loss of dermis presenting as a shallow open injury with a red, pink wound bed without slough. (Active)  12/12/21 1812  Location: Buttocks  Location Orientation: Left  Staging: Stage 2 -  Partial thickness loss of dermis presenting as a shallow open injury with a red, pink wound bed without slough.  Wound Description (Comments):   Present on Admission: Yes     Pressure Injury 07/10/22 Sacrum Stage 2 -  Partial thickness loss of dermis presenting as a shallow open injury with a red, pink wound bed without slough. (Active)  07/10/22 1800  Location: Sacrum  Location Orientation:   Staging: Stage 2 -  Partial thickness loss of dermis presenting as a shallow open injury with a red, pink wound bed without slough.  Wound Description (Comments):   Present on Admission: Yes  Dressing Type Foam - Lift dressing to assess site every shift 07/11/22 0200     Diet: Diabetic diet DVT Prophylaxis: Therapeutic Anticoagulation with heparin IV infusion    Advance goals of care discussion: DNR  Family Communication: family was present at bedside, at the time of interview.  The pt provided permission to discuss medical plan with the family. Opportunity was given to ask question and all questions were answered satisfactorily.   Disposition:  Pt is from Home, admitted with AMS, elevated troponin, PE, pulmonary edema, respiratory failure, still  on heparin infusion, which  precludes a safe discharge. Discharge to TBD pending PT/OT eval, when clinically stable, may need few days to improve.  Subjective: No significant events overnight, yesterday evening pressure dropped transiently, so patient was moved to ICU for close monitoring, blood pressure remained stable throughout.  Patient is more awake and alert today, denied any complaints, resting comfortably.  Patient is daughter was at bedside, management plan discussed. Patient denies any chest pain or palpitations, no shortness of breath, no abdominal pain.   Overall patient's prognosis is very poor, very high risk to deteriorate.  Patient is DNR/DNI.  consulted palliative care for goals of care discussion.  Physical Exam: General: NAD, lying comfortably Appear in no distress, affect appropriate Eyes: PERRLA ENT: Oral Mucosa Clear, moist  Neck: no JVD,  Cardiovascular: S1 and S2 Present, no Murmur,  Respiratory: good respiratory effort, Bilateral Air entry equal and Decreased, bibasilar Crackles, no wheezes Abdomen: Bowel Sound present, Soft and no tenderness,  Skin: no rashes Extremities: no Pedal edema, no calf tenderness Neurologic: without any new focal findings Gait not checked due to patient safety concerns  Vitals:   07/11/22 0400 07/11/22 0500 07/11/22 0600 07/11/22 1028  BP: (!) 150/70 (!) 124/59 131/80 110/76  Pulse: 98 87 91   Resp: (!) 35 (!) 31 (!) 35   Temp: 97.7 F (36.5 C) 97.7 F (36.5 C) 97.9 F (36.6 C)   TempSrc:      SpO2: 100% 100% 100%   Weight:      Height:        Intake/Output  Summary (Last 24 hours) at 07/11/2022 1132 Last data filed at 07/11/2022 0200 Gross per 24 hour  Intake 89.56 ml  Output 450 ml  Net -360.44 ml   Filed Weights   07/10/22 0022 07/10/22 1423  Weight: 72.6 kg 71.7 kg    Data Reviewed: I have personally reviewed and interpreted daily labs, tele strips, imagings as discussed above. I reviewed all nursing notes, pharmacy notes, vitals,  pertinent old records I have discussed plan of care as described above with RN and patient/family.  CBC: Recent Labs  Lab 07/09/22 2208 07/10/22 0520 07/11/22 0943  WBC 7.8 8.1 8.3  NEUTROABS 5.2  --   --   HGB 10.7* 11.3* 11.0*  HCT 36.1 38.3 36.5  MCV 68.1* 67.3* 66.5*  PLT 223 179 0000000   Basic Metabolic Panel: Recent Labs  Lab 07/09/22 2208 07/10/22 0520 07/10/22 1104 07/11/22 0943  NA 136 136  --  138  K 5.9* 4.8  --  3.7  CL 104 102  --  100  CO2 24 26  --  29  GLUCOSE 170* 230*  --  184*  BUN 23 24*  --  26*  CREATININE 0.90 0.88  --  1.22*  CALCIUM 8.9 9.1  --  8.9  MG  --   --  1.9 1.7  PHOS  --   --  5.0* 4.1    Studies: DG Chest Port 1 View  Result Date: 07/11/2022 CLINICAL DATA:  Shortness of breath EXAM: PORTABLE CHEST 1 VIEW COMPARISON:  07/09/2022 FINDINGS: Cardiac shadow is again enlarged. Aortic calcifications are noted. Small bilateral pleural effusions are again seen. No focal confluent infiltrate is noted. No bony abnormality is seen. IMPRESSION: Small bilateral pleural effusions. No focal confluent infiltrate is seen. Electronically Signed   By: Inez Catalina M.D.   On: 07/11/2022 11:18    Scheduled Meds:  ascorbic acid  1,000 mg Oral Daily   aspirin EC  81 mg Oral Daily   atorvastatin  80 mg Oral Daily   Chlorhexidine Gluconate Cloth  6 each Topical Q0600   dextrose  1 ampule Intravenous Once   ferrous gluconate  324 mg Oral Daily   furosemide  40 mg Intravenous Q12H   insulin aspart  0-9 Units Subcutaneous Q4H   metoprolol tartrate  12.5 mg Oral BID   Continuous Infusions:  sodium chloride     calcium gluconate     dextrose 50 mL/hr at 07/11/22 0514   heparin 650 Units/hr (07/11/22 0654)   norepinephrine (LEVOPHED) Adult infusion     PRN Meds: acetaminophen, ALPRAZolam, hydrALAZINE **OR** hydrALAZINE, magnesium hydroxide, nitroGLYCERIN, ondansetron (ZOFRAN) IV, polyethylene glycol, traZODone  Time spent: 55 minutes  Author: Val Riles. MD [facility] 07/11/2022 11:32 AM  To reach On-call, see care teams to locate the attending and reach out to them via [email or web address]. If 7PM-7AM, please contact night-coverage If you still have difficulty reaching the attending provider, please page the [facility] (Director on Call) for [facility] on amion for assistance.

## 2022-07-11 NOTE — Progress Notes (Signed)
PT Cancellation Note  Patient Details Name: Kihara Saadat MRN: [id number] DOB: 03-06-1930   Cancelled Treatment:    Reason Eval/Treat Not Completed: Patient not medically ready: Pt transferred to the ICU after PT/OT orders received.  Per protocol will hold PT evaluation until new or continue at transfer orders received, MD notified and aware.  Will attempt to see pt at a future date/time as medically appropriate once orders received.       Linus Salmons PT, DPT 07/11/22, 9:40 AM

## 2022-07-11 NOTE — Progress Notes (Signed)
       CROSS COVER NOTE  NAME: Natalie Adams MRN: [id number] DOB : 07/05/1929 ATTENDING PHYSICIAN: Val Riles, MD    Date of Service   07/11/2022   HPI/Events of Note   M(r)s Maxfield had 3 hypoglycemic events in last 24 hours with 100pt drop in CBG in the last 3 hrs. 196-->96. Currently admitted with NSTEMI and pulmonary edema and being diuresed. BNP 2600. Will start D10 to minimize fluid intake.  Interventions   Assessment/Plan:  D10 at 50 mL/hr Continue q4H CBG checks      To reach the provider On-Call:   7AM- 7PM see care teams to locate the attending and reach out to them via [email or web address]. Password: [email or web address] 7PM-7AM contact night-coverage If you still have difficulty reaching the appropriate provider, please page the [facility] (Director on Call) for [facility] on amion for assistance  This document was prepared using Systems analyst and may include unintentional dictation errors.  Neomia Glass DNP, MBA, FNP-BC, PMHNP-BC Nurse Practitioner [facility] Pager [phone]

## 2022-07-12 ENCOUNTER — Inpatient Hospital Stay: Payer: Medicaid Other

## 2022-07-12 ENCOUNTER — Encounter: Payer: Self-pay | Admitting: Family Medicine

## 2022-07-12 ENCOUNTER — Other Ambulatory Visit (HOSPITAL_COMMUNITY): Payer: Self-pay

## 2022-07-12 DIAGNOSIS — I214 Non-ST elevation (NSTEMI) myocardial infarction: Secondary | ICD-10-CM | POA: Diagnosis not present

## 2022-07-12 DIAGNOSIS — N1330 Unspecified hydronephrosis: Secondary | ICD-10-CM

## 2022-07-12 DIAGNOSIS — R310 Gross hematuria: Secondary | ICD-10-CM | POA: Diagnosis not present

## 2022-07-12 DIAGNOSIS — I5021 Acute systolic (congestive) heart failure: Secondary | ICD-10-CM

## 2022-07-12 DIAGNOSIS — N179 Acute kidney failure, unspecified: Secondary | ICD-10-CM

## 2022-07-12 DIAGNOSIS — Z7189 Other specified counseling: Secondary | ICD-10-CM | POA: Diagnosis not present

## 2022-07-12 DIAGNOSIS — J810 Acute pulmonary edema: Secondary | ICD-10-CM | POA: Diagnosis not present

## 2022-07-12 DIAGNOSIS — Z515 Encounter for palliative care: Secondary | ICD-10-CM

## 2022-07-12 LAB — CBC
HCT: 33.4 % — ABNORMAL LOW (ref 36.0–46.0)
Hemoglobin: 10.2 g/dL — ABNORMAL LOW (ref 12.0–15.0)
MCH: 20.4 pg — ABNORMAL LOW (ref 26.0–34.0)
MCHC: 30.5 g/dL (ref 30.0–36.0)
MCV: 66.7 fL — ABNORMAL LOW (ref 80.0–100.0)
Platelets: 187 10*3/uL (ref 150–400)
RBC: 5.01 MIL/uL (ref 3.87–5.11)
RDW: 17.1 % — ABNORMAL HIGH (ref 11.5–15.5)
WBC: 10.3 10*3/uL (ref 4.0–10.5)
nRBC: 0.6 % — ABNORMAL HIGH (ref 0.0–0.2)

## 2022-07-12 LAB — GLUCOSE, CAPILLARY
Glucose-Capillary: 129 mg/dL — ABNORMAL HIGH (ref 70–99)
Glucose-Capillary: 195 mg/dL — ABNORMAL HIGH (ref 70–99)
Glucose-Capillary: 198 mg/dL — ABNORMAL HIGH (ref 70–99)
Glucose-Capillary: 212 mg/dL — ABNORMAL HIGH (ref 70–99)
Glucose-Capillary: 215 mg/dL — ABNORMAL HIGH (ref 70–99)
Glucose-Capillary: 226 mg/dL — ABNORMAL HIGH (ref 70–99)
Glucose-Capillary: 52 mg/dL — ABNORMAL LOW (ref 70–99)

## 2022-07-12 LAB — HEPARIN LEVEL (UNFRACTIONATED): Heparin Unfractionated: 0.29 IU/mL — ABNORMAL LOW (ref 0.30–0.70)

## 2022-07-12 LAB — PHOSPHORUS: Phosphorus: 3.8 mg/dL (ref 2.5–4.6)

## 2022-07-12 LAB — BASIC METABOLIC PANEL
Anion gap: 6 (ref 5–15)
BUN: 29 mg/dL — ABNORMAL HIGH (ref 8–23)
CO2: 30 mmol/L (ref 22–32)
Calcium: 8.3 mg/dL — ABNORMAL LOW (ref 8.9–10.3)
Chloride: 101 mmol/L (ref 98–111)
Creatinine, Ser: 1.4 mg/dL — ABNORMAL HIGH (ref 0.44–1.00)
GFR, Estimated: 36 mL/min — ABNORMAL LOW (ref >60)
Glucose, Bld: 158 mg/dL — ABNORMAL HIGH (ref 70–99)
Potassium: 3.2 mmol/L — ABNORMAL LOW (ref 3.5–5.1)
Sodium: 137 mmol/L (ref 135–145)

## 2022-07-12 LAB — MAGNESIUM: Magnesium: 1.7 mg/dL (ref 1.7–2.4)

## 2022-07-12 MED ORDER — DEXTROSE 50 % IV SOLN
25.0000 g | INTRAVENOUS | Status: AC
  Administered 2022-07-12: 25 g via INTRAVENOUS
  Filled 2022-07-12: qty 50

## 2022-07-12 MED ORDER — APIXABAN 5 MG PO TABS
10.0000 mg | ORAL_TABLET | Freq: Two times a day (BID) | ORAL | Status: DC
  Administered 2022-07-12 – 2022-07-16 (×9): 10 mg via ORAL
  Filled 2022-07-12 (×9): qty 2

## 2022-07-12 MED ORDER — METOPROLOL SUCCINATE ER 25 MG PO TB24
12.5000 mg | ORAL_TABLET | Freq: Every day | ORAL | Status: DC
  Administered 2022-07-13 – 2022-07-16 (×4): 12.5 mg via ORAL
  Filled 2022-07-12: qty 1
  Filled 2022-07-12: qty 0.5
  Filled 2022-07-12 (×2): qty 1

## 2022-07-12 MED ORDER — HEPARIN BOLUS VIA INFUSION
1100.0000 [IU] | Freq: Once | INTRAVENOUS | Status: AC
  Administered 2022-07-12: 1100 [IU] via INTRAVENOUS
  Filled 2022-07-12: qty 1100

## 2022-07-12 MED ORDER — POTASSIUM CHLORIDE CRYS ER 20 MEQ PO TBCR
40.0000 meq | EXTENDED_RELEASE_TABLET | Freq: Once | ORAL | Status: AC
  Administered 2022-07-12: 40 meq via ORAL
  Filled 2022-07-12: qty 2

## 2022-07-12 MED ORDER — APIXABAN 5 MG PO TABS: 5.0000 mg | ORAL_TABLET | Freq: Two times a day (BID) | ORAL | Status: DC

## 2022-07-12 NOTE — TOC Benefit Eligibility Note (Signed)
Patient Advocate Encounter  Insurance verification completed.    The patient is currently admitted and upon discharge could be taking Eliquis 5 mg.  The current 30 day co-pay is $4.00.   The patient is insured through [employer]   Natalie Adams, CPHT Pharmacy Patient Advocate Specialist  Pharmacy Patient Advocate Team Direct Number: [phone]  Fax: [phone]       

## 2022-07-12 NOTE — Progress Notes (Signed)
Progress Note  Patient Name: Natalie Adams Date of Encounter: 07/12/2022  Primary Cardiologist: new - consult by Caryl Comes  Subjective   Somnolent.  No acute overnight cardiac events.  Inpatient Medications    Scheduled Meds:  ascorbic acid  1,000 mg Oral Daily   aspirin EC  81 mg Oral Daily   atorvastatin  80 mg Oral Daily   Chlorhexidine Gluconate Cloth  6 each Topical Q0600   ferrous gluconate  324 mg Oral Daily   insulin aspart  0-9 Units Subcutaneous Q4H   [START ON 07/13/2022] metoprolol succinate  12.5 mg Oral Daily   Continuous Infusions:  sodium chloride     heparin 800 Units/hr (07/12/22 0605)   PRN Meds: acetaminophen, ALPRAZolam, hydrALAZINE **OR** hydrALAZINE, magnesium hydroxide, nitroGLYCERIN, ondansetron (ZOFRAN) IV, polyethylene glycol, traZODone   Vital Signs    Vitals:   07/12/22 0300 07/12/22 0400 07/12/22 0500 07/12/22 0600  BP: (!) 146/80 127/67 130/64 (!) 129/118  Pulse: 87 72 95 78  Resp: (!) 35 (!) 45 (!) 24 (!) 40  Temp: 98.2 F (36.8 C) 98.1 F (36.7 C) 97.7 F (36.5 C) (!) 97.3 F (36.3 C)  TempSrc:  Rectal    SpO2: 100% 100% 100% 100%  Weight:   70.2 kg   Height:        Intake/Output Summary (Last 24 hours) at 07/12/2022 1052 Last data filed at 07/12/2022 0610 Gross per 24 hour  Intake 341.53 ml  Output 1550 ml  Net -1208.47 ml   Filed Weights   07/10/22 0022 07/10/22 1423 07/12/22 0500  Weight: 72.6 kg 71.7 kg 70.2 kg    Telemetry    Sinus rhythm with rare ventricular couplet - Personally Reviewed  ECG    No new tracings - Personally Reviewed  Physical Exam   GEN: Somnolent, no acute distress.   Neck: No JVD. Cardiac: RRR, II/VI systolic murmur LSB, I/VI diastolic murmur RUSB, no rubs, or gallops.  Respiratory: Clear to auscultation bilaterally.  GI: Soft, nontender, non-distended.   MS: No edema; No deformity. Neuro: Somnolent.  Psych: Somnolent.  Labs    Chemistry Recent Labs  Lab 07/09/22 2208  07/10/22 0520 07/11/22 0943 07/12/22 0525  NA 136 136 138 137  K 5.9* 4.8 3.7 3.2*  CL 104 102 100 101  CO2 '24 26 29 30  '$ GLUCOSE 170* 230* 184* 158*  BUN 23 24* 26* 29*  CREATININE 0.90 0.88 1.22* 1.40*  CALCIUM 8.9 9.1 8.9 8.3*  PROT 6.8  --   --   --   ALBUMIN 3.0*  --   --   --   AST 113*  --   --   --   ALT 75*  --   --   --   ALKPHOS 110  --   --   --   BILITOT 1.3*  --   --   --   GFRNONAA >60 >60 42* 36*  ANIONGAP '8 8 9 6     '$ Hematology Recent Labs  Lab 07/10/22 0520 07/11/22 0943 07/12/22 0525  WBC 8.1 8.3 10.3  RBC 5.69* 5.49* 5.01  HGB 11.3* 11.0* 10.2*  HCT 38.3 36.5 33.4*  MCV 67.3* 66.5* 66.7*  MCH 19.9* 20.0* 20.4*  MCHC 29.5* 30.1 30.5  RDW 18.1* 17.4* 17.1*  PLT 179 233 187    Cardiac EnzymesNo results for input(s): "TROPONINI" in the last 168 hours. No results for input(s): "TROPIPOC" in the last 168 hours.   BNP Recent Labs  Lab 07/09/22 2208  BNP 2,688.1*     DDimer  Recent Labs  Lab 07/10/22 0419  DDIMER 1.85*     Radiology    CT CHEST WO CONTRAST  Result Date: 07/12/2022 IMPRESSION: 1. Bilateral pleural effusions layering dependently, slightly larger on the right than the left, similar to the previous exam. Dependent pulmonary atelectasis, particularly at the right lung base. This could be simple atelectasis or mild atelectatic pneumonia, particularly at the right lung base. 2. Prominent cardiomegaly. Coronary artery calcification. Small amount of pericardial fluid. 3. Aortic atherosclerotic calcification. 4. 8 mm right upper lobe nodule, unchanged since the previous exam. As previously, noncontrast chest CT at 6-12 months is recommended. 5. Aortic atherosclerosis. Aortic Atherosclerosis (ICD10-I70.0). Electronically Signed   By: Nelson Chimes M.D.   On: 07/12/2022 10:35   US RENAL  Result Date: 07/12/2022 IMPRESSION: 1.  hick-walled appearance of the urinary bladder. 2.  Mild left hydronephrosis.  No right hydronephrosis. 3.  Mildly  echogenic kidneys 4.   Bilateral effusions Electronically Signed   By: Donavan Foil M.D.   On: 07/12/2022 02:16   DG Chest Port 1 View  Result Date: 07/11/2022 IMPRESSION: Small bilateral pleural effusions. No focal confluent infiltrate is seen. Electronically Signed   By: Inez Catalina M.D.   On: 07/11/2022 11:18    Cardiac Studies   2D echo 07/10/2022: 1. Left ventricular ejection fraction, by estimation, is 35 to 40%. The  left ventricle has moderately decreased function. The left ventricle  demonstrates moderate global hypokinesis with regional wall motion  abnormalities (severe hypokinesis of the  inferior/posterior wall). There is moderate asymmetric left ventricular  hypertrophy of the septal segment with no significant LVOT gradient  measured. Left ventricular diastolic parameters are consistent with Grade  I diastolic dysfunction (impaired  relaxation).   2. Right ventricular systolic function is moderately reduced. The right  ventricular size is normal. There is mildly elevated pulmonary artery  systolic pressure. The estimated right ventricular systolic pressure is  [id number] mmHg.   3. The mitral valve is normal in structure, moderately calcified.  Moderate mitral valve regurgitation. No evidence of mitral stenosis.   4. The aortic valve is normal in structure. There is severe calcifcation  of the aortic valve. Aortic valve regurgitation is mild to moderate.  Severe aortic valve stenosis. Aortic valve area, by VTI measures 0.40 cm.  Aortic valve mean gradient measures  44.5 mmHg. Aortic valve Vmax measures 4.27 m/s.   5. The inferior vena cava is normal in size with <50% respiratory  variability, suggesting right atrial pressure of 8 mmHg.   6. Moderate pleural effusion in the left lateral region.   Patient Profile     89 y.o. female with history of DM, HTN, HLD, asthma, osteoarthritis, altered mental and bedbound status who we are seeing for evaluation of elevated  high-sensitivity troponin and HFrEF.  Assessment & Plan    1.  Elevated high-sensitivity troponin: -It is unclear if her troponin elevation represents an NSTEMI versus supply/demand ischemia in the setting of hypertensive emergency, HFrEF, and suspected PE -She has completed 48 hours of heparin drip -With underlying comorbidities including altered mental status at baseline and bedbound status, patient is not a candidate for invasive cardiac testing, recommend conservative management with transitioning heparin drip to DOAC given suspected PE at the discretion of internal medicine -ASA and statin  2.  Acute HFrEF: -She does not appear grossly volume overloaded -Given her cardiomyopathy, we will transition her from Lopressor to Toprol-XL -Relative hypotension and AKI  preclude escalation of GDMT with ACE inhibitor, ARB, ARNI, MRA, or SGLT2i -Hold furosemide with noted decline in renal function  3.  PE: -CTA chest revealed right lower lobe arterial emboli with no large vessel or further arterial embolus noted -Remains on heparin drip per internal medicine, will defer transition to [place] to them (no plans for inpatient invasive ischemic testing from a cardiac perspective)  4.  HTN: -Blood pressure much improved to somewhat soft currently -Transition Lopressor to Toprol-XL given cardiomyopathy as outlined above  5.  HLD: -LDL 55 -Atorvastatin  6.  AKI: -It is unclear if this is related to overdiuresis versus possible contrast-induced nephropathy -However, I do suspect there is some degree of overdiuresis in the context of relative hypotension noted as well -Hold furosemide for today  7.  Microcytic anemia: -Largely stable -Ongoing management per internal medicine -Patient will need follow-up with PCP for monitoring of hemoglobin given need for DOAC at discharge      For questions or updates, please contact [place] Please consult [email or web address] for contact info under  Cardiology/STEMI.    Signed, Christell Faith, PA-C [facility] Pager: [phone] 07/12/2022, 10:52 AM

## 2022-07-12 NOTE — Consult Note (Signed)
Urology Consult  I have been asked to see the patient by Dr. Dwyane Dee, for evaluation and management of incidental hydronephrosis.  Chief Complaint: n/a  History of Present Illness: Natalie Adams is a 89 y.o. year old female admitted with NSTEMI/ AMS/hypertensive emergency/PE to ICU found to have incidental hydronephrosis.    Natalie Adams underwent a CT angio chest abdomen pelvis with and without contrast on admission on 07/10/2022 indicating some mild bilateral renal pelvic fullness/hydronephrosis, left greater than right.  The bladder also appeared to be trabeculated and multiple small diverticula.  Notably, her creatinine is also been rising, initially 0.88 on admission, 1.22 yesterday and 1.4 today.  She has been voiding spontaneously which is red-tinged.  This is the setting of treatment dose anticoagulation.  PVR yesterday was 300 cc.  Review of previous hospitalizations indicate that she does have a personal history of retention and hydronephrosis.  In the last admission, she did require Foley catheter but ultimately was discharged home without this.  Patient is sleeping soundly and not easily arousable this unable to contribute much to her history today.  Past Medical History:  Diagnosis Date   Arthritis    Asthma    Diabetes mellitus without complication ([place])    Hypertension    Morbid obesity ([place])     Past Surgical History:  Procedure Laterality Date   CATARACT EXTRACTION, BILATERAL     ORIF FEMUR FRACTURE Left 06/05/2020   Procedure: OPEN REDUCTION INTERNAL FIXATION (ORIF) DISTAL FEMUR FRACTURE;  Surgeon: Corky Mull, MD;  Location: [facility] ORS;  Service: Orthopedics;  Laterality: Left;    Home Medications:  Current Meds  Medication Sig   amLODipine (NORVASC) 10 MG tablet Take 5 mg by mouth daily.   ascorbic acid (VITAMIN C) 1000 MG tablet Take 1,000 mg by mouth daily.   Boswellia-Glucosamine-Vit D (OSTEO BI-FLEX ONE PER DAY PO) Take 1 capsule by mouth daily.    Cranberry 300 MG tablet Take 300 mg by mouth 2 (two) times daily.   Ferrous Fumarate (IRON) 18 MG TBCR Take 1 tablet (18 mg total) by mouth daily. Can take any over-the-counter iron supplement.   insulin lispro (HUMALOG) 100 UNIT/ML injection Sliding scale insulin # of units = (BS-150/50) TID AC   insulin NPH Human (NOVOLIN N) 100 UNIT/ML injection Inject 8-14 Units into the skin 2 (two) times daily before a meal. 14 units qam 8 units qpm   ipratropium-albuterol (DUONEB) 0.5-2.5 (3) MG/3ML SOLN SMARTSIG:1 Ampule(s) Via Nebulizer Every 6 Hours PRN   metoprolol tartrate (LOPRESSOR) 25 MG tablet Take 12.5 mg by mouth 2 (two) times daily.   Multiple Vitamins tablet Take by mouth.   Omega-3 Fatty Acids (FISH OIL) 1200 MG CAPS Take by mouth.   pravastatin (PRAVACHOL) 40 MG tablet Take 40 mg by mouth at bedtime.    Allergies:  Allergies  Allergen Reactions   Amoxicillin-Pot Clavulanate Diarrhea   Hydrochlorothiazide Other (See Comments)    "she got shaky and fell" "she got shaky and fell"     Family History  Problem Relation Age of Onset   Hypertension Mother     Social History:  reports that she has never smoked. She has never used smokeless tobacco. No history on file for alcohol use and drug use.  ROS: Unable to contribute  Physical Exam:  Vital signs in last 24 hours: Temp:  [97.3 F (36.3 C)-100 F (37.8 C)] 97.3 F (36.3 C) (03/12 0600) Pulse Rate:  [71-99] 78 (03/12 0600)  Resp:  [22-53] 40 (03/12 0600) BP: (110-163)/(56-118) 129/118 (03/12 0600) SpO2:  [96 %-100 %] 100 % (03/12 0600) Weight:  [70.2 kg] 70.2 kg (03/12 0500) Constitutional:  Sleeping HEENT: [place] AT, moist mucus membranes.  Trachea midline, no masses GI: Abdomen is soft, nontender, nondistended, no abdominal masses GU: Red tinged urine in canister Lymph: No cervical or inguinal adenopathy Neurologic: Grossly intact, no focal deficits, moving all 4 extremities Psychiatric: Normal mood and  affect   Laboratory Data:  Recent Labs    07/10/22 0520 07/11/22 0943 07/12/22 0525  WBC 8.1 8.3 10.3  HGB 11.3* 11.0* 10.2*  HCT 38.3 36.5 33.4*   Recent Labs    07/10/22 0520 07/11/22 0943 07/12/22 0525  NA 136 138 137  K 4.8 3.7 3.2*  CL 102 100 101  CO2 '26 29 30  '$ GLUCOSE 230* 184* 158*  BUN 24* 26* 29*  CREATININE 0.88 1.22* 1.40*  CALCIUM 9.1 8.9 8.3*   Recent Labs    07/10/22 0021 07/10/22 0520  INR 1.1 1.2    Radiologic Imaging: US RENAL  Result Date: 07/12/2022 CLINICAL DATA:  Bladder obstruction EXAM: RENAL / URINARY TRACT ULTRASOUND COMPLETE COMPARISON:  CT 07/10/2022 FINDINGS: Right Kidney: Renal measurements: 10 x 4.1 x 5.5 cm = volume: 119.2 mL. Cortex is echogenic. No mass or hydronephrosis. Left Kidney: Renal measurements: 9.9 x 4.4 x 3.9 cm = volume: 88.8 mL. Cortex is slightly echogenic. Negative for mass. Mild hydronephrosis. Bladder: Thick-walled appearance of urinary bladder Other: Bilateral pleural effusions IMPRESSION: 1.  hick-walled appearance of the urinary bladder. 2.  Mild left hydronephrosis.  No right hydronephrosis. 3.  Mildly echogenic kidneys 4.   Bilateral effusions Electronically Signed   By: Donavan Foil M.D.   On: 07/12/2022 02:16   DG Chest Port 1 View  Result Date: 07/11/2022 CLINICAL DATA:  Shortness of breath EXAM: PORTABLE CHEST 1 VIEW COMPARISON:  07/09/2022 FINDINGS: Cardiac shadow is again enlarged. Aortic calcifications are noted. Small bilateral pleural effusions are again seen. No focal confluent infiltrate is noted. No bony abnormality is seen. IMPRESSION: Small bilateral pleural effusions. No focal confluent infiltrate is seen. Electronically Signed   By: Inez Catalina M.D.   On: 07/11/2022 11:18    IMPRESSION: 1. Question of impacted stool in the rectum with developing mild stercoral colitis. 2. Mild hydronephrosis and perinephric stranding. This is similar to prior and may be chronic though recommend correlation  with urinalysis to exclude infection. 3. Cholelithiasis without evidence of cholecystitis. 4. Aortic atherosclerosis and aortic valve calcification.     Electronically Signed   By: Placido Sou M.D.   On: 12/11/2021 23:02  IMPRESSION: 1. Pelvic and perinephric edema, question recently relieved urinary retention. 2. Cholelithiasis without cholecystitis. 3. Atherosclerosis. 4. Prominent aortic valve calcification.     Electronically Signed   By: Monte Fantasia M.D.   On: 10/09/2020 07:07  Each of the above imaging studies were personally reviewed, agree with radiologic interpretation.  Chronic appearing incomplete bladder emptying/ urinary retention varying degrees of hydronephrosis, L>R with occasional perinephric edema.    Impression/ Plan:  Hydronephrosis-relatively minimal and varying degrees on imaging; chronic.  Suspect this is related to incomplete bladder emptying.  Would hold off on aggressive intervention given her multiple medical comorbidities and low suspicion for pathology.  If her creatinine continues to rise, could consider placing a Foley catheter.  2.  Gross hematuria-urine appears red-tinged in the setting of heparin infusion.  Would recommend urine recheck as an outpatient, consider cystoscopy  or hematuria evaluation pending patient's overall health, desire for aggressive intervention if she does have underlying pathology and goals of care.  Can be deferred to outpatient unless hematuria worsens significantly.  If she demonstrates signs of systemic infection, consider UA/urine culture to rule out infection.  3. AKI- likely multifactorial in the setting of incomplete bladder emptying with personal history of urinary retention in the past felt to be exacerbated by constipation.  He is also received IV contrast and other nephrotoxic agents.  Currently PVRs are in the 300 range which is reasonable, consider rechecking today.  If creatinine continues to rise, could  consider Foley catheter placement but prefer to avoid given her frailty and concern for Foley as a nidus for infection.  Hydrate as possible and avoid nephrotoxic agents.  We will follow peripherally and see as needed.  Please reach out with any questions or concerns.  Will arrange for outpatient follow-up.  07/12/2022, 8:03 AM  Hollice Espy,  MD

## 2022-07-12 NOTE — Progress Notes (Signed)
[facility] Progress Note  Patient: Natalie Adams    [id number]  DOA: 07/09/2022     Date of Service: the patient was seen and examined on 07/12/2022  Chief Complaint  Patient presents with   Shortness of Breath   Brief hospital course: Natalie Adams is a 89 y.o. African-American female with medical history significant for type diabetes mellitus, hypertension, asthma and osteoarthritis who presented to the emergency room with acute onset of dyspnea and altered mental status with confusion after having a bowel movement today.  She was hypothermic.  She was not communicative as her usual .  She did not have any reported chest pain. She has mild lower extremity edema without worsening.  No any other symptoms. Ed w/up:  HR 108, RR 41, Tma 96.7 O2 sat 96 on RA,  VBG with pH 7.39, HCO3 32.1 and pCO2 53 and pO2 of 46.  K 5.9 hyperkalemia, SBG 170, troponin 641--729 elevated, BNP 2688 elevated lactic acid 1.7--1.5, Hb 10.7, D-dimer 1.85 elevated   Assessment and Plan:   Acute metabolic encephalopathy could be secondary to hypoxia and hypertensive emergency Continue supportive care Continue fall precautions Continued treatment as below COVID and flu negative, blood culture negative 3/11 alert and awake   Elevated troponin most likely due to demand ischemia secondary to hypertensive urgency and pulmonary embolism, Less likely Non-STEMI, Patient denies any chest pain. Continue aspirin 81 mg p.o. daily and statin S/p IV heparin gtt, d/c'd on 3/12   Continue nitroglycerin as needed and IV morphine as needed Troponin 729 -- 920 --564, trending down TTE shows LVEF 35 to 40%, moderate global hypokinesis with regional wall motion abnormality, severe hypokinesis of inferior/posterior wall.  Moderate asymmetric LV hypertrophy of septal segment, grade 1 diastolic dysfunction.  RV systolic function is moderately reduced.  Mild pulmonary hypertension.  Moderate MR, mild to moderate AR, severe aortic  valve stenosis. Cardiology consult appreciated, recommended no ischemic workup at this time due to advanced age and poor prognosis, continue heparin for 48 hours and follow TTE   Pulmonary embolism, Elevated D-dimer, CTA chest/a/p: age-indeterminate subsegmental right lower lobe arterial emboli. No large-vessel or further arterial embolus is seen. Prominent pulmonary trunk as before. venous duplex of Lex: negative for DVT S/p heparin IV infusion, on 3/12 transition to Eliquis 10 mg p.o. twice daily for 7 days followed by 5 mg p.o. twice daily  for 48 hours as per cardiology, transition to oral anticoagulation when cleared by cardiology    #Acute pulmonary edema It could be secondary to non-STEMI and PE, acute cor pulmonale vs hypertensive emergency BNP elevated, TTE as above consistent with systolic CHF S/p IV Lasix for diuresis, s/p Lasix 40 mg IV BID x 2 dose. 3/11 decrease to Lasix 20 mg IV twice daily due to elevated creatinine level 3/12 CT chest: Bilateral pleural effusions layering dependently, slightly larger on the right than the left, similar to the previous exam. Dependent pulmonary atelectasis, particularly at the right lung base. Thiscould be simple atelectasis or mild atelectatic pneumonia, particularly at the right lung base. Patient is off and on having shortness of breath, currently saturating well on room air, may not need thoracentesis.  Hypertensive emergency, SBP 212/127 History of hypertension Patient received IV labetalol and IV Lasix -Continue Lopressor 12.5 twice daily with holding parameters Blood pressure dropped after IV labetalol, discontinued amlodipine for now Use hydralazine p.o. versus IV as needed, follow parameters We will continue monitor BP and titrate medications accordingly 3/11 yesterday evening patient's blood pressure  dropped transiently so Levophed was ordered but never initiated.  Patient was transferred to stepdown for close monitoring.  We  can transfer her back to cardiac telemetry.   Hyperkalemia, resolved - s/p D50, insulin and IV calcium gluconate. - Potassium 4.8, hyperkalemia resolved K3.7   Type 2 diabetes mellitus without complication, with long-term current use of insulin (HCC) - Developed hypoglycemia on 3/10 Follow hypoglycemia protocol Discontinued scheduled Novolin Continue NovoLog sliding scale Monitor CBG Continue diabetic diet Diabetic counselor consulted   Bilateral hydroureteronephrosis without filling defect CT scan reviewed US renal: hick-walled appearance of the urinary bladder.  Mild left hydronephrosis.  No right hydronephrosis. Mildly echogenic kidneys.  Bilateral effusions Urology consulted for further recommendation  Gross hematuria, most likely due to heparin infusion Hb 11.3--10.2 Monitor H&H and transfuse if hemoglobin less than 7 Urology following   # Pulmonary nodule and mediastinal lymphadenopathy, incidental finding CTA: Mediastinal lymphadenopathy measuring up to 1.2 cm in short axis is favored to be reactive. Right upper lobe pulmonary nodule measuring 8 mm. Non-contrast chest CT at 6-12 months is recommended. If the nodule is stable at time of repeat CT, then future CT at 18-24 months Recommended to follow with PCP.   Body mass index is 25.06 kg/m.  Interventions:   Pressure Injury 12/12/21 Buttocks Left Stage 2 -  Partial thickness loss of dermis presenting as a shallow open injury with a red, pink wound bed without slough. (Active)  12/12/21 1812  Location: Buttocks  Location Orientation: Left  Staging: Stage 2 -  Partial thickness loss of dermis presenting as a shallow open injury with a red, pink wound bed without slough.  Wound Description (Comments):   Present on Admission: Yes     Pressure Injury 07/10/22 Sacrum Stage 2 -  Partial thickness loss of dermis presenting as a shallow open injury with a red, pink wound bed without slough. (Active)  07/10/22 1800   Location: Sacrum  Location Orientation:   Staging: Stage 2 -  Partial thickness loss of dermis presenting as a shallow open injury with a red, pink wound bed without slough.  Wound Description (Comments):   Present on Admission: Yes  Dressing Type Foam - Lift dressing to assess site every shift 07/12/22 0400     Diet: Diabetic diet DVT Prophylaxis: Therapeutic Anticoagulation with heparin IV infusion    Advance goals of care discussion: DNR  Family Communication: family was present at bedside, at the time of interview.  The pt provided permission to discuss medical plan with the family. Opportunity was given to ask question and all questions were answered satisfactorily.  Discussed with patient's daughter at bedside, palliative care was consulted.  Plan is to discharge home with hospice care when stable.   Disposition:  Pt is from Home, admitted with AMS, elevated troponin, PE, pulmonary edema, respiratory failure, still  on heparin infusion, which precludes a safe discharge. Discharge to TBD pending PT/OT eval, when clinically stable, may need few days to improve.  Subjective: No significant events overnight, patient was sleepy, woke up and stated that she is feeling all right, not very alert and awake today.  Patient's daughter was at bedside, management plan discussed.   Physical Exam: General: NAD, lying comfortably Appear in no distress, affect appropriate Eyes: PERRLA ENT: Oral Mucosa Clear, moist  Neck: no JVD,  Cardiovascular: S1 and S2 Present, no Murmur,  Respiratory: good respiratory effort, Bilateral Air entry equal and Decreased, bibasilar Crackles, no wheezes Abdomen: Bowel Sound present, Soft and no tenderness,  Skin: no rashes Extremities: no Pedal edema, no calf tenderness Neurologic: without any new focal findings Gait not checked due to patient safety concerns  Vitals:   07/12/22 0300 07/12/22 0400 07/12/22 0500 07/12/22 0600  BP: (!) 146/80 127/67 130/64  (!) 129/118  Pulse: 87 72 95 78  Resp: (!) 35 (!) 45 (!) 24 (!) 40  Temp: 98.2 F (36.8 C) 98.1 F (36.7 C) 97.7 F (36.5 C) (!) 97.3 F (36.3 C)  TempSrc:  Rectal    SpO2: 100% 100% 100% 100%  Weight:   70.2 kg   Height:        Intake/Output Summary (Last 24 hours) at 07/12/2022 1552 Last data filed at 07/12/2022 0610 Gross per 24 hour  Intake 221.53 ml  Output 1050 ml  Net -828.47 ml   Filed Weights   07/10/22 0022 07/10/22 1423 07/12/22 0500  Weight: 72.6 kg 71.7 kg 70.2 kg    Data Reviewed: I have personally reviewed and interpreted daily labs, tele strips, imagings as discussed above. I reviewed all nursing notes, pharmacy notes, vitals, pertinent old records I have discussed plan of care as described above with RN and patient/family.  CBC: Recent Labs  Lab 07/09/22 2208 07/10/22 0520 07/11/22 0943 07/12/22 0525  WBC 7.8 8.1 8.3 10.3  NEUTROABS 5.2  --   --   --   HGB 10.7* 11.3* 11.0* 10.2*  HCT 36.1 38.3 36.5 33.4*  MCV 68.1* 67.3* 66.5* 66.7*  PLT 223 179 233 [id number]   Basic Metabolic Panel: Recent Labs  Lab 07/09/22 2208 07/10/22 0520 07/10/22 1104 07/11/22 0943 07/12/22 0525  NA 136 136  --  138 137  K 5.9* 4.8  --  3.7 3.2*  CL 104 102  --  100 101  CO2 24 26  --  29 30  GLUCOSE 170* 230*  --  184* 158*  BUN 23 24*  --  26* 29*  CREATININE 0.90 0.88  --  1.22* 1.40*  CALCIUM 8.9 9.1  --  8.9 8.3*  MG  --   --  1.9 1.7 1.7  PHOS  --   --  5.0* 4.1 3.8    Studies: CT CHEST WO CONTRAST  Result Date: 07/12/2022 CLINICAL DATA:  Pneumonia. Complication suspected. Diabetes, hypertension and asthma. Shortness of breath. EXAM: CT CHEST WITHOUT CONTRAST TECHNIQUE: Multidetector CT imaging of the chest was performed following the standard protocol without IV contrast. RADIATION DOSE REDUCTION: This exam was performed according to the departmental dose-optimization program which includes automated exposure control, adjustment of the mA and/or kV according  to patient size and/or use of iterative reconstruction technique. COMPARISON:  Chest radiography yesterday.  Prior CT 07/10/2022. FINDINGS: Cardiovascular: Prominent cardiomegaly. Small amount of pericardial fluid. Coronary artery calcification and aortic atherosclerotic calcification. Mediastinum/Nodes: Mild mediastinal nodal prominence, likely reactive, not visibly changed since the prior exam. Lungs/Pleura: Bilateral pleural effusions layering dependently, slightly larger on the right than the left, similar to the previous exam. Dependent pulmonary atelectasis, particularly the lung bases, right more than left. This could be simple atelectasis or mild atelectatic pneumonia, particularly at the right lung base. Evaluation of lung parenchyma is degraded by breathing motion. There does appear be redemonstration of the 8 mm right upper lobe nodule axial image 54. No new focal pulmonary finding. As previously, noncontrast chest CT at 6-12 months is recommended. Upper Abdomen: No acute upper abdominal finding. Musculoskeletal: Chronic spinal degenerative changes. No acute finding. IMPRESSION: 1. Bilateral pleural effusions layering dependently, slightly larger on  the right than the left, similar to the previous exam. Dependent pulmonary atelectasis, particularly at the right lung base. This could be simple atelectasis or mild atelectatic pneumonia, particularly at the right lung base. 2. Prominent cardiomegaly. Coronary artery calcification. Small amount of pericardial fluid. 3. Aortic atherosclerotic calcification. 4. 8 mm right upper lobe nodule, unchanged since the previous exam. As previously, noncontrast chest CT at 6-12 months is recommended. 5. Aortic atherosclerosis. Aortic Atherosclerosis (ICD10-I70.0). Electronically Signed   By: Nelson Chimes M.D.   On: 07/12/2022 10:35   US RENAL  Result Date: 07/12/2022 CLINICAL DATA:  Bladder obstruction EXAM: RENAL / URINARY TRACT ULTRASOUND COMPLETE COMPARISON:  CT  07/10/2022 FINDINGS: Right Kidney: Renal measurements: 10 x 4.1 x 5.5 cm = volume: 119.2 mL. Cortex is echogenic. No mass or hydronephrosis. Left Kidney: Renal measurements: 9.9 x 4.4 x 3.9 cm = volume: 88.8 mL. Cortex is slightly echogenic. Negative for mass. Mild hydronephrosis. Bladder: Thick-walled appearance of urinary bladder Other: Bilateral pleural effusions IMPRESSION: 1.  hick-walled appearance of the urinary bladder. 2.  Mild left hydronephrosis.  No right hydronephrosis. 3.  Mildly echogenic kidneys 4.   Bilateral effusions Electronically Signed   By: Donavan Foil M.D.   On: 07/12/2022 02:16    Scheduled Meds:  apixaban  10 mg Oral BID   Followed by   Derrill Memo ON 07/19/2022] apixaban  5 mg Oral BID   ascorbic acid  1,000 mg Oral Daily   aspirin EC  81 mg Oral Daily   atorvastatin  80 mg Oral Daily   Chlorhexidine Gluconate Cloth  6 each Topical Q0600   ferrous gluconate  324 mg Oral Daily   insulin aspart  0-9 Units Subcutaneous Q4H   [START ON 07/13/2022] metoprolol succinate  12.5 mg Oral Daily   Continuous Infusions:  sodium chloride     PRN Meds: acetaminophen, ALPRAZolam, hydrALAZINE **OR** hydrALAZINE, magnesium hydroxide, nitroGLYCERIN, ondansetron (ZOFRAN) IV, polyethylene glycol, traZODone  Time spent: 35 minutes  Author: Val Riles. MD [facility] 07/12/2022 3:52 PM  To reach On-call, see care teams to locate the attending and reach out to them via [email or web address]. If 7PM-7AM, please contact night-coverage If you still have difficulty reaching the attending provider, please page the [facility] (Director on Call) for [facility] on amion for assistance.

## 2022-07-12 NOTE — Evaluation (Signed)
Physical Therapy Evaluation Patient Details Name: Natalie Adams MRN: [id number] DOB: 11-Mar-1930 Today's Date: 07/12/2022  History of Present Illness  Pt is a 89 y.o. African-American female with medical history significant for type diabetes mellitus, hypertension, asthma and osteoarthritis who presented to the emergency room with acute onset of dyspnea and altered mental status with confusion after having a bowel movement. MD assessment includes: acute metabolic encephalopathy possibly secondary to hypoxia and hypertensive emergency, NSTEMI, PE, acute pulmonary edema, hyperkalemia, bilateral hydroureteronephrosis, and pulmonary nodule and mediastinal lymphadenopathy.   Clinical Impression  Pt somewhat lethargic with very flat affect and limited ability to provide history with below history obtained by daughter in the room.  Pt was a limited household ambulator and able to perform transfers up until around 6 months or so ago but since has declined to where she is dependent level and has been needing a hoyer for transfers and has been using a bed pan for toileting.  Pt required +2 total assist for bed mobility tasks and near constant assist while sitting at the EOB to prevent posterior LOB.  Pt did attempt to initiate anterior weight shifting and some BLE exercises but fatigued very quickly with only minimal exertion.  Pt will benefit from a trial of HHPT upon discharge to safely address deficits listed in patient problem list for decreased caregiver assistance.        Recommendations for follow up therapy are one component of a multi-disciplinary discharge planning process, led by the attending physician.  Recommendations may be updated based on patient status, additional functional criteria and insurance authorization.  Follow Up Recommendations Home health PT      Assistance Recommended at Discharge Frequent or constant Supervision/Assistance  Patient can return home with the following  Two  people to help with walking and/or transfers;A lot of help with bathing/dressing/bathroom;Assistance with cooking/housework;Assistance with feeding;Direct supervision/assist for medications management;Direct supervision/assist for financial management;Assist for transportation;Help with stairs or ramp for entrance    Equipment Recommendations None recommended by PT  Recommendations for Other Services       Functional Status Assessment Patient has had a recent decline in their functional status and/or demonstrates limited ability to make significant improvements in function in a reasonable and predictable amount of time     Precautions / Restrictions Precautions Precautions: Fall Restrictions Weight Bearing Restrictions: No      Mobility  Bed Mobility Overal bed mobility: Needs Assistance       Supine to sit: +2 for physical assistance, Total assist Sit to supine: +2 for physical assistance, Total assist   General bed mobility comments: total assist for BLE and trunk control    Transfers                   General transfer comment: unsafe to attempt poor sitting balance/tolerance with constant assist to prevent posterior LOB    Ambulation/Gait                  Stairs            Wheelchair Mobility    Modified Rankin (Stroke Patients Only)       Balance Overall balance assessment: Needs assistance Sitting-balance support: Bilateral upper extremity supported, Feet supported Sitting balance-Leahy Scale: Zero   Postural control: Posterior lean     Standing balance comment: unable/unsafe to attempt  Pertinent Vitals/Pain Pain Assessment Pain Assessment: PAINAD Breathing: normal Negative Vocalization: none Facial Expression: smiling or inexpressive Body Language: relaxed Consolability: no need to console PAINAD Score: 0    Home Living Family/patient expects to be discharged to:: Private  residence Living Arrangements: Children Available Help at Discharge: Family;Available 24 hours/day Type of Home: House Home Access: Ramped entrance     Alternate Level Stairs-Number of Steps: Stair lift Home Layout: Two level Home Equipment: Conservation officer, nature (2 wheels);BSC/3in1;Wheelchair - manual Additional Comments: hoyer lift, bed rails on elevated bed    Prior Function               Mobility Comments: Around 6 months ago pt able to ambulate limited household distances with a RW and perform transfers, has been dependent with transfers with hoyer lift since ADLs Comments: Assist with all ADLs     Hand Dominance        Extremity/Trunk Assessment   Upper Extremity Assessment Upper Extremity Assessment: Generalized weakness    Lower Extremity Assessment Lower Extremity Assessment: Generalized weakness       Communication   Communication: HOH  Cognition Arousal/Alertness: Lethargic Behavior During Therapy: Flat affect Overall Cognitive Status: Difficult to assess                                          General Comments      Exercises     Assessment/Plan    PT Assessment Patient needs continued PT services  PT Problem List Decreased strength;Decreased activity tolerance;Decreased balance;Decreased mobility       PT Treatment Interventions Functional mobility training;Therapeutic activities;Therapeutic exercise;Balance training;Patient/family education    PT Goals (Current goals can be found in the Care Plan section)  Acute Rehab PT Goals Patient Stated Goal: To be stronger PT Goal Formulation: With family Time For Goal Achievement: 07/25/22 Potential to Achieve Goals: Fair    Frequency Min 2X/week     Co-evaluation               AM-PAC PT "6 Clicks" Mobility  Outcome Measure Help needed turning from your back to your side while in a flat bed without using bedrails?: Total Help needed moving from lying on your back to  sitting on the side of a flat bed without using bedrails?: Total Help needed moving to and from a bed to a chair (including a wheelchair)?: Total Help needed standing up from a chair using your arms (e.g., wheelchair or bedside chair)?: Total Help needed to walk in hospital room?: Total Help needed climbing 3-5 steps with a railing? : Total 6 Click Score: 6    End of Session   Activity Tolerance: Patient tolerated treatment well Patient left: in bed;with call bell/phone within reach;with family/visitor present Nurse Communication: Mobility status PT Visit Diagnosis: Muscle weakness (generalized) (M62.81)    Time: [id number] PT Time Calculation (min) (ACUTE ONLY): 27 min   Charges:   PT Evaluation $PT Eval Moderate Complexity: 1 Mod         D. Scott Ellina Sivertsen PT, DPT 07/12/22, 3:33 PM

## 2022-07-12 NOTE — Inpatient Diabetes Management (Signed)
Inpatient Diabetes Program Recommendations  AACE/ADA: New Consensus Statement on Inpatient Glycemic Control (2015)  Target Ranges:  Prepandial:   less than 140 mg/dL      Peak postprandial:   less than 180 mg/dL (1-2 hours)      Critically ill patients:  140 - 180 mg/dL    Latest Reference Range & Units 07/12/22 04:06 07/12/22 05:02 07/12/22 07:45  Glucose-Capillary 70 - 99 mg/dL 52 (L) 195 (H) 129 (H)    Latest Reference Range & Units 07/11/22 07:42 07/11/22 11:49 07/11/22 15:43 07/11/22 19:33 07/11/22 23:18  Glucose-Capillary 70 - 99 mg/dL 125 (H) 279 (H) 216 (H) 190 (H) 126 (H)   Review of Glycemic Control  Diabetes history: DM2 Outpatient Diabetes medications: NPH 14 units QAM, NPH 8 units QPM, Humalog SSI Current orders for Inpatient glycemic control: Novolog 0-9 units Q4H  Inpatient Diabetes Program Recommendations:    Insulin: If patient is eating well, please consider changing frequency of CBGs to AC&HS, Novolog to 0-9 units TID with meals and Novolog 0-5 units QHS,  Novolog 3 units TID with meals for meal coverage if patient eats at least 50% of meals.  Thanks, Barnie Alderman, RN, MSN, [facility] Coordinator Inpatient Diabetes Program [phone] (Team Pager from 8am to [place])

## 2022-07-12 NOTE — Progress Notes (Signed)
   07/12/22 1300  Spiritual Encounters  Type of Visit Initial  Care provided to: Pt and family  Referral source Chaplain assessment  Reason for visit Routine spiritual support  OnCall Visit No  Spiritual Framework  Community/Connection Family  Patient Stress Factors None identified  Family Stress Factors None identified  Interventions  Spiritual Care Interventions Made Established relationship of care and support;Reflective listening;Prayer  Intervention Outcomes  Outcomes Reduced anxiety;Reduced fear  Spiritual Care Plan  Spiritual Care Issues Still Outstanding Chaplain will continue to follow   Spoke to Patients daughter and she ask me to pray for her mother. Pray for patient and daughter advise them that [employer] is available.

## 2022-07-12 NOTE — Progress Notes (Signed)
Pts daughter worried about hypoglycemia at night and refused SSI coverage for 1700.

## 2022-07-12 NOTE — Progress Notes (Signed)
[facility] for heparin infusion Indication: ACS/STEMI, PE  Allergies  Allergen Reactions   Amoxicillin-Pot Clavulanate Diarrhea   Hydrochlorothiazide Other (See Comments)    "she got shaky and fell" "she got shaky and fell"     Patient Measurements: Height: '5\' 7"'$  (170.2 cm) Weight: 71.7 kg (158 lb 1.1 oz) IBW/kg (Calculated) : 61.6 Heparin Dosing Weight: 72.6 kg  Vital Signs: Temp: 99 F (37.2 C) (03/12 0000) Temp Source: Rectal (03/12 0000) BP: 126/72 (03/12 0000) Pulse Rate: 78 (03/12 0000)  Labs: Recent Labs    07/09/22 2208 07/10/22 0021 07/10/22 0520 07/10/22 1104 07/10/22 1638 07/10/22 1800 07/10/22 2029 07/10/22 2205 07/11/22 0943 07/11/22 1757 07/12/22 0525  HGB 10.7*  --  11.3*  --   --   --   --   --  11.0*  --  10.2*  HCT 36.1  --  38.3  --   --   --   --   --  36.5  --  33.4*  PLT 223  --  179  --   --   --   --   --  233  --  187  APTT  --  23*  --   --   --   --   --   --   --   --   --   LABPROT  --  14.2 14.9  --   --   --   --   --   --   --   --   INR  --  1.1 1.2  --   --   --   --   --   --   --   --   HEPARINUNFRC  --   --   --    < >  --  0.89*  --    < > 0.54 0.30 0.29*  CREATININE 0.90  --  0.88  --   --   --   --   --  1.22*  --   --   TROPONINIHS 641* 729*  --    < > 727* 670* 564*  --   --   --   --    < > = values in this interval not displayed.     Estimated Creatinine Clearance: 29.8 mL/min (A) (by C-G formula based on SCr of 1.22 mg/dL (H)).   Medical History: Past Medical History:  Diagnosis Date   Arthritis    Asthma    Diabetes mellitus without complication ([place])    Hypertension    Morbid obesity ([place])     Assessment: Pt is a 89 yo female presenting to ED c/o SOB, found with elevated Troponin I level and right lower lobe arterial emboli  -No anticoagulants PTA per med rec   Date Time HL Rate/Comment 3/10 1104  >1.10,  supratherapeutic, hold x 1 hr and resume at 800  units/hr 3/10 1800 0.89 supratherapeutic 3/10 2205 1.04 supratherapeutic 3/11 0943 0.54 therapeutic x 1 3/11 1757 0.30 therapeutic x2 / 650 u/hr 3/12     0525   0.29     SUBtherapeutic @ 650 units/hr  Goal of Therapy:  Heparin level 0.3-0.7 units/ml Monitor platelets by anticoagulation protocol: Yes  Plan:  3/12:  HL @ 0525 = 0.29, SUBtherapeutic  - Will order heparin 1100 units IV X 1 bolus and increase drip rate to 800 units/hr.  - Will recheck HL 8 hrs after rate change Continue to monitor  H&H and platelets daily while on heparin gtt.  Gurleen Larrivee D Clinical Pharmacist 07/12/2022 5:55 AM

## 2022-07-12 NOTE — Consult Note (Signed)
Consultation Note Date: 07/12/2022   Patient Name: Natalie Adams  DOB: 12-Feb-1930  MRN: [id number]  Age / Sex: 89 y.o., female  PCP: Pcp, No Referring Physician: Val Riles, MD  Reason for Consultation: Establishing goals of care  HPI/Patient Profile: 89 y.o. female  with past medical history of DM, HTN, asthma, OA,, obesity admitted on 07/09/2022 with acute metabolic encephalopathy, NSTEMI, acute PE.   Clinical Assessment and Goals of Care: I have reviewed medical records including EPIC notes, labs and imaging, received report from RN, assessed the patient.  Natalie Adams is lying quietly in bed.  She appears acutely/chronically ill and quite frail.  She is resting comfortably, but does not wake when I gently call her name and touch her.  Her daughter, Natalie Adams, is present at bedside.    We made at bedside  to discuss diagnosis prognosis, GOC, EOL wishes, disposition and options.  I introduced Palliative Medicine as specialized medical care for people living with serious illness. It focuses on providing relief from the symptoms and stress of a serious illness. The goal is to improve quality of life for both the patient and the family.  We discussed a brief life review of the patient.  Natalie Adams lived in [place] until 2006.  She divorced when Natalie Adams was young, but her former husband has died.  She worked in Science writer.  She has a son who continues to live in [place].  She moved in to [facility] home Christmas of 2006.  Her goal had been to return to [place], but due to health concerns she stayed in the [place].  Natalie Adams shares that she bought a Civil Service fast streamer off of the Internet and transfers her mother from bed to chair.  She is dependent with ADLs.  And then focused on their current illness.  Overall, Natalie Adams seems to understand her mother's acute illness.  We talk about PE and the treatment plan, heparin  infusion.  We talked about heart strain, likely related to PE, which is treated medically.  We talk about blood in urine, likely rate related to heparin infusion and history of urinary retention.  The natural disease trajectory and expectations at EOL were discussed.  Advanced directives, concepts specific to code status, artifical feeding and hydration, and rehospitalization were considered and discussed.  Natalie Adams verifies DNR.  Hospice and Palliative Care services outpatient were explained and offered.  Natalie Adams shares that approximately 1 year ago she considered connecting Natalie Adams with [facility] but Natalie Adams declined.  Natalie Adams tells me that this past Friday she reached out to [facility] again and expects a phone call signing them up for services within the next few days.  I ask Natalie Adams if she means [facility] but she states that she has been speaking with [facility].  Discussed the importance of continued conversation with family and the medical providers regarding overall plan of care and treatment options, ensuring decisions are within the context of the patient's values and GOCs.  Questions and concerns were addressed. The family was encouraged  to call with questions or concerns.  PMT will continue to support holistically.  Conference with attending, bedside nursing staff, transition of care team related to patient condition, needs, goals of care, disposition.   HCPOA  NEXT OF KIN -daughter, Natalie Adams    SUMMARY OF RECOMMENDATIONS   Continue to treat the treatable but no CPR or intubation Time for outcomes. Anticipate home when stable Daughter is working for at home hospice care.   Code Status/Advance Care Planning: DNR  Symptom Management:  Per hospitalist, no additional needs at this time.   Palliative Prophylaxis:  Frequent Pain Assessment and Oral Care  Additional Recommendations (Limitations, Scope, Preferences): No CPR or  intubation   Psycho-social/Spiritual:  Desire for further Chaplaincy support:no Additional Recommendations: Caregiving  Support/Resources and Education on Hospice  Prognosis:  Unable to determine, based on outcomes. 6 months or less would not be surprising.   Discharge Planning:  anticipate home with "treat the treatable" hospice care.        Primary Diagnoses: Present on Admission:  NSTEMI (non-ST elevated myocardial infarction) [facility])  Essential hypertension  Hyperkalemia   I have reviewed the medical record, interviewed the patient and family, and examined the patient. The following aspects are pertinent.  Past Medical History:  Diagnosis Date   Arthritis    Asthma    Diabetes mellitus without complication (HCC)    Hypertension    Morbid obesity ([place])    Social History   Socioeconomic History   Marital status: Divorced    Spouse name: Not on file   Number of children: Not on file   Years of education: Not on file   Highest education level: Not on file  Occupational History   Not on file  Tobacco Use   Smoking status: Never   Smokeless tobacco: Never  Substance and Sexual Activity   Alcohol use: Not on file   Drug use: Not on file   Sexual activity: Not on file  Other Topics Concern   Not on file  Social History Narrative   Not on file   Social Determinants of Health   Financial Resource Strain: Not on file  Food Insecurity: Not on file  Transportation Needs: Not on file  Physical Activity: Not on file  Stress: Not on file  Social Connections: Not on file   Family History  Problem Relation Age of Onset   Hypertension Mother    Scheduled Meds:  apixaban  10 mg Oral BID   Followed by   Derrill Memo ON 07/19/2022] apixaban  5 mg Oral BID   ascorbic acid  1,000 mg Oral Daily   aspirin EC  81 mg Oral Daily   atorvastatin  80 mg Oral Daily   Chlorhexidine Gluconate Cloth  6 each Topical Q0600   ferrous gluconate  324 mg Oral Daily   insulin aspart  0-9  Units Subcutaneous Q4H   [START ON 07/13/2022] metoprolol succinate  12.5 mg Oral Daily   Continuous Infusions:  sodium chloride     PRN Meds:.acetaminophen, ALPRAZolam, hydrALAZINE **OR** hydrALAZINE, magnesium hydroxide, nitroGLYCERIN, ondansetron (ZOFRAN) IV, polyethylene glycol, traZODone Medications Prior to Admission:  Prior to Admission medications   Medication Sig Start Date End Date Taking? Authorizing Provider  amLODipine (NORVASC) 10 MG tablet Take 5 mg by mouth daily. 05/01/17  Yes [provider]  ascorbic acid (VITAMIN C) 1000 MG tablet Take 1,000 mg by mouth daily.   Yes [provider]  Boswellia-Glucosamine-Vit D (OSTEO BI-FLEX ONE PER DAY PO) Take 1  capsule by mouth daily.   Yes [provider]  Cranberry 300 MG tablet Take 300 mg by mouth 2 (two) times daily.   Yes [provider]  Ferrous Fumarate (IRON) 18 MG TBCR Take 1 tablet (18 mg total) by mouth daily. Can take any over-the-counter iron supplement. 06/09/20  Yes Enzo Bi, MD  insulin lispro (HUMALOG) 100 UNIT/ML injection Sliding scale insulin # of units = (BS-150/50) TID [facility] 05/09/16  Yes [provider]  insulin NPH Human (NOVOLIN N) 100 UNIT/ML injection Inject 8-14 Units into the skin 2 (two) times daily before a meal. 14 units qam 8 units qpm 05/09/16  Yes [provider]  ipratropium-albuterol (DUONEB) 0.5-2.5 (3) MG/3ML SOLN SMARTSIG:1 Ampule(s) Via Nebulizer Every 6 Hours PRN 03/25/20  Yes [provider]  metoprolol tartrate (LOPRESSOR) 25 MG tablet Take 12.5 mg by mouth 2 (two) times daily. 05/08/20  Yes [provider]  Multiple Vitamins tablet Take by mouth. 02/11/11  Yes [provider]  Omega-3 Fatty Acids (FISH OIL) 1200 MG CAPS Take by mouth.   Yes [provider]  pravastatin (PRAVACHOL) 40 MG tablet Take 40 mg by mouth at bedtime. 05/08/20  Yes [provider]  acetaminophen (TYLENOL) 500 MG tablet Take 2 tablets  (1,000 mg total) by mouth 2 (two) times daily as needed for mild pain or moderate pain. 06/09/20   Enzo Bi, MD  Dietary Management Product (ALGESIS) TABS Take by mouth. Patient not taking: Reported on 07/09/2022    [provider]  polyethylene glycol (MIRALAX / GLYCOLAX) 17 g packet Take 17 g by mouth daily. 12/16/21   Loletha Grayer, MD   Allergies  Allergen Reactions   Amoxicillin-Pot Clavulanate Diarrhea   Hydrochlorothiazide Other (See Comments)    "she got shaky and fell" "she got shaky and fell"    Review of Systems  Unable to perform ROS: Age    Physical Exam Vitals and nursing note reviewed.  Constitutional:      General: She is not in acute distress.    Appearance: She is ill-appearing.  Cardiovascular:     Rate and Rhythm: Normal rate.  Pulmonary:     Effort: Pulmonary effort is normal. No tachypnea.  Skin:    General: Skin is warm and dry.     Vital Signs: BP (!) 129/118   Pulse 78   Temp (!) 97.3 F (36.3 C)   Resp (!) 40   Ht '5\' 7"'$  (1.702 m)   Wt 70.2 kg   SpO2 100%   BMI 24.24 kg/m  Pain Scale: PAINAD   Pain Score: Asleep   SpO2: SpO2: 100 % O2 Device:SpO2: 100 % O2 Flow Rate: .O2 Flow Rate (L/min): 2 L/min  IO: Intake/output summary:  Intake/Output Summary (Last 24 hours) at 07/12/2022 1222 Last data filed at 07/12/2022 [id number] Gross per 24 hour  Intake 341.53 ml  Output 1550 ml  Net -1208.47 ml    LBM: Last BM Date : 07/10/22 Baseline Weight: Weight: 72.6 kg Most recent weight: Weight: 70.2 kg     Palliative Assessment/Data:     Time In: 0900 Time Out: 1015 Time Total: 75 minutes  Greater than 50%  of this time was spent counseling and coordinating care related to the above assessment and plan.  Signed by: Drue Novel, NP   Please contact Palliative Medicine Team phone at [phone] for questions and concerns.  For individual provider: See Shea Evans

## 2022-07-13 DIAGNOSIS — I5021 Acute systolic (congestive) heart failure: Secondary | ICD-10-CM

## 2022-07-13 DIAGNOSIS — Z515 Encounter for palliative care: Secondary | ICD-10-CM | POA: Diagnosis not present

## 2022-07-13 DIAGNOSIS — J810 Acute pulmonary edema: Secondary | ICD-10-CM | POA: Diagnosis not present

## 2022-07-13 DIAGNOSIS — I214 Non-ST elevation (NSTEMI) myocardial infarction: Secondary | ICD-10-CM | POA: Diagnosis not present

## 2022-07-13 LAB — LIPOPROTEIN A (LPA): Lipoprotein (a): 305.1 nmol/L — ABNORMAL HIGH (ref <75.0)

## 2022-07-13 LAB — BASIC METABOLIC PANEL
Anion gap: 10 (ref 5–15)
BUN: 32 mg/dL — ABNORMAL HIGH (ref 8–23)
CO2: 24 mmol/L (ref 22–32)
Calcium: 8.6 mg/dL — ABNORMAL LOW (ref 8.9–10.3)
Chloride: 103 mmol/L (ref 98–111)
Creatinine, Ser: 1.46 mg/dL — ABNORMAL HIGH (ref 0.44–1.00)
GFR, Estimated: 34 mL/min — ABNORMAL LOW (ref >60)
Glucose, Bld: 173 mg/dL — ABNORMAL HIGH (ref 70–99)
Potassium: 3.7 mmol/L (ref 3.5–5.1)
Sodium: 137 mmol/L (ref 135–145)

## 2022-07-13 LAB — GLUCOSE, CAPILLARY
Glucose-Capillary: 136 mg/dL — ABNORMAL HIGH (ref 70–99)
Glucose-Capillary: 153 mg/dL — ABNORMAL HIGH (ref 70–99)
Glucose-Capillary: 162 mg/dL — ABNORMAL HIGH (ref 70–99)
Glucose-Capillary: 189 mg/dL — ABNORMAL HIGH (ref 70–99)
Glucose-Capillary: 190 mg/dL — ABNORMAL HIGH (ref 70–99)

## 2022-07-13 LAB — CBC
HCT: 34.1 % — ABNORMAL LOW (ref 36.0–46.0)
Hemoglobin: 10.3 g/dL — ABNORMAL LOW (ref 12.0–15.0)
MCH: 20.4 pg — ABNORMAL LOW (ref 26.0–34.0)
MCHC: 30.2 g/dL (ref 30.0–36.0)
MCV: 67.7 fL — ABNORMAL LOW (ref 80.0–100.0)
Platelets: 191 10*3/uL (ref 150–400)
RBC: 5.04 MIL/uL (ref 3.87–5.11)
RDW: 17.2 % — ABNORMAL HIGH (ref 11.5–15.5)
WBC: 6.9 10*3/uL (ref 4.0–10.5)
nRBC: 0.7 % — ABNORMAL HIGH (ref 0.0–0.2)

## 2022-07-13 LAB — MAGNESIUM: Magnesium: 1.7 mg/dL (ref 1.7–2.4)

## 2022-07-13 LAB — PHOSPHORUS: Phosphorus: 3.7 mg/dL (ref 2.5–4.6)

## 2022-07-13 NOTE — Progress Notes (Signed)
Physical Therapy Treatment Patient Details Name: Natalie Adams MRN: [id number] DOB: 07-31-1929 Today's Date: 07/13/2022   History of Present Illness Pt is a 89 y.o. African-American female with medical history significant for type diabetes mellitus, hypertension, asthma and osteoarthritis who presented to the emergency room with acute onset of dyspnea and altered mental status with confusion after having a bowel movement. MD assessment includes: acute metabolic encephalopathy possibly secondary to hypoxia and hypertensive emergency, NSTEMI, PE, acute pulmonary edema, hyperkalemia, bilateral hydroureteronephrosis, and pulmonary nodule and mediastinal lymphadenopathy.    PT Comments    Pt very lethargic this session with significant effort needed to get pt to open eyes after which she would return to sleeping.  Pt's daughter in room and requested BLE PROM/stretching with pt tolerating below session well with no signs of distress.  Pt will benefit from HHPT upon discharge to safely address deficits listed in patient problem list for decreased caregiver assistance and eventual return to PLOF.    Recommendations for follow up therapy are one component of a multi-disciplinary discharge planning process, led by the attending physician.  Recommendations may be updated based on patient status, additional functional criteria and insurance authorization.  Follow Up Recommendations  Home health PT     Assistance Recommended at Discharge Frequent or constant Supervision/Assistance  Patient can return home with the following Two people to help with walking and/or transfers;A lot of help with bathing/dressing/bathroom;Assistance with cooking/housework;Assistance with feeding;Direct supervision/assist for medications management;Direct supervision/assist for financial management;Assist for transportation;Help with stairs or ramp for entrance   Equipment Recommendations  None recommended by PT     Recommendations for Other Services       Precautions / Restrictions Precautions Precautions: Fall Restrictions Weight Bearing Restrictions: No     Mobility  Bed Mobility               General bed mobility comments: NT secondary to lethargy    Transfers                        Ambulation/Gait                   Stairs             Wheelchair Mobility    Modified Rankin (Stroke Patients Only)       Balance                                            Cognition Arousal/Alertness: Lethargic Behavior During Therapy: Flat affect Overall Cognitive Status: Difficult to assess                                          Exercises Other Exercises Other Exercises: Supine ankle, knee, and hip PROM in available planes Other Exercises: BLE ankle gentle stretching at DF end range with 30 sec holds BLE positioning to prevent heel pressure    General Comments        Pertinent Vitals/Pain Pain Assessment Pain Assessment: PAINAD Breathing: normal Negative Vocalization: none Facial Expression: smiling or inexpressive Body Language: relaxed Consolability: no need to console PAINAD Score: 0    Home Living  Prior Function            PT Goals (current goals can now be found in the care plan section) Progress towards PT goals: PT to reassess next treatment    Frequency    Min 2X/week      PT Plan Current plan remains appropriate    Co-evaluation              AM-PAC PT "6 Clicks" Mobility   Outcome Measure  Help needed turning from your back to your side while in a flat bed without using bedrails?: Total Help needed moving from lying on your back to sitting on the side of a flat bed without using bedrails?: Total Help needed moving to and from a bed to a chair (including a wheelchair)?: Total Help needed standing up from a chair using your arms (e.g.,  wheelchair or bedside chair)?: Total Help needed to walk in hospital room?: Total Help needed climbing 3-5 steps with a railing? : Total 6 Click Score: 6    End of Session   Activity Tolerance: Patient tolerated treatment well Patient left: in bed;with call bell/phone within reach;with family/visitor present;with bed alarm set Nurse Communication: Mobility status PT Visit Diagnosis: Muscle weakness (generalized) (M62.81)     Time: [id number] PT Time Calculation (min) (ACUTE ONLY): 24 min  Charges:  $Therapeutic Exercise: 8-22 mins                    D. Scott Chevi Lim PT, DPT 07/13/22, 3:41 PM

## 2022-07-13 NOTE — Progress Notes (Signed)
Mia from 2A will call me right back for report.

## 2022-07-13 NOTE — Progress Notes (Signed)
[facility] Progress Note  Patient: Natalie Adams    [id number]  DOA: 07/09/2022     Date of Service: the patient was seen and examined on 07/13/2022  Chief Complaint  Patient presents with   Shortness of Breath   Brief hospital course: Natalie Adams is a 89 y.o. African-American female with medical history significant for type diabetes mellitus, hypertension, asthma and osteoarthritis who presented to the emergency room with acute onset of dyspnea and altered mental status with confusion after having a bowel movement today.  She was hypothermic.  She was not communicative as her usual .  She did not have any reported chest pain. She has mild lower extremity edema without worsening.  No any other symptoms. Ed w/up:  HR 108, RR 41, Tma 96.7 O2 sat 96 on RA,  VBG with pH 7.39, HCO3 32.1 and pCO2 53 and pO2 of 46.  K 5.9 hyperkalemia, SBG 170, troponin 641--729 elevated, BNP 2688 elevated lactic acid 1.7--1.5, Hb 10.7, D-dimer 1.85 elevated   Assessment and Plan:   Acute metabolic encephalopathy could be secondary to hypoxia and hypertensive emergency Continue supportive care Continue fall precautions Continued treatment as below COVID and flu negative, blood culture negative 3/13 alert and awake, AAO x 1  Elevated troponin most likely due to demand ischemia secondary to hypertensive emergency and pulmonary embolism, Less likely Non-STEMI, Patient denies any chest pain. Continue aspirin 81 mg p.o. daily and statin S/p IV heparin gtt, d/c'd on 3/12   Continue nitroglycerin as needed and IV morphine as needed Troponin 729 -- 920 --564, trending down TTE shows LVEF 35 to 40%, moderate global hypokinesis with regional wall motion abnormality, severe hypokinesis of inferior/posterior wall.  Moderate asymmetric LV hypertrophy of septal segment, grade 1 diastolic dysfunction.  RV systolic function is moderately reduced.  Mild pulmonary hypertension.  Moderate MR, mild to moderate AR,  severe aortic valve stenosis. Cardiology consult appreciated, recommended no ischemic workup at this time due to advanced age and poor prognosis, continue heparin for 48 hours and follow TTE   Pulmonary embolism, Elevated D-dimer, CTA chest/a/p: age-indeterminate subsegmental right lower lobe arterial emboli. No large-vessel or further arterial embolus is seen. Prominent pulmonary trunk as before. venous duplex of Lex: negative for DVT S/p heparin IV infusion, on 3/12 transition to Eliquis 10 mg p.o. twice daily for 7 days followed by 5 mg p.o. twice daily   #Acute pulmonary edema It could be secondary to non-STEMI and PE, acute cor pulmonale vs hypertensive emergency BNP elevated, TTE as above consistent with systolic CHF S/p IV Lasix for diuresis, s/p Lasix 40 mg IV BID x 2 dose. 3/11 decrease to Lasix 20 mg IV twice daily due to elevated creatinine level 3/12 CT chest: Bilateral pleural effusions layering dependently, slightly larger on the right than the left, similar to the previous exam. Dependent pulmonary atelectasis, particularly at the right lung base. This could be simple atelectasis or mild atelectatic pneumonia, particularly at the right lung base.  Patient is off and on having shortness of breath, currently saturating well on room air, may not need thoracentesis.  Hypertensive emergency, SBP 212/127 History of hypertension Patient received IV labetalol and IV Lasix -Continue Lopressor 12.5 twice daily with holding parameters Blood pressure dropped after IV labetalol, discontinued amlodipine for now Use hydralazine p.o. versus IV as needed, follow parameters We will continue monitor BP and titrate medications accordingly 3/11 yesterday evening patient's blood pressure dropped transiently so Levophed was ordered but never initiated.  Patient was  transferred to stepdown for close monitoring.  We can transfer her back to cardiac telemetry.   Hyperkalemia, resolved - s/p D50,  insulin and IV calcium gluconate. - Potassium 4.8, hyperkalemia resolved K3.7   Type 2 diabetes mellitus without complication, with long-term current use of insulin (HCC) - Developed hypoglycemia on 3/10 Follow hypoglycemia protocol Discontinued scheduled Novolin Continue NovoLog sliding scale Monitor CBG Continue diabetic diet Diabetic counselor consulted   Bilateral hydroureteronephrosis without filling defect CT scan reviewed US renal: hick-walled appearance of the urinary bladder.  Mild left hydronephrosis.  No right hydronephrosis. Mildly echogenic kidneys.  Bilateral effusions Urology consulted for further recommendation  Gross hematuria, most likely due to heparin infusion Hb 11.3--10.3 Monitor H&H and transfuse if hemoglobin less than 7 Urology following   # Pulmonary nodule and mediastinal lymphadenopathy, incidental finding CTA: Mediastinal lymphadenopathy measuring up to 1.2 cm in short axis is favored to be reactive. Right upper lobe pulmonary nodule measuring 8 mm. Non-contrast chest CT at 6-12 months is recommended. If the nodule is stable at time of repeat CT, then future CT at 18-24 months Recommended to follow with PCP.   Body mass index is 25.06 kg/m.  Interventions:   Pressure Injury 12/12/21 Buttocks Left Stage 2 -  Partial thickness loss of dermis presenting as a shallow open injury with a red, pink wound bed without slough. (Active)  12/12/21 1812  Location: Buttocks  Location Orientation: Left  Staging: Stage 2 -  Partial thickness loss of dermis presenting as a shallow open injury with a red, pink wound bed without slough.  Wound Description (Comments):   Present on Admission: Yes     Pressure Injury 07/10/22 Sacrum Stage 2 -  Partial thickness loss of dermis presenting as a shallow open injury with a red, pink wound bed without slough. (Active)  07/10/22 1800  Location: Sacrum  Location Orientation:   Staging: Stage 2 -  Partial thickness loss  of dermis presenting as a shallow open injury with a red, pink wound bed without slough.  Wound Description (Comments):   Present on Admission: Yes  Dressing Type Foam - Lift dressing to assess site every shift 07/13/22 0700     Diet: Diabetic diet DVT Prophylaxis: Therapeutic Anticoagulation with heparin IV infusion    Advance goals of care discussion: DNR  Family Communication: family was present at bedside, at the time of interview.  The pt provided permission to discuss medical plan with the family. Opportunity was given to ask question and all questions were answered satisfactorily.  Discussed with patient's daughter at bedside, palliative care was consulted.  Plan is to discharge home with hospice care when stable.   Disposition:  Pt is from Home, admitted with AMS, elevated troponin, PE, pulmonary edema, respiratory failure, s/p heparin infusion, stable to discharge home with hospice services.  TOC is following for discharge planning   Subjective: No significant events overnight, patient seems without any acute distress, she was resting comfortably, stated that she is feeling fine, AAO x 1. Patient daughter was at bedside, she agreed with the plan to discharge her with hospice services at home.   Physical Exam: General: NAD, lying comfortably Appear in no distress, affect appropriate Eyes: PERRLA ENT: Oral Mucosa Clear, moist  Neck: no JVD,  Cardiovascular: S1 and S2 Present, no Murmur,  Respiratory: good respiratory effort, Bilateral Air entry equal and Decreased, bibasilar Crackles, no wheezes Abdomen: Bowel Sound present, Soft and no tenderness,  Skin: no rashes Extremities: no Pedal edema, no calf  tenderness Neurologic: without any new focal findings Gait not checked due to patient safety concerns  Vitals:   07/13/22 0800 07/13/22 0858 07/13/22 0900 07/13/22 1108  BP: (!) 160/61 118/76 118/76 (!) 153/74  Pulse: 87 78 77 88  Resp: 19 (!) 22 (!) 25 (!) 22  Temp: (!)  97.3 F (36.3 C) (!) 97.2 F (36.2 C) (!) 97.2 F (36.2 C) (!) 97.4 F (36.3 C)  TempSrc:    Oral  SpO2: 100% 100% 100% 100%  Weight:      Height:        Intake/Output Summary (Last 24 hours) at 07/13/2022 1450 Last data filed at 07/13/2022 1345 Gross per 24 hour  Intake 540 ml  Output 850 ml  Net -310 ml   Filed Weights   07/10/22 1423 07/12/22 0500 07/13/22 0219  Weight: 71.7 kg 70.2 kg 70.2 kg    Data Reviewed: I have personally reviewed and interpreted daily labs, tele strips, imagings as discussed above. I reviewed all nursing notes, pharmacy notes, vitals, pertinent old records I have discussed plan of care as described above with RN and patient/family.  CBC: Recent Labs  Lab 07/09/22 2208 07/10/22 0520 07/11/22 0943 07/12/22 0525 07/13/22 0618  WBC 7.8 8.1 8.3 10.3 6.9  NEUTROABS 5.2  --   --   --   --   HGB 10.7* 11.3* 11.0* 10.2* 10.3*  HCT 36.1 38.3 36.5 33.4* 34.1*  MCV 68.1* 67.3* 66.5* 66.7* 67.7*  PLT 223 179 233 187 [id number]   Basic Metabolic Panel: Recent Labs  Lab 07/09/22 2208 07/10/22 0520 07/10/22 1104 07/11/22 0943 07/12/22 0525 07/13/22 0618  NA 136 136  --  138 137 137  K 5.9* 4.8  --  3.7 3.2* 3.7  CL 104 102  --  100 101 103  CO2 24 26  --  '29 30 24  '$ GLUCOSE 170* 230*  --  184* 158* 173*  BUN 23 24*  --  26* 29* 32*  CREATININE 0.90 0.88  --  1.22* 1.40* 1.46*  CALCIUM 8.9 9.1  --  8.9 8.3* 8.6*  MG  --   --  1.9 1.7 1.7 1.7  PHOS  --   --  5.0* 4.1 3.8 3.7    Studies: No results found.  Scheduled Meds:  apixaban  10 mg Oral BID   Followed by   Derrill Memo ON 07/19/2022] apixaban  5 mg Oral BID   ascorbic acid  1,000 mg Oral Daily   aspirin EC  81 mg Oral Daily   atorvastatin  80 mg Oral Daily   Chlorhexidine Gluconate Cloth  6 each Topical Q0600   ferrous gluconate  324 mg Oral Daily   insulin aspart  0-9 Units Subcutaneous Q4H   metoprolol succinate  12.5 mg Oral Daily   Continuous Infusions:  sodium chloride     PRN Meds:  acetaminophen, ALPRAZolam, hydrALAZINE **OR** hydrALAZINE, magnesium hydroxide, nitroGLYCERIN, ondansetron (ZOFRAN) IV, polyethylene glycol, traZODone  Time spent: 35 minutes  Author: Val Riles. MD [facility] 07/13/2022 2:50 PM  To reach On-call, see care teams to locate the attending and reach out to them via [email or web address]. If 7PM-7AM, please contact night-coverage If you still have difficulty reaching the attending provider, please page the [facility] (Director on Call) for [facility] on amion for assistance.

## 2022-07-13 NOTE — Progress Notes (Signed)
[facility])  Referral received from TOC/[place]. ACC to follow up with patient/family on 3.14 to facilitate Hospice Services.     Please call with any questions/concerns.    Thank you for the opportunity to participate in this patient's care.   Phillis Haggis, MSW [facility] Liaison  [phone]

## 2022-07-13 NOTE — Progress Notes (Signed)
Palliative:   [name] is lying quietly in bed.  She appears acutely/chronically ill and elderly.  She is resting comfortably, and does not wake easily to gentle voice or touch.  She is interactive with staff at times and can make her needs known.  Her daughter, Natalie Adams, is present at bedside.  We talk about [name]'s acute health concerns, stopping of heparin infusion, and the plan for PE treatment.  Natalie Adams shares that she has spoken with her contact at "[facility]" at [phone].  She does tell me today that this is through [facility].  She continues to be agreeable to at home "treat the treatable" hospice care.  She states in particular she needs primary care visits in the home.  She shares her struggle in finding at home providers and the difficulty in getting [name] out to appointments.  Conference refrigerator for with attending, bedside nursing staff, transition of care team related to patient condition, needs, goals of care, disposition.  Plan: Continue to treat the treatable but no CPR or intubation.  Home with home health services as needed.  Agreeable to "treat the treatable" hospice care.  75 minutes  Quinn Axe, NP Palliative Medicine Team  Team Phone [phone] Greater than 50% of this time was spent counseling and coordinating care related to the above assessment and plan.

## 2022-07-13 NOTE — Progress Notes (Signed)
Report given to [facility] [facility] RN. Called [name] patients daughter to inform her patient is moving to room 239.

## 2022-07-13 NOTE — TOC Initial Note (Addendum)
Transition of Care [facility]) - Initial/Assessment Note    Patient Details  Name: Natalie Adams MRN: [id number] Date of Birth: 03-06-1930  Transition of Care [facility]) CM/SW Contact:    Laurena Slimmer, RN Phone Number: 07/13/2022, 4:33 PM  Clinical Narrative:                 Spoke with patient's daughter. She stated patient daughter is agreeable to hospice. She was given choices for [facility], [facility], and [facility]. Her daughter does not have a choice other than who ever is in network with her insurance. Patient's daughter stated she would like [facility]. She was advised this is patient's insurance plan not a hospice provider. She left the number to call [facility] @ [phone].   Referral sent to [facility] at [facility].         Patient Goals and CMS Choice            Expected Discharge Plan and Services                                              Prior Living Arrangements/Services                       Activities of Daily Living      Permission Sought/Granted                  Emotional Assessment              Admission diagnosis:  NSTEMI (non-ST elevated myocardial infarction) [facility]) [I21.4] Patient Active Problem List   Diagnosis Date Noted   Acute HFrEF (heart failure with reduced ejection fraction) ([place]) 07/12/2022   NSTEMI (non-ST elevated myocardial infarction) ([place]) 07/10/2022   Elevated d-dimer 07/10/2022   Acute pulmonary edema ([place]) 07/10/2022   Hypomagnesemia 12/14/2021   Pressure injury of skin 12/13/2021   Diarrhea 12/13/2021   Stercoral colitis 12/12/2021   Essential hypertension 12/12/2021   Type 2 diabetes mellitus without complication, with long-term current use of insulin ([place]) 12/12/2021   Dyslipidemia 12/12/2021   Dysphagia [id number]   Acute metabolic encephalopathy [id number]   Altered mental status    Dehydration    Sepsis secondary to UTI ([place]) 10/09/2020   Acute urinary retention 10/09/2020    AKI (acute kidney injury) ([place])    Hyperkalemia    Fracture of distal femur ([place]) 06/05/2020   Accidental fall from bed, initial encounter 06/05/2020   Preoperative clearance 06/05/2020   Femur fracture, left ([place]) 06/05/2020   Pulmonary hypertension ([place]) 07/21/2017   Aortic stenosis 07/21/2017   Bilateral hearing loss 01/24/2015   Hypertension 04/30/2010   Diabetes mellitus without complication ([place]) [id number]   PCP:  Merryl Hacker No Pharmacy:   [facility] [address]), [place] [address] [place]) [place] [address] Phone: [phone] Fax: [phone]     Social Determinants of Health (SDOH) Social History: SDOH Screenings   Tobacco Use: Low Risk  (07/12/2022)   SDOH Interventions:     Readmission Risk Interventions     No data to display

## 2022-07-13 NOTE — Progress Notes (Signed)
OT Cancellation Note  Patient Details Name: Natalie Adams MRN: [id number] DOB: [birth year]   Cancelled Treatment:    Reason Eval/Treat Not Completed: Other (comment). Chart reviewed, upon arrival PT at bedside with family, pt participating in bed level ROM. Will re-attempt at later time/date as able.   Dessie Coma, M.S. OTR/L  07/13/22, 3:23 PM  ascom [phone]

## 2022-07-14 DIAGNOSIS — Z7189 Other specified counseling: Secondary | ICD-10-CM | POA: Diagnosis not present

## 2022-07-14 DIAGNOSIS — Z515 Encounter for palliative care: Secondary | ICD-10-CM | POA: Diagnosis not present

## 2022-07-14 DIAGNOSIS — I214 Non-ST elevation (NSTEMI) myocardial infarction: Secondary | ICD-10-CM | POA: Diagnosis not present

## 2022-07-14 DIAGNOSIS — I5021 Acute systolic (congestive) heart failure: Secondary | ICD-10-CM | POA: Diagnosis not present

## 2022-07-14 LAB — CBC
HCT: 35.9 % — ABNORMAL LOW (ref 36.0–46.0)
Hemoglobin: 10.8 g/dL — ABNORMAL LOW (ref 12.0–15.0)
MCH: 20 pg — ABNORMAL LOW (ref 26.0–34.0)
MCHC: 30.1 g/dL (ref 30.0–36.0)
MCV: 66.5 fL — ABNORMAL LOW (ref 80.0–100.0)
Platelets: 235 10*3/uL (ref 150–400)
RBC: 5.4 MIL/uL — ABNORMAL HIGH (ref 3.87–5.11)
RDW: 17.5 % — ABNORMAL HIGH (ref 11.5–15.5)
WBC: 6.1 10*3/uL (ref 4.0–10.5)
nRBC: 0.5 % — ABNORMAL HIGH (ref 0.0–0.2)

## 2022-07-14 LAB — CULTURE, BLOOD (ROUTINE X 2)
Culture: NO GROWTH
Culture: NO GROWTH
Special Requests: ADEQUATE
Special Requests: ADEQUATE

## 2022-07-14 LAB — GLUCOSE, CAPILLARY
Glucose-Capillary: 131 mg/dL — ABNORMAL HIGH (ref 70–99)
Glucose-Capillary: 114 mg/dL — ABNORMAL HIGH (ref 70–99)
Glucose-Capillary: 161 mg/dL — ABNORMAL HIGH (ref 70–99)
Glucose-Capillary: 157 mg/dL — ABNORMAL HIGH (ref 70–99)
Glucose-Capillary: 128 mg/dL — ABNORMAL HIGH (ref 70–99)

## 2022-07-14 LAB — BASIC METABOLIC PANEL
Anion gap: 7 (ref 5–15)
BUN: 31 mg/dL — ABNORMAL HIGH (ref 8–23)
CO2: 26 mmol/L (ref 22–32)
Calcium: 8.7 mg/dL — ABNORMAL LOW (ref 8.9–10.3)
Chloride: 105 mmol/L (ref 98–111)
Creatinine, Ser: 1.15 mg/dL — ABNORMAL HIGH (ref 0.44–1.00)
GFR, Estimated: 45 mL/min — ABNORMAL LOW (ref >60)
Glucose, Bld: 103 mg/dL — ABNORMAL HIGH (ref 70–99)
Potassium: 3.2 mmol/L — ABNORMAL LOW (ref 3.5–5.1)
Sodium: 138 mmol/L (ref 135–145)

## 2022-07-14 MED ORDER — POTASSIUM CHLORIDE CRYS ER 20 MEQ PO TBCR
40.0000 meq | EXTENDED_RELEASE_TABLET | ORAL | Status: AC
  Administered 2022-07-14 (×2): 40 meq via ORAL
  Filled 2022-07-14 (×2): qty 2

## 2022-07-14 NOTE — Progress Notes (Signed)
[facility] Progress Note  Patient: Natalie Adams    [id number]  DOA: 07/09/2022     Date of Service: the patient was seen and examined on 07/14/2022  Chief Complaint  Patient presents with   Shortness of Breath   Brief hospital course: Greidy Logwood is a 89 y.o. African-American female with medical history significant for type diabetes mellitus, hypertension, asthma and osteoarthritis who presented to the emergency room with acute onset of dyspnea and altered mental status with confusion after having a bowel movement today.  She was hypothermic.  She was not communicative as her usual .  She did not have any reported chest pain. She has mild lower extremity edema without worsening.  No any other symptoms. Ed w/up:  HR 108, RR 41, Tma 96.7 O2 sat 96 on RA,  VBG with pH 7.39, HCO3 32.1 and pCO2 53 and pO2 of 46.  K 5.9 hyperkalemia, SBG 170, troponin 641--729 elevated, BNP 2688 elevated lactic acid 1.7--1.5, Hb 10.7, D-dimer 1.85 elevated   Assessment and Plan:  Hypothermia developed on 3/14, we will continue Bair hugger and monitor temperature   Acute metabolic encephalopathy could be secondary to hypoxia and hypertensive emergency Continue supportive care Continue fall precautions Continued treatment as below COVID and flu negative, blood culture negative 3/14 alert and awake, AAO x 1  Elevated troponin most likely due to demand ischemia secondary to hypertensive emergency and pulmonary embolism, Less likely Non-STEMI, Patient denies any chest pain. Continue aspirin 81 mg p.o. daily and statin S/p IV heparin gtt, d/c'd on 3/12   Continue nitroglycerin as needed and IV morphine as needed Troponin 729 -- 920 --564, trending down TTE shows LVEF 35 to 40%, moderate global hypokinesis with regional wall motion abnormality, severe hypokinesis of inferior/posterior wall.  Moderate asymmetric LV hypertrophy of septal segment, grade 1 diastolic dysfunction.  RV systolic function is  moderately reduced.  Mild pulmonary hypertension.  Moderate MR, mild to moderate AR, severe aortic valve stenosis. Cardiology consult appreciated, recommended no ischemic workup at this time due to advanced age and poor prognosis, continue heparin for 48 hours and follow TTE   Pulmonary embolism, Elevated D-dimer, CTA chest/a/p: age-indeterminate subsegmental right lower lobe arterial emboli. No large-vessel or further arterial embolus is seen. Prominent pulmonary trunk as before. venous duplex of Lex: negative for DVT S/p heparin IV infusion, on 3/12 transition to Eliquis 10 mg p.o. twice daily for 7 days followed by 5 mg p.o. twice daily   #Acute pulmonary edema It could be secondary to non-STEMI and PE, acute cor pulmonale vs hypertensive emergency BNP elevated, TTE as above consistent with systolic CHF S/p IV Lasix for diuresis, s/p Lasix 40 mg IV BID x 2 dose. 3/11 decrease to Lasix 20 mg IV twice daily due to elevated creatinine level 3/12 CT chest: Bilateral pleural effusions layering dependently, slightly larger on the right than the left, similar to the previous exam. Dependent pulmonary atelectasis, particularly at the right lung base. This could be simple atelectasis or mild atelectatic pneumonia, particularly at the right lung base.  Patient is off and on having shortness of breath, currently saturating well on room air, may not need thoracentesis.  Hypertensive emergency, SBP 212/127 History of hypertension Patient received IV labetalol and IV Lasix -Continue Lopressor 12.5 twice daily with holding parameters Blood pressure dropped after IV labetalol, discontinued amlodipine for now Use hydralazine p.o. versus IV as needed, follow parameters We will continue monitor BP and titrate medications accordingly 3/11 yesterday evening patient's blood  pressure dropped transiently so Levophed was ordered but never initiated.  Patient was transferred to stepdown for close monitoring.   Pt was transfer back to cardiac telemetry.   Hyperkalemia, resolved - s/p D50, insulin and IV calcium gluconate. - Potassium 4.8, hyperkalemia resolved Hypokalemia, potassium 3.2, potassium repleted. Monitor electrolytes and manage accordingly.   Type 2 diabetes mellitus without complication, with long-term current use of insulin (HCC) - Developed hypoglycemia on 3/10 Follow hypoglycemia protocol Discontinued scheduled Novolin Continue NovoLog sliding scale Monitor CBG Continue diabetic diet Diabetic counselor consulted   Bilateral hydroureteronephrosis without filling defect CT scan reviewed US renal: hick-walled appearance of the urinary bladder.  Mild left hydronephrosis.  No right hydronephrosis. Mildly echogenic kidneys.  Bilateral effusions Urology consulted for further recommendation  Gross hematuria, most likely due to heparin infusion Hb 11.3--10.8 Monitor H&H and transfuse if hemoglobin less than 7 Urology following   # Pulmonary nodule and mediastinal lymphadenopathy, incidental finding CTA: Mediastinal lymphadenopathy measuring up to 1.2 cm in short axis is favored to be reactive. Right upper lobe pulmonary nodule measuring 8 mm. Non-contrast chest CT at 6-12 months is recommended. If the nodule is stable at time of repeat CT, then future CT at 18-24 months Recommended to follow with PCP.   Body mass index is 25.06 kg/m.  Interventions:   Pressure Injury 12/12/21 Buttocks Left Stage 2 -  Partial thickness loss of dermis presenting as a shallow open injury with a red, pink wound bed without slough. (Active)  12/12/21 1812  Location: Buttocks  Location Orientation: Left  Staging: Stage 2 -  Partial thickness loss of dermis presenting as a shallow open injury with a red, pink wound bed without slough.  Wound Description (Comments):   Present on Admission: Yes     Pressure Injury 07/10/22 Sacrum Stage 2 -  Partial thickness loss of dermis presenting as a  shallow open injury with a red, pink wound bed without slough. (Active)  07/10/22 1800  Location: Sacrum  Location Orientation:   Staging: Stage 2 -  Partial thickness loss of dermis presenting as a shallow open injury with a red, pink wound bed without slough.  Wound Description (Comments):   Present on Admission: Yes  Dressing Type Foam - Lift dressing to assess site every shift 07/13/22 1955     Diet: Diabetic diet DVT Prophylaxis: Therapeutic Anticoagulation with heparin IV infusion    Advance goals of care discussion: DNR  Family Communication: family was present at bedside, at the time of interview.  The pt provided permission to discuss medical plan with the family. Opportunity was given to ask question and all questions were answered satisfactorily.  Discussed with patient's daughter at bedside, palliative care was consulted.  Plan is to discharge home with hospice care when stable.   Disposition:  Pt is from Home, admitted with AMS, elevated troponin, PE, pulmonary edema, respiratory failure, s/p heparin infusion, developed hypothermia on 3/14, otherwise patient was stable.  We will discharge her home with hospice services most likely tomorrow AM.   TOC is following for discharge planning   Subjective: No significant events overnight, in the morning team noticed that patient was hypothermic and she was on [facility].  Patient was laying without any acute distress, resting comfortably.  AAO x 1.   Physical Exam: General: NAD, lying comfortably Appear in no distress, affect appropriate Eyes: PERRLA ENT: Oral Mucosa Clear, moist  Neck: no JVD,  Cardiovascular: S1 and S2 Present, no Murmur,  Respiratory: good respiratory effort,  Bilateral Air entry equal and Decreased, bibasilar Crackles, no wheezes Abdomen: Bowel Sound present, Soft and no tenderness,  Skin: no rashes Extremities: no Pedal edema, no calf tenderness Neurologic: without any new focal findings Gait not  checked due to patient safety concerns  Vitals:   07/14/22 0452 07/14/22 0814 07/14/22 0927 07/14/22 1200  BP:   (!) 120/58 (!) 122/52  Pulse:  86 84 98  Resp:    20  Temp:  (!) 95.9 F (35.5 C)  (!) 96.9 F (36.1 C)  TempSrc:  Axillary  Axillary  SpO2:  99%  98%  Weight: 67.5 kg     Height:        Intake/Output Summary (Last 24 hours) at 07/14/2022 1400 Last data filed at 07/14/2022 1121 Gross per 24 hour  Intake 120 ml  Output 300 ml  Net -180 ml   Filed Weights   07/12/22 0500 07/13/22 0219 07/14/22 0452  Weight: 70.2 kg 70.2 kg 67.5 kg    Data Reviewed: I have personally reviewed and interpreted daily labs, tele strips, imagings as discussed above. I reviewed all nursing notes, pharmacy notes, vitals, pertinent old records I have discussed plan of care as described above with RN and patient/family.  CBC: Recent Labs  Lab 07/09/22 2208 07/10/22 0520 07/11/22 0943 07/12/22 0525 07/13/22 0618 07/14/22 0807  WBC 7.8 8.1 8.3 10.3 6.9 6.1  NEUTROABS 5.2  --   --   --   --   --   HGB 10.7* 11.3* 11.0* 10.2* 10.3* 10.8*  HCT 36.1 38.3 36.5 33.4* 34.1* 35.9*  MCV 68.1* 67.3* 66.5* 66.7* 67.7* 66.5*  PLT 223 179 233 187 191 [id number]   Basic Metabolic Panel: Recent Labs  Lab 07/10/22 0520 07/10/22 1104 07/11/22 0943 07/12/22 0525 07/13/22 0618 07/14/22 0807  NA 136  --  138 137 137 138  K 4.8  --  3.7 3.2* 3.7 3.2*  CL 102  --  100 101 103 105  CO2 26  --  '29 30 24 26  '$ GLUCOSE 230*  --  184* 158* 173* 103*  BUN 24*  --  26* 29* 32* 31*  CREATININE 0.88  --  1.22* 1.40* 1.46* 1.15*  CALCIUM 9.1  --  8.9 8.3* 8.6* 8.7*  MG  --  1.9 1.7 1.7 1.7  --   PHOS  --  5.0* 4.1 3.8 3.7  --     Studies: No results found.  Scheduled Meds:  apixaban  10 mg Oral BID   Followed by   Derrill Memo ON 07/19/2022] apixaban  5 mg Oral BID   ascorbic acid  1,000 mg Oral Daily   aspirin EC  81 mg Oral Daily   atorvastatin  80 mg Oral Daily   Chlorhexidine Gluconate Cloth  6 each  Topical Q0600   ferrous gluconate  324 mg Oral Daily   insulin aspart  0-9 Units Subcutaneous Q4H   metoprolol succinate  12.5 mg Oral Daily   Continuous Infusions:  sodium chloride     PRN Meds: acetaminophen, ALPRAZolam, hydrALAZINE **OR** hydrALAZINE, magnesium hydroxide, nitroGLYCERIN, ondansetron (ZOFRAN) IV, polyethylene glycol, traZODone  Time spent: 35 minutes  Author: Val Riles. MD [facility] 07/14/2022 2:00 PM  To reach On-call, see care teams to locate the attending and reach out to them via [email or web address]. If 7PM-7AM, please contact night-coverage If you still have difficulty reaching the attending provider, please page the [facility] (Director on Call) for [facility] on amion for assistance.

## 2022-07-14 NOTE — Progress Notes (Addendum)
Manufacturing systems engineer Liaison Referral Note  Received request from Mariea Clonts, [facility], for hospice services at home after discharge. Spoke with Carlena Sax, patient's daughter to initiate education related to hospice philosophy, services, and team approach to care. Patient/family verbalized understanding of information given. Per discussion, the plan is for discharge home when hospital is ready.  Information sent to Referral Intake to initiate services upon hospital discharge.   DME needs discussed. Patient has the following equipment in the home:  Wheel chair, BSC, bed rails, hoyer lift, 2 wheeled walker. Pt/family requests the following equipment for delivery:  [facility] RN to assess at admission visit.                   The address has been verified and is correct in the chart. Pharmacy used:  [facility]  Please send signed and completed DNR home with patient/family if applicable.  Please provide prescriptions at discharge as needed to ensure ongoing symptom management.  [facility] information and contact numbers given to patient's daughter, Verbal Holsten. Continued collaboration with patient/family and [facility] staff is ongoing through final disposition.  Please call with any hospice related questions or concerns. Thank you for the opportunity to participate in this patient's care.  Dimas Aguas, RN Nurse Liaison [phone]

## 2022-07-14 NOTE — Progress Notes (Signed)
Palliative: Natalie Adams is lying quietly in bed.  She appears acutely/chronically ill and quite frail, obese.  She is more alert this morning, making but not really keeping eye contact.  I am not sure that she can make her basic needs known.  There is no family at bedside at this time.  Bedside nursing staff is present attending to needs and giving medications.  As we are visiting attending arrives.  We talk about hypothermia overnight and monitoring blood sugars.  Natalie Adams will stay in the hospital another day.  Her daughter, [name], is working to get her home with the benefits of hospice care.  Call to daughter, Jeweline Breitling. We talk about her mother's acute and chronic health concerns.  We talk about her low body temperature and warming blanket.  I share that she will stay in at least another day in the hospital for continued monitoring.  [facility] is working with [name] for caring for Natalie Adams at home.  Conference with attending, bedside nursing staff, transition of care team related to patient condition, needs, goals of care, disposition.  Plan: Continue to treat the treatable but no CPR or intubation.  Home with the benefits of [facility] hospice for "treat the treatable" hospice care.  48 minutes    Quinn Axe, NP Palliative medicine team Team phone [phone] Greater than 50% of this time was spent counseling and coordinating care related to the above assessment and plan.

## 2022-07-14 NOTE — Progress Notes (Signed)
Occupational Therapy Treatment Patient Details Name: Natalie Adams MRN: [id number] DOB: [birth year] Today's Date: 07/14/2022   History of present illness Pt is a 89 y.o. African-American female with medical history significant for type diabetes mellitus, hypertension, asthma and osteoarthritis who presented to the emergency room with acute onset of dyspnea and altered mental status with confusion after having a bowel movement. MD assessment includes: acute metabolic encephalopathy possibly secondary to hypoxia and hypertensive emergency, NSTEMI, PE, acute pulmonary edema, hyperkalemia, bilateral hydroureteronephrosis, and pulmonary nodule and mediastinal lymphadenopathy.   OT comments  Natalie Adams was seen for OT treatment on this date. Upon arrival to room pt reclined in bed, agreeable to tx. Pt requires MAX A self-feeding at bed level. Tolerated bed level therex as described below, good alertness and participation as pt able. Pt making progress toward goals, will continue to follow POC. Discharge recommendation remains appropriate.     Recommendations for follow up therapy are one component of a multi-disciplinary discharge planning process, led by the attending physician.  Recommendations may be updated based on patient status, additional functional criteria and insurance authorization.    Follow Up Recommendations  Home health OT     Assistance Recommended at Discharge Frequent or constant Supervision/Assistance  Patient can return home with the following  Two people to help with walking and/or transfers;Two people to help with bathing/dressing/bathroom;Help with stairs or ramp for entrance   Equipment Recommendations  None recommended by OT    Recommendations for Other Services      Precautions / Restrictions Precautions Precautions: Fall Restrictions Weight Bearing Restrictions: No       Mobility Bed Mobility Overal bed mobility: Needs Assistance             General bed  mobility comments: TOTAL A to readjust torso and BLE positioning at bed level    Transfers    Not tested                         ADL either performed or assessed with clinical judgement   ADL Overall ADL's : Needs assistance/impaired                                       General ADL Comments: MAX A self-feeding at bed level      Cognition Arousal/Alertness: Awake/alert Behavior During Therapy: Flat affect Overall Cognitive Status: Impaired/Different from baseline Area of Impairment: Following commands, Safety/judgement                       Following Commands: Follows one step commands with increased time Safety/Judgement: Decreased awareness of safety              Exercises Exercises: General Lower Extremity, General Upper Extremity General Exercises - Upper Extremity Shoulder Flexion: AAROM, Strengthening, Supine Shoulder Extension: AAROM, Strengthening, Supine Shoulder ABduction: AAROM, Strengthening, Supine Shoulder ADduction: AAROM, Strengthening, Supine Shoulder Horizontal ABduction: AAROM, Strengthening, Supine Shoulder Horizontal ADduction: AAROM, Strengthening, Supine Elbow Flexion: AAROM, Strengthening, Supine Elbow Extension: AAROM, Strengthening, Supine General Exercises - Lower Extremity Ankle Circles/Pumps: AAROM, Strengthening, Supine Quad Sets: AAROM, Strengthening, Supine Short Arc Quad: AAROM, Strengthening, Supine            Pertinent Vitals/ Pain       Pain Assessment Pain Assessment: PAINAD Breathing: normal Negative Vocalization: occasional moan/groan, low speech, negative/disapproving quality Facial Expression: smiling or inexpressive Body  Language: relaxed Consolability: no need to console PAINAD Score: 1   Frequency  Min 1X/week        Progress Toward Goals  OT Goals(current goals can now be found in the care plan section)  Progress towards OT goals: Progressing toward goals  Acute  Rehab OT Goals Patient Stated Goal: go home OT Goal Formulation: With family Time For Goal Achievement: 07/25/22 Potential to Achieve Goals: Fair ADL Goals Pt Will Perform Grooming: sitting;with min assist Pt Will Perform Lower Body Dressing: with mod assist;bed level Pt Will Transfer to Toilet: bedside commode;with max assist  Plan Discharge plan remains appropriate;Frequency remains appropriate    Co-evaluation                 AM-PAC OT "6 Clicks" Daily Activity     Outcome Measure   Help from another person eating meals?: A Lot Help from another person taking care of personal grooming?: A Lot Help from another person toileting, which includes using toliet, bedpan, or urinal?: Total Help from another person bathing (including washing, rinsing, drying)?: Total Help from another person to put on and taking off regular upper body clothing?: A Lot Help from another person to put on and taking off regular lower body clothing?: Total 6 Click Score: 9    End of Session    OT Visit Diagnosis: Other abnormalities of gait and mobility (R26.89);Muscle weakness (generalized) (M62.81)   Activity Tolerance Patient tolerated treatment well;Patient limited by fatigue   Patient Left in bed;with call bell/phone within reach   Nurse Communication          Time: [id number] OT Time Calculation (min): 11 min  Charges: OT General Charges $OT Visit: 1 Visit OT Treatments $Therapeutic Exercise: 8-22 mins  Dessie Coma, M.S. OTR/L  07/14/22, 11:59 AM  ascom [phone]

## 2022-07-15 DIAGNOSIS — I214 Non-ST elevation (NSTEMI) myocardial infarction: Secondary | ICD-10-CM | POA: Diagnosis not present

## 2022-07-15 LAB — BASIC METABOLIC PANEL
Anion gap: 16 — ABNORMAL HIGH (ref 5–15)
BUN: 36 mg/dL — ABNORMAL HIGH (ref 8–23)
CO2: 22 mmol/L (ref 22–32)
Calcium: 9.3 mg/dL (ref 8.9–10.3)
Chloride: 106 mmol/L (ref 98–111)
Creatinine, Ser: 1.72 mg/dL — ABNORMAL HIGH (ref 0.44–1.00)
GFR, Estimated: 28 mL/min — ABNORMAL LOW (ref >60)
Glucose, Bld: 173 mg/dL — ABNORMAL HIGH (ref 70–99)
Potassium: 5.8 mmol/L — ABNORMAL HIGH (ref 3.5–5.1)
Sodium: 144 mmol/L (ref 135–145)

## 2022-07-15 LAB — CBC
HCT: 37.7 % (ref 36.0–46.0)
Hemoglobin: 11.2 g/dL — ABNORMAL LOW (ref 12.0–15.0)
MCH: 20.2 pg — ABNORMAL LOW (ref 26.0–34.0)
MCHC: 29.7 g/dL — ABNORMAL LOW (ref 30.0–36.0)
MCV: 68.1 fL — ABNORMAL LOW (ref 80.0–100.0)
Platelets: 255 10*3/uL (ref 150–400)
RBC: 5.54 MIL/uL — ABNORMAL HIGH (ref 3.87–5.11)
RDW: 17.6 % — ABNORMAL HIGH (ref 11.5–15.5)
WBC: 7.8 10*3/uL (ref 4.0–10.5)
nRBC: 0.6 % — ABNORMAL HIGH (ref 0.0–0.2)

## 2022-07-15 LAB — GLUCOSE, CAPILLARY
Glucose-Capillary: 131 mg/dL — ABNORMAL HIGH (ref 70–99)
Glucose-Capillary: 172 mg/dL — ABNORMAL HIGH (ref 70–99)
Glucose-Capillary: 185 mg/dL — ABNORMAL HIGH (ref 70–99)
Glucose-Capillary: 195 mg/dL — ABNORMAL HIGH (ref 70–99)
Glucose-Capillary: 205 mg/dL — ABNORMAL HIGH (ref 70–99)
Glucose-Capillary: 263 mg/dL — ABNORMAL HIGH (ref 70–99)
Glucose-Capillary: 314 mg/dL — ABNORMAL HIGH (ref 70–99)

## 2022-07-15 MED ORDER — SODIUM ZIRCONIUM CYCLOSILICATE 10 G PO PACK
10.0000 g | PACK | Freq: Once | ORAL | Status: DC
  Filled 2022-07-15: qty 1

## 2022-07-15 MED ORDER — DEXTROSE-NACL 5-0.45 % IV SOLN: INTRAVENOUS | Status: AC

## 2022-07-15 NOTE — TOC Progression Note (Addendum)
Transition of Care [facility]) - Progression Note    Patient Details  Name: Natalie Adams MRN: [id number] Date of Birth: [birth year]  [facility] CM/SW [place], LCSW Phone Number: 07/15/2022, 3:54 PM  Clinical Narrative:  Left voicemail for daughter. Need to see if she plans on transporting patient by private vehicle tomorrow or if she will need EMS transport.   4:08 pm: Received call back from daughter. Plan for EMS transport. Address on facesheet is correct. She is aware she will likely receive a bill.  Expected Discharge Plan and Services                                               Social Determinants of Health (SDOH) Interventions SDOH Screenings   Tobacco Use: Low Risk  (07/12/2022)    Readmission Risk Interventions     No data to display

## 2022-07-15 NOTE — Progress Notes (Signed)
PT Cancellation Note  Patient Details Name: Natalie Adams MRN: [id number] DOB: 12/14/1929   Cancelled Treatment:    Reason Eval/Treat Not Completed: Other (comment).  Chart reviewed.  Pt's K+ noted to be elevated to 5.8 today.  Per therapy guidelines for elevated potassium, will hold therapy at this time and re-attempt PT session at a later date/time as medically appropriate.  Leitha Bleak, PT 07/15/22, 2:36 PM

## 2022-07-15 NOTE — Progress Notes (Signed)
[facility] Progress Note  Patient: Natalie Adams    [id number]  DOA: 07/09/2022     Date of Service: the patient was seen and examined on 07/15/2022  Chief Complaint  Patient presents with   Shortness of Breath   Brief hospital course: Natalie Adams is a 89 y.o. African-American female with medical history significant for type diabetes mellitus, hypertension, asthma and osteoarthritis who presented to the emergency room with acute onset of dyspnea and altered mental status with confusion after having a bowel movement today.  She was hypothermic.  She was not communicative as her usual .  She did not have any reported chest pain. She has mild lower extremity edema without worsening.  No any other symptoms. Ed w/up:  HR 108, RR 41, Tma 96.7 O2 sat 96 on RA,  VBG with pH 7.39, HCO3 32.1 and pCO2 53 and pO2 of 46.  K 5.9 hyperkalemia, SBG 170, troponin 641--729 elevated, BNP 2688 elevated lactic acid 1.7--1.5, Hb 10.7, D-dimer 1.85 elevated   Assessment and Plan:  Hypothermia developed on 3/14, we will continue Bair hugger and monitor temperature   Acute metabolic encephalopathy could be secondary to hypoxia and hypertensive emergency Continue supportive care Continue fall precautions Continued treatment as below COVID and flu negative, blood culture negative 3/14 alert and awake, AAO x 1  Elevated troponin most likely due to demand ischemia secondary to hypertensive emergency and pulmonary embolism, Less likely Non-STEMI, Patient denies any chest pain. Continue aspirin 81 mg p.o. daily and statin S/p IV heparin gtt, d/c'd on 3/12   Continue nitroglycerin as needed and IV morphine as needed Troponin 729 -- 920 --564, trending down TTE shows LVEF 35 to 40%, moderate global hypokinesis with regional wall motion abnormality, severe hypokinesis of inferior/posterior wall.  Moderate asymmetric LV hypertrophy of septal segment, grade 1 diastolic dysfunction.  RV systolic function is  moderately reduced.  Mild pulmonary hypertension.  Moderate MR, mild to moderate AR, severe aortic valve stenosis. Cardiology consult appreciated, recommended no ischemic workup at this time due to advanced age and poor prognosis, continue heparin for 48 hours and follow TTE   Pulmonary embolism, Elevated D-dimer, CTA chest/a/p: age-indeterminate subsegmental right lower lobe arterial emboli. No large-vessel or further arterial embolus is seen. Prominent pulmonary trunk as before. venous duplex of Lex: negative for DVT S/p heparin IV infusion, on 3/12 transition to Eliquis 10 mg p.o. twice daily for 7 days followed by 5 mg p.o. twice daily   #Acute pulmonary edema It could be secondary to non-STEMI and PE, acute cor pulmonale vs hypertensive emergency BNP elevated, TTE as above consistent with systolic CHF S/p IV Lasix for diuresis, s/p Lasix 40 mg IV BID x 2 dose. 3/11 decrease to Lasix 20 mg IV twice daily due to elevated creatinine level 3/12 CT chest: Bilateral pleural effusions layering dependently, slightly larger on the right than the left, similar to the previous exam. Dependent pulmonary atelectasis, particularly at the right lung base. This could be simple atelectasis or mild atelectatic pneumonia, particularly at the right lung base.  Patient is off and on having shortness of breath, currently saturating well on room air, may not need thoracentesis.  Hypertensive emergency, SBP 212/127 History of hypertension Patient received IV labetalol and IV Lasix -Continue Lopressor 12.5 twice daily with holding parameters Blood pressure dropped after IV labetalol, discontinued amlodipine for now Use hydralazine p.o. versus IV as needed, follow parameters We will continue monitor BP and titrate medications accordingly 3/11 yesterday evening patient's blood  pressure dropped transiently so Levophed was ordered but never initiated.  Patient was transferred to stepdown for close monitoring.   Pt was transfer back to cardiac telemetry.  AKI on CKD stage IIIb Creatinine 1.15-->1.72 elevated due to dehydration Started IV fluid for overnight hydration Monitor renal functions daily and urine output   Hyperkalemia,  - s/p D50, insulin and IV calcium gluconate. - Potassium 4.8, hyperkalemia resolved 3/15 K 5.8, again elevated, Lokelma x 1 dose given Hypokalemia, potassium 3.2, potassium repleted. Monitor electrolytes and manage accordingly.   Type 2 diabetes mellitus without complication, with long-term current use of insulin (HCC) - Developed hypoglycemia on 3/10 Follow hypoglycemia protocol Discontinued scheduled Novolin Continue NovoLog sliding scale Monitor CBG Continue diabetic diet Diabetic counselor consulted   Bilateral hydroureteronephrosis without filling defect CT scan reviewed US renal: hick-walled appearance of the urinary bladder.  Mild left hydronephrosis.  No right hydronephrosis. Mildly echogenic kidneys.  Bilateral effusions Urology consulted for further recommendation  Gross hematuria, most likely due to heparin infusion Hb 11.3--10.8--11.2  Monitor H&H and transfuse if hemoglobin less than 7 Urology following   # Pulmonary nodule and mediastinal lymphadenopathy, incidental finding CTA: Mediastinal lymphadenopathy measuring up to 1.2 cm in short axis is favored to be reactive. Right upper lobe pulmonary nodule measuring 8 mm. Non-contrast chest CT at 6-12 months is recommended. If the nodule is stable at time of repeat CT, then future CT at 18-24 months Recommended to follow with PCP.   Body mass index is 25.06 kg/m.  Interventions:   Pressure Injury 12/12/21 Buttocks Left Stage 2 -  Partial thickness loss of dermis presenting as a shallow open injury with a red, pink wound bed without slough. (Active)  12/12/21 1812  Location: Buttocks  Location Orientation: Left  Staging: Stage 2 -  Partial thickness loss of dermis presenting as a shallow  open injury with a red, pink wound bed without slough.  Wound Description (Comments):   Present on Admission: Yes     Pressure Injury 07/10/22 Sacrum Stage 2 -  Partial thickness loss of dermis presenting as a shallow open injury with a red, pink wound bed without slough. (Active)  07/10/22 1800  Location: Sacrum  Location Orientation:   Staging: Stage 2 -  Partial thickness loss of dermis presenting as a shallow open injury with a red, pink wound bed without slough.  Wound Description (Comments):   Present on Admission: Yes  Dressing Type Foam - Lift dressing to assess site every shift 07/14/22 2045     Diet: Diabetic diet DVT Prophylaxis: Therapeutic Anticoagulation with heparin IV infusion    Advance goals of care discussion: DNR  Family Communication: family was present at bedside, at the time of interview.  The pt provided permission to discuss medical plan with the family. Opportunity was given to ask question and all questions were answered satisfactorily.  Discussed with patient's daughter at bedside, palliative care was consulted.  Plan is to discharge home with hospice care when stable.   Disposition:  Pt is from Home, admitted with AMS, elevated troponin, PE, pulmonary edema, respiratory failure, s/p heparin infusion, developed hypothermia on 3/14, otherwise patient was stable.  We will discharge her home with hospice services most likely tomorrow AM.   TOC is following for discharge planning   Subjective: No significant events overnight, patient is AO x 1, stated that she is feeling all right.  Denies any complaints. Patient's daughter was at bedside, management plan discussed.  She wants to treat treatable, so  we will treat hyperkalemia and AKI for now, plan for disposition tomorrow a.m. if remains stable.   Physical Exam: General: NAD, lying comfortably Appear in no distress, affect appropriate Eyes: PERRLA ENT: Oral Mucosa Clear, moist  Neck: no JVD,   Cardiovascular: S1 and S2 Present, no Murmur,  Respiratory: good respiratory effort, Bilateral Air entry equal and Decreased, bibasilar Crackles, no wheezes Abdomen: Bowel Sound present, Soft and no tenderness,  Skin: no rashes Extremities: no Pedal edema, no calf tenderness Neurologic: without any new focal findings Gait not checked due to patient safety concerns  Vitals:   07/15/22 0629 07/15/22 0804 07/15/22 1154 07/15/22 1509  BP:  115/70 114/87 127/63  Pulse:  92 (!) 102 97  Resp:  18 18 18   Temp:  98.6 F (37 C) 98.3 F (36.8 C) 98.5 F (36.9 C)  TempSrc:  Oral Oral Oral  SpO2:  100% 99% 100%  Weight: 66.6 kg     Height:        Intake/Output Summary (Last 24 hours) at 07/15/2022 1538 Last data filed at 07/15/2022 1454 Gross per 24 hour  Intake 360 ml  Output 200 ml  Net 160 ml   Filed Weights   07/13/22 0219 07/14/22 0452 07/15/22 0629  Weight: 70.2 kg 67.5 kg 66.6 kg    Data Reviewed: I have personally reviewed and interpreted daily labs, tele strips, imagings as discussed above. I reviewed all nursing notes, pharmacy notes, vitals, pertinent old records I have discussed plan of care as described above with RN and patient/family.  CBC: Recent Labs  Lab 07/09/22 2208 07/10/22 0520 07/11/22 0943 07/12/22 0525 07/13/22 0618 07/14/22 0807 07/15/22 0424  WBC 7.8   < > 8.3 10.3 6.9 6.1 7.8  NEUTROABS 5.2  --   --   --   --   --   --   HGB 10.7*   < > 11.0* 10.2* 10.3* 10.8* 11.2*  HCT 36.1   < > 36.5 33.4* 34.1* 35.9* 37.7  MCV 68.1*   < > 66.5* 66.7* 67.7* 66.5* 68.1*  PLT 223   < > 233 187 191 235 255   < > = values in this interval not displayed.   Basic Metabolic Panel: Recent Labs  Lab 07/10/22 1104 07/11/22 0943 07/12/22 0525 07/13/22 0618 07/14/22 0807 07/15/22 0424  NA  --  138 137 137 138 144  K  --  3.7 3.2* 3.7 3.2* 5.8*  CL  --  100 101 103 105 106  CO2  --  29 30 24 26 22   GLUCOSE  --  184* 158* 173* 103* 173*  BUN  --  26* 29* 32*  31* 36*  CREATININE  --  1.22* 1.40* 1.46* 1.15* 1.72*  CALCIUM  --  8.9 8.3* 8.6* 8.7* 9.3  MG 1.9 1.7 1.7 1.7  --   --   PHOS 5.0* 4.1 3.8 3.7  --   --     Studies: No results found.  Scheduled Meds:  apixaban  10 mg Oral BID   Followed by   Derrill Memo ON 07/19/2022] apixaban  5 mg Oral BID   ascorbic acid  1,000 mg Oral Daily   aspirin EC  81 mg Oral Daily   atorvastatin  80 mg Oral Daily   Chlorhexidine Gluconate Cloth  6 each Topical Q0600   ferrous gluconate  324 mg Oral Daily   insulin aspart  0-9 Units Subcutaneous Q4H   metoprolol succinate  12.5 mg Oral Daily  sodium zirconium cyclosilicate  10 g Oral Once   Continuous Infusions:  sodium chloride     dextrose 5 % and 0.45% NaCl 100 mL/hr at 07/15/22 1308   PRN Meds: acetaminophen, ALPRAZolam, hydrALAZINE **OR** hydrALAZINE, magnesium hydroxide, nitroGLYCERIN, ondansetron (ZOFRAN) IV, polyethylene glycol, traZODone  Time spent: 35 minutes  Author: Val Riles. MD [facility] 07/15/2022 3:38 PM  To reach On-call, see care teams to locate the attending and reach out to them via [email or web address]. If 7PM-7AM, please contact night-coverage If you still have difficulty reaching the attending provider, please page the [facility] (Director on Call) for [facility] on amion for assistance.

## 2022-07-15 NOTE — Progress Notes (Signed)
Palliative: Chart review completed.  Face-to-face conference with bedside nursing staff.  No needs identified.  At this point Natalie Adams lives in her daughter's home with all needed equipment.  Family is connected with [facility] for at home "treat the treatable" hospice care.  Goals are set up for treat the treatable but no CPR or intubation.  Plan: Continue to treat the treatable but no CPR or intubation.  Home with the benefits of "treat the treatable" hospice care with [facility].  At this point goals are set, no needs identified.  PMT to sign off.  Please reconsult if needed/declines.  No charge Quinn Axe, NP Palliative medicine team Team phone [phone] Greater than 50% of this time was spent counseling and coordinating care related to the above assessment and plan.

## 2022-07-15 NOTE — Plan of Care (Signed)

## 2022-07-16 DIAGNOSIS — I214 Non-ST elevation (NSTEMI) myocardial infarction: Secondary | ICD-10-CM | POA: Diagnosis not present

## 2022-07-16 LAB — BASIC METABOLIC PANEL
Anion gap: 8 (ref 5–15)
BUN: 43 mg/dL — ABNORMAL HIGH (ref 8–23)
CO2: 25 mmol/L (ref 22–32)
Calcium: 9 mg/dL (ref 8.9–10.3)
Chloride: 108 mmol/L (ref 98–111)
Creatinine, Ser: 1.64 mg/dL — ABNORMAL HIGH (ref 0.44–1.00)
GFR, Estimated: 30 mL/min — ABNORMAL LOW (ref >60)
Glucose, Bld: 246 mg/dL — ABNORMAL HIGH (ref 70–99)
Potassium: 4.8 mmol/L (ref 3.5–5.1)
Sodium: 141 mmol/L (ref 135–145)

## 2022-07-16 LAB — GLUCOSE, CAPILLARY
Glucose-Capillary: 254 mg/dL — ABNORMAL HIGH (ref 70–99)
Glucose-Capillary: 200 mg/dL — ABNORMAL HIGH (ref 70–99)

## 2022-07-16 MED ORDER — APIXABAN (ELIQUIS) VTE STARTER PACK (10MG AND 5MG): 10.0000 mg | ORAL_TABLET | Freq: Two times a day (BID) | ORAL | 0 refills | Status: AC

## 2022-07-16 NOTE — Discharge Summary (Signed)
[facility] Discharge Summary   Patient: Natalie Adams [id number]  PCP: Pcp, No  Date of admission: 07/09/2022   Date of discharge:  07/16/2022     Discharge Diagnoses:  Principal Problem:   NSTEMI (non-ST elevated myocardial infarction) [facility]) Active Problems:   Elevated d-dimer   Acute pulmonary edema (HCC)   Hyperkalemia   Essential hypertension   Type 2 diabetes mellitus without complication, with long-term current use of insulin (HCC)   Acute HFrEF (heart failure with reduced ejection fraction) ([place])   Admitted From: Home Disposition:  Home   Recommendations for Outpatient Follow-up:  Hospice care Follow up LABS/TEST:  None   Diet recommendation: Carb modified diet  Activity: The patient is advised to gradually reintroduce usual activities, as tolerated  Discharge Condition: stable  Code Status: DNR   History of present illness: As per the H and P dictated on admission Hospital Course:  Natalie Adams is a 89 y.o. African-American female with medical history significant for type diabetes mellitus, hypertension, asthma and osteoarthritis who presented to the emergency room with acute onset of dyspnea and altered mental status with confusion after having a bowel movement today.  She was hypothermic.  She was not communicative as her usual .  She did not have any reported chest pain. She has mild lower extremity edema without worsening.  No any other symptoms. Ed w/up:  HR 108, RR 41, Tma 96.7 O2 sat 96 on RA,  VBG with pH 7.39, HCO3 32.1 and pCO2 53 and pO2 of 46.  K 5.9 hyperkalemia, SBG 170, troponin 641--729 elevated, BNP 2688 elevated lactic acid 1.7--1.5, Hb 10.7, D-dimer 1.85 elevated   Assessment and Plan: # Hypothermia developed on 3/14, Resolved s/p Bair hugger. Tem wnl now  # Acute metabolic encephalopathy could be secondary to hypoxia and hypertensive emergency. Continue supportive care, fall precautions, COVID and flu negative, blood culture negative.  alert and awake, AAO x 1 # Elevated troponin most likely due to demand ischemia secondary to hypertensive emergency and pulmonary embolism, Less likely Non-STEMI, Patient denies any chest pain. S/p aspirin 81 mg POD given during hospital stay, discontinued on discharge due to risk of bleeding, as patient is on Eliquis.  Continue statin.   S/p IV heparin gtt, d/c'd on 3/12, Troponin 729 -- 920 --564, trending down TTE shows LVEF 35 to 40%, moderate global hypokinesis with regional wall motion abnormality, severe hypokinesis of inferior/posterior wall.  Moderate asymmetric LV hypertrophy of septal segment, grade 1 diastolic dysfunction.  RV systolic function is moderately reduced.  Mild pulmonary hypertension.  Moderate MR, mild to moderate AR, severe aortic valve stenosis. Cardiology consult appreciated, recommended no ischemic workup at this time due to advanced age and poor prognosis, continue heparin for 48 hours and follow TTE # Pulmonary embolism, Elevated D-dimer, CTA chest/a/p: age-indeterminate subsegmental right lower lobe arterial emboli. No large-vessel or further arterial embolus is seen. Prominent pulmonary trunk as before. venous duplex of Lex: negative for DVT. S/p heparin IV infusion, on 3/12 transition to Eliquis 10 mg p.o. twice daily for 7 days followed by 5 mg p.o. twice daily # Acute pulmonary edema: It could be secondary to non-STEMI and PE, acute cor pulmonale vs hypertensive emergency. BNP elevated, TTE as above consistent with systolic CHF. S/p IV Lasix for diuresis, s/p Lasix 40 mg IV BID x 2 dose. 3/11 decrease to Lasix 20 mg IV twice daily due to elevated creatinine level 3/12 CT chest: Bilateral pleural effusions layering dependently, slightly larger on the right  than the left, similar to the previous exam. Dependent pulmonary atelectasis, particularly at the right lung base. This could be simple atelectasis or mild atelectatic pneumonia, particularly at the right lung base.   Patient is off and on having shortness of breath, currently saturating well on room air, may not need thoracentesis. # Hypertensive emergency, SBP 212/127, History of hypertension Patient received IV labetalol and IV Lasix. Continue Lopressor 12.5 mg po BID Blood pressure dropped after IV labetalol, so BP medications were discontinued.  On 3/11 patient blood pressure dropped transiently, Levophed was ordered and patient was transferred to stepdown unit but did not require pressor support.  BP improved, patient was transferred back to cardiac telemetry bed.  Lopressor was resumed.  # AKI on CKD stage IIIb: Creatinine 1.15-->1.72 elevated due to dehydration S/p IV fluid for overnight hydration. Cr 1.64, improved. # Hyperkalemia, s/p D50, insulin and IV calcium gluconate. Potassium 4.8, hyperkalemia resolved. On 3/15 K 5.8, again elevated, Lokelma x 1 dose given # Hypokalemia, potassium 3.2, potassium repleted.  # Type 2 diabetes mellitus without complication, with long-term current use of insulin. Developed hypoglycemia on 3/10, hypoglycemia was followed, and scheduled NovoLog was discontinued.  Patient was on NovoLog sliding scale.  Currently blood glucose is stable, continue home dose insulin and monitor CBG, titrate dose of insulin accordingly.  Continue diabetic diet. # Bilateral hydroureteronephrosis without filling defect, CT scan reviewed US renal: hick-walled appearance of the urinary bladder.  Mild left hydronephrosis. No right hydronephrosis. Mildly echogenic kidneys.  Bilateral effusions. Urology consulted, recommended no intervention. # Gross hematuria, most likely due to heparin infusion, Hb 11.2, remained stable.  Hematuria resolved. # Pulmonary nodule and mediastinal lymphadenopathy, incidental finding CTA: Mediastinal lymphadenopathy measuring up to 1.2 cm in short axis is favored to be reactive. Right upper lobe pulmonary nodule measuring 8 mm. Non-contrast chest CT at 6-12 months is  recommended. If the nodule is stable at time of repeat CT, then future CT at 18-24 months. Recommended to follow with PCP but but palliative care was consulted and currently patient is under hospice care at home.  Body mass index is 23.13 kg/m.  Nutrition Interventions:   Pressure Injury 12/12/21 Buttocks Left Stage 2 -  Partial thickness loss of dermis presenting as a shallow open injury with a red, pink wound bed without slough. (Active)  12/12/21 1812  Location: Buttocks  Location Orientation: Left  Staging: Stage 2 -  Partial thickness loss of dermis presenting as a shallow open injury with a red, pink wound bed without slough.  Wound Description (Comments):   Present on Admission: Yes     Pressure Injury 07/10/22 Sacrum Stage 2 -  Partial thickness loss of dermis presenting as a shallow open injury with a red, pink wound bed without slough. (Active)  07/10/22 1800  Location: Sacrum  Location Orientation:   Staging: Stage 2 -  Partial thickness loss of dermis presenting as a shallow open injury with a red, pink wound bed without slough.  Wound Description (Comments):   Present on Admission: Yes  Dressing Type Foam - Lift dressing to assess site every shift 07/15/22 2030     Patient is bedbound at baseline, lives with her daughter.  Palliative care was consulted and patient's daughter agreed for hospice care at home.  Patient's daughter wanted to treat the treatable, she is not on total comfort measures.  So patient was managed as above and today she seems to be stable to discharge home with hospice care.  Consultants:  Cardiologist, urologist, palliative care Procedures: None  Discharge Exam: General: Appear in no distress, no Rash; Oral Mucosa Clear, moist. Cardiovascular: S1 and S2 Present, no Murmur, Respiratory: normal respiratory effort, Bilateral Air entry present and no Crackles, no wheezes Abdomen: Bowel Sound present, Soft and no tenderness, no hernia Extremities: no  Pedal edema, no calf tenderness Neurology: alert and oriented to time, place, and person affect appropriate.  Filed Weights   07/14/22 0452 07/15/22 0629 07/16/22 0357  Weight: 67.5 kg 66.6 kg 67 kg   Vitals:   07/16/22 0400 07/16/22 0751  BP:  126/70  Pulse:  (!) 107  Resp: 20 18  Temp:  97.7 F (36.5 C)  SpO2:  96%    DISCHARGE MEDICATION: Allergies as of 07/16/2022       Reactions   Amoxicillin-pot Clavulanate Diarrhea   Hydrochlorothiazide Other (See Comments)   "she got shaky and fell" "she got shaky and fell"        Medication List     STOP taking these medications    Algesis Tabs   amLODipine 10 MG tablet Commonly known as: NORVASC   Fish Oil 1200 MG Caps       TAKE these medications    acetaminophen 500 MG tablet Commonly known as: TYLENOL Take 2 tablets (1,000 mg total) by mouth 2 (two) times daily as needed for mild pain or moderate pain.   Apixaban Starter Pack (10mg  and 5mg ) Commonly known as: ELIQUIS STARTER PACK Take 10 mg by mouth 2 (two) times daily. Take as directed on package: start with two-5mg  tablets twice daily for 7 days. On day 8, switch to one-5mg  tablet twice daily. Started on 07/12/22   ascorbic acid 1000 MG tablet Commonly known as: VITAMIN C Take 1,000 mg by mouth daily.   Cranberry 300 MG tablet Take 300 mg by mouth 2 (two) times daily.   insulin lispro 100 UNIT/ML injection Commonly known as: HUMALOG Sliding scale insulin # of units = (BS-150/50) TID AC   insulin NPH Human 100 UNIT/ML injection Commonly known as: NOVOLIN N Inject 8-14 Units into the skin 2 (two) times daily before a meal. 14 units qam 8 units qpm   ipratropium-albuterol 0.5-2.5 (3) MG/3ML Soln Commonly known as: DUONEB SMARTSIG:1 Ampule(s) Via Nebulizer Every 6 Hours PRN   Iron 18 MG Tbcr Take 1 tablet (18 mg total) by mouth daily. Can take any over-the-counter iron supplement.   metoprolol tartrate 25 MG tablet Commonly known as:  LOPRESSOR Take 12.5 mg by mouth 2 (two) times daily.   Multiple Vitamins tablet Take by mouth.   OSTEO BI-FLEX ONE PER DAY PO Take 1 capsule by mouth daily.   polyethylene glycol 17 g packet Commonly known as: MIRALAX / GLYCOLAX Take 17 g by mouth daily.   pravastatin 40 MG tablet Commonly known as: PRAVACHOL Take 40 mg by mouth at bedtime.               Discharge Care Instructions  (From admission, onward)           Start     Ordered   07/16/22 0000  Discharge wound care:       Comments: As above   07/16/22 1058           Allergies  Allergen Reactions   Amoxicillin-Pot Clavulanate Diarrhea   Hydrochlorothiazide Other (See Comments)    "she got shaky and fell" "she got shaky and fell"    Discharge Instructions     Diet -  low sodium heart healthy   Complete by: As directed    Discharge instructions   Complete by: As directed    F/u Hospice   Discharge wound care:   Complete by: As directed    As above   Increase activity slowly   Complete by: As directed        The results of significant diagnostics from this hospitalization (including imaging, microbiology, ancillary and laboratory) are listed below for reference.    Significant Diagnostic Studies: CT CHEST WO CONTRAST  Result Date: 07/12/2022 CLINICAL DATA:  Pneumonia. Complication suspected. Diabetes, hypertension and asthma. Shortness of breath. EXAM: CT CHEST WITHOUT CONTRAST TECHNIQUE: Multidetector CT imaging of the chest was performed following the standard protocol without IV contrast. RADIATION DOSE REDUCTION: This exam was performed according to the departmental dose-optimization program which includes automated exposure control, adjustment of the mA and/or kV according to patient size and/or use of iterative reconstruction technique. COMPARISON:  Chest radiography yesterday.  Prior CT 07/10/2022. FINDINGS: Cardiovascular: Prominent cardiomegaly. Small amount of pericardial fluid.  Coronary artery calcification and aortic atherosclerotic calcification. Mediastinum/Nodes: Mild mediastinal nodal prominence, likely reactive, not visibly changed since the prior exam. Lungs/Pleura: Bilateral pleural effusions layering dependently, slightly larger on the right than the left, similar to the previous exam. Dependent pulmonary atelectasis, particularly the lung bases, right more than left. This could be simple atelectasis or mild atelectatic pneumonia, particularly at the right lung base. Evaluation of lung parenchyma is degraded by breathing motion. There does appear be redemonstration of the 8 mm right upper lobe nodule axial image 54. No new focal pulmonary finding. As previously, noncontrast chest CT at 6-12 months is recommended. Upper Abdomen: No acute upper abdominal finding. Musculoskeletal: Chronic spinal degenerative changes. No acute finding. IMPRESSION: 1. Bilateral pleural effusions layering dependently, slightly larger on the right than the left, similar to the previous exam. Dependent pulmonary atelectasis, particularly at the right lung base. This could be simple atelectasis or mild atelectatic pneumonia, particularly at the right lung base. 2. Prominent cardiomegaly. Coronary artery calcification. Small amount of pericardial fluid. 3. Aortic atherosclerotic calcification. 4. 8 mm right upper lobe nodule, unchanged since the previous exam. As previously, noncontrast chest CT at 6-12 months is recommended. 5. Aortic atherosclerosis. Aortic Atherosclerosis (ICD10-I70.0). Electronically Signed   By: Nelson Chimes M.D.   On: 07/12/2022 10:35   US RENAL  Result Date: 07/12/2022 CLINICAL DATA:  Bladder obstruction EXAM: RENAL / URINARY TRACT ULTRASOUND COMPLETE COMPARISON:  CT 07/10/2022 FINDINGS: Right Kidney: Renal measurements: 10 x 4.1 x 5.5 cm = volume: 119.2 mL. Cortex is echogenic. No mass or hydronephrosis. Left Kidney: Renal measurements: 9.9 x 4.4 x 3.9 cm = volume: 88.8 mL.  Cortex is slightly echogenic. Negative for mass. Mild hydronephrosis. Bladder: Thick-walled appearance of urinary bladder Other: Bilateral pleural effusions IMPRESSION: 1.  hick-walled appearance of the urinary bladder. 2.  Mild left hydronephrosis.  No right hydronephrosis. 3.  Mildly echogenic kidneys 4.   Bilateral effusions Electronically Signed   By: Donavan Foil M.D.   On: 07/12/2022 02:16   DG Chest Port 1 View  Result Date: 07/11/2022 CLINICAL DATA:  Shortness of breath EXAM: PORTABLE CHEST 1 VIEW COMPARISON:  07/09/2022 FINDINGS: Cardiac shadow is again enlarged. Aortic calcifications are noted. Small bilateral pleural effusions are again seen. No focal confluent infiltrate is noted. No bony abnormality is seen. IMPRESSION: Small bilateral pleural effusions. No focal confluent infiltrate is seen. Electronically Signed   By: Inez Catalina M.D.   On:  07/11/2022 11:18   ECHOCARDIOGRAM COMPLETE  Result Date: 07/10/2022    ECHOCARDIOGRAM REPORT   Patient Name:   SHUNDRIKA REISCHL Date of Exam: 07/10/2022 Medical Rec #:  [id number]     Height:       67.0 in Accession #:    [id number]    Weight:       160.0 lb Date of Birth:  08-Mar-1930    BSA:          1.839 m Patient Age:    89 years      BP:           152/103 mmHg Patient Gender: F             HR:           88 bpm. Exam Location:  [facility] Procedure: 2D Echo, Color Doppler and Cardiac Doppler Indications:     NSTEMI  History:         Patient has no prior history of Echocardiogram examinations.                  Asthma; Risk Factors:Diabetes and Hypertension.  Sonographer:     Wallace Keller Thornton-Maynard Referring Phys:  [id number] [place] Diagnosing Phys: Ida Rogue MD IMPRESSIONS  1. Left ventricular ejection fraction, by estimation, is 35 to 40%. The left ventricle has moderately decreased function. The left ventricle demonstrates moderate global hypokinesis with regional wall motion abnormalities (severe hypokinesis of the inferior/posterior wall). There  is moderate asymmetric left ventricular hypertrophy of the septal segment with no significant LVOT gradient measured. Left ventricular diastolic parameters are consistent with Grade I diastolic dysfunction (impaired relaxation).  2. Right ventricular systolic function is moderately reduced. The right ventricular size is normal. There is mildly elevated pulmonary artery systolic pressure. The estimated right ventricular systolic pressure is [id number] mmHg.  3. The mitral valve is normal in structure, moderately calcified. Moderate mitral valve regurgitation. No evidence of mitral stenosis.  4. The aortic valve is normal in structure. There is severe calcifcation of the aortic valve. Aortic valve regurgitation is mild to moderate. Severe aortic valve stenosis. Aortic valve area, by VTI measures 0.40 cm. Aortic valve mean gradient measures 44.5 mmHg. Aortic valve Vmax measures 4.27 m/s.  5. The inferior vena cava is normal in size with <50% respiratory variability, suggesting right atrial pressure of 8 mmHg.  6. Moderate pleural effusion in the left lateral region. FINDINGS  Left Ventricle: Left ventricular ejection fraction, by estimation, is 35 to 40%. The left ventricle has moderately decreased function. The left ventricle demonstrates regional wall motion abnormalities. The left ventricular internal cavity size was normal in size. There is moderate asymmetric left ventricular hypertrophy of the septal segment. Left ventricular diastolic parameters are consistent with Grade I diastolic dysfunction (impaired relaxation). Right Ventricle: The right ventricular size is normal. No increase in right ventricular wall thickness. Right ventricular systolic function is moderately reduced. There is mildly elevated pulmonary artery systolic pressure. The tricuspid regurgitant velocity is 3.03 m/s, and with an assumed right atrial pressure of 5 mmHg, the estimated right ventricular systolic pressure is [id number] mmHg. Left Atrium: Left  atrial size was normal in size. Right Atrium: Right atrial size was normal in size. Pericardium: There is no evidence of pericardial effusion. Mitral Valve: The mitral valve is normal in structure. There is mild calcification of the mitral valve leaflet(s). Mild mitral annular calcification. Moderate mitral valve regurgitation. No evidence of mitral valve stenosis. MV peak gradient, 8.4 mmHg. The mean  mitral valve gradient is 4.0 mmHg. Tricuspid Valve: The tricuspid valve is normal in structure. Tricuspid valve regurgitation is mild . No evidence of tricuspid stenosis. Aortic Valve: The aortic valve is normal in structure. There is severe calcifcation of the aortic valve. Aortic valve regurgitation is mild to moderate. Aortic regurgitation PHT measures 334 msec. Severe aortic stenosis is present. Aortic valve mean gradient measures 44.5 mmHg. Aortic valve peak gradient measures 72.8 mmHg. Aortic valve area, by VTI measures 0.40 cm. Pulmonic Valve: The pulmonic valve was normal in structure. Pulmonic valve regurgitation is mild. No evidence of pulmonic stenosis. Aorta: The aortic root is normal in size and structure. Venous: The inferior vena cava is normal in size with less than 50% respiratory variability, suggesting right atrial pressure of 8 mmHg. IAS/Shunts: No atrial level shunt detected by color flow Doppler. Additional Comments: There is a moderate pleural effusion in the left lateral region.  LEFT VENTRICLE PLAX 2D LVIDd:         4.60 cm     Diastology LVIDs:         4.20 cm     LV e' medial:    4.03 cm/s LV PW:         0.70 cm     LV E/e' medial:  20.4 LV IVS:        1.60 cm     LV e' lateral:   4.68 cm/s LVOT diam:     1.90 cm     LV E/e' lateral: 17.6 LV SV:         34 LV SV Index:   19 LVOT Area:     2.84 cm  LV Volumes (MOD) LV vol d, MOD A2C: 85.0 ml LV vol d, MOD A4C: 62.9 ml LV vol s, MOD A2C: 51.6 ml LV vol s, MOD A4C: 37.8 ml LV SV MOD A2C:     33.4 ml LV SV MOD A4C:     62.9 ml LV SV MOD BP:       30.6 ml RIGHT VENTRICLE             IVC RV S prime:     10.10 cm/s  IVC diam: 1.50 cm TAPSE (M-mode): 2.0 cm LEFT ATRIUM             Index        RIGHT ATRIUM           Index LA diam:        3.90 cm 2.12 cm/m   RA Area:     16.00 cm LA Vol (A2C):   62.5 ml 33.98 ml/m  RA Volume:   38.80 ml  21.10 ml/m LA Vol (A4C):   50.8 ml 27.62 ml/m LA Biplane Vol: 57.6 ml 31.32 ml/m  AORTIC VALVE                     PULMONIC VALVE AV Area (Vmax):    0.37 cm      PV Vmax:          1.27 m/s AV Area (Vmean):   0.37 cm      PV Peak grad:     6.5 mmHg AV Area (VTI):     0.40 cm      PR End Diast Vel: 5.38 msec AV Vmax:           426.50 cm/s AV Vmean:          317.000 cm/s AV VTI:  0.855 m AV Peak Grad:      72.8 mmHg AV Mean Grad:      44.5 mmHg LVOT Vmax:         55.90 cm/s LVOT Vmean:        41.700 cm/s LVOT VTI:          0.121 m LVOT/AV VTI ratio: 0.14 AI PHT:            334 msec  AORTA Ao Root diam: 2.90 cm Ao Asc diam:  3.80 cm MITRAL VALVE                  TRICUSPID VALVE MV Area (PHT): 5.27 cm       TR Peak grad:   36.7 mmHg MV Area VTI:   1.61 cm       TR Vmax:        303.00 cm/s MV Peak grad:  8.4 mmHg MV Mean grad:  4.0 mmHg       SHUNTS MV Vmax:       1.45 m/s       Systemic VTI:  0.12 m MV Vmean:      89.7 cm/s      Systemic Diam: 1.90 cm MV Decel Time: 144 msec MR Peak grad:    146.9 mmHg MR Mean grad:    88.0 mmHg MR Vmax:         606.00 cm/s MR Vmean:        443.0 cm/s MR PISA:         1.01 cm MR PISA Eff ROA: 6 mm MR PISA Radius:  0.40 cm MV E velocity: 82.30 cm/s MV A velocity: 127.00 cm/s MV E/A ratio:  0.65 Ida Rogue MD Electronically signed by Ida Rogue MD Signature Date/Time: 07/10/2022/12:27:00 PM    Final    US Venous Img Lower Bilateral (DVT)  Result Date: 07/10/2022 CLINICAL DATA:  [id number] Acute pulmonary embolism ([place]) [id number] EXAM: BILATERAL LOWER EXTREMITY VENOUS DOPPLER ULTRASOUND TECHNIQUE: Gray-scale sonography with graded compression, as well as color Doppler and  duplex ultrasound were performed to evaluate the lower extremity deep venous systems from the level of the common femoral vein and including the common femoral, femoral, profunda femoral, popliteal and calf veins including the posterior tibial, peroneal and gastrocnemius veins when visible. The superficial great saphenous vein was also interrogated. Spectral Doppler was utilized to evaluate flow at rest and with distal augmentation maneuvers in the common femoral, femoral and popliteal veins. COMPARISON:  CT CAP earlier same day. FINDINGS: RIGHT LOWER EXTREMITY VENOUS Normal compressibility of the RIGHT common femoral, superficial femoral, and popliteal veins, as well as the visualized calf veins. Visualized portions of profunda femoral vein and great saphenous vein unremarkable. No filling defects to suggest DVT on grayscale or color Doppler imaging. Doppler waveforms show normal direction of venous flow, normal respiratory plasticity and response to augmentation. OTHER No evidence of superficial thrombophlebitis or abnormal fluid collection. Limitations: Patient body habitus LEFT LOWER EXTREMITY VENOUS Normal compressibility of the LEFT common femoral, superficial femoral, and popliteal veins, as well as the visualized calf veins. Visualized portions of profunda femoral vein and great saphenous vein unremarkable. No filling defects to suggest DVT on grayscale or color Doppler imaging. Doppler waveforms show normal direction of venous flow, normal respiratory plasticity and response to augmentation. OTHER No evidence of superficial thrombophlebitis or abnormal fluid collection. Limitations: Patient body habitus IMPRESSION: No evidence of femoropopliteal DVT or superficial thrombophlebitis within either lower extremity. Michaelle Birks, MD [facility] Radiology Electronically Signed   By: Michaelle Birks M.D.   On: 07/10/2022 06:21   CT Angio Chest/Abd/Pel for Dissection W  and/or Wo Contrast  Result Date: 07/10/2022 CLINICAL DATA:  Tachycardia, hypertension and elevated troponins. Shortness of breath also. EXAM: CT ANGIOGRAPHY CHEST, ABDOMEN AND PELVIS TECHNIQUE: Non-contrast CT of the chest was initially obtained. Multidetector CT imaging through the chest, abdomen and pelvis was performed using the standard protocol during bolus administration of intravenous contrast. Multiplanar reconstructed images and MIPs were obtained and reviewed to evaluate the vascular anatomy. RADIATION DOSE REDUCTION: This exam was performed according to the departmental dose-optimization program which includes automated exposure control, adjustment of the mA and/or kV according to patient size and/or use of iterative reconstruction technique. CONTRAST:  93mL OMNIPAQUE IOHEXOL 350 MG/ML SOLN COMPARISON:  CTA chest yesterday at 11:57 p.m., CT abdomen and pelvis with IV contrast dated 12/11/2021. FINDINGS: CTA CHEST FINDINGS Cardiovascular: Again noted are nonoccluding age-indeterminate subsegmental right lower lobe arterial emboli with involvement of 2 vessels on 6: 88 and 89 and a single small-vessel on 6:94 and 95, also seen on coronal reconstruction series 9, image 83. No large vessel or further arterial embolus is seen. The pulmonary trunk again measuring 3.5 cm indicating arterial hypertension with IVC and hepatic vein contrast reflux consistent with elevated right heart pressures. There is no increased RV/LV ratio. Moderate panchamber cardiomegaly continues to be seen with three-vessel coronary artery calcifications and no pericardial effusion. There are mildly prominent superior pulmonary veins. There is moderate patchy atherosclerosis in the aorta, heavy calcification along the valve leaflets, moderate calcification left subclavian artery. There is no aneurysm, stenosis or dissection. Mediastinum/Nodes: There are mildly enlarged multifocal mediastinal lymph nodes and a few mildly prominent hilar  nodes but none greater than 1.2 cm in short axis Lungs/Pleura: Respiratory motion limits evaluation of the lungs. There is mild interstitial edema, moderate right and small layering left pleural effusions and again noted and diffuse bronchial thickening. 8 mm right upper lobe nodule is again noted on 7:53. There is posterior atelectasis. Scattered linear scar-like opacities. No focal pneumonia is evident. No pneumothorax. Musculoskeletal: Multiple mild mid to lower thoracic spine compression fracture deformities. Osteopenia and degenerative change. The ribcage is intact. Review of the MIP images confirms the above findings. CTA ABDOMEN AND PELVIS FINDINGS VASCULAR Aorta: Moderately calcified without stenosis, aneurysm or dissection mildly tortuous. Celiac: There are ostial wall calcifications but no stenosis or branch occlusion. SMA: Normal. Renals: Both are single. The left renal artery demonstrates a 75% calcific origin stenosis and is otherwise clear. On the right there are ostial calcifications but no stenosis. IMA: Normal. Inflow: There are mild-to-moderate patchy calcifications without flow-limiting stenoses Veins: Patent suprarenal IVC. The other veins are unopacified and not evaluated. Review of the MIP images confirms the above findings. NON-VASCULAR Hepatobiliary: There is a 1.7 cm densely calcified stone in the gallbladder. No wall thickening or biliary dilatation. No focal abnormality of the liver. Pancreas: No abnormality. Spleen: No abnormality. Adrenals/Urinary Tract: No adrenal or renal mass. There is increased bilateral mild hydroureteronephrosis without a visible ureteral filling defect. There is contrast in the collecting systems which would obscure stones if present. The bladder is increased in distention up to the level of L5. There is thickening and trabeculation of the bladder and numerous small bladder diverticula. No masslike wall thickening. Stomach/Bowel: No dilatation or wall thickening  including the appendix. Moderate stool retention ascending colon. Uncomplicated scattered diverticulosis. Lymphatic: No adenopathy is  seen. Reproductive: Unremarkable uterus and left ovary. There is homogeneous thin-walled right ovarian cyst measuring 3.2 cm, with Hounsfield density of 16. Other: Trace deep pelvic ascites. No free hemorrhage, free air or incarcerated hernias. Small umbilical fat hernia. Musculoskeletal: Osteopenia and degenerative change lumbar spine. Mild-to-moderate hip DJD. No acute or other significant osseous findings. Review of the MIP images confirms the above findings. IMPRESSION: 1. Again noted age-indeterminate subsegmental right lower lobe arterial emboli. No large-vessel or further arterial embolus is seen. Prominent pulmonary trunk as before. 2. Cardiomegaly with mild interstitial edema, moderate right and small left pleural effusions and diffuse bronchial thickening. 3. Aortic and coronary artery atherosclerosis. 4. Aortic valve leaflet calcifications which may be associated with stenosis or insufficiency. 5. Contrast reflux into the IVC and hepatic veins consistent with elevated right heart pressures. No increased RV/LV ratio. 6. 75% calcific origin stenosis of the left renal artery. 7. 3.2 cm thin-walled right ovarian cyst. Recommend follow-up pelvic ultrasound in 6-12 months. Reference: JACR 2020 Feb;17(2):248-254 8. Increased bilateral hydroureteronephrosis without visible ureteral filling defect. 9. Thickened trabeculated bladder with numerous small diverticula. 10. Cholelithiasis. 11. Trace deep pelvic ascites. 12. Osteopenia and degenerative change with multiple mild mid to lower thoracic spine compression fracture deformities. 13. 8 mm right upper lobe nodule. Non-contrast chest CT at 6-12 months is recommended. If the nodule is stable at time of repeat CT, then future CT at 18-24 months (from today's scan) is considered optional for low-risk patients, but is recommended for  high-risk patients. This recommendation follows the consensus statement: Guidelines for Management of Incidental Pulmonary Nodules Detected on CT Images: From the Fleischner Society 2017; Radiology 2017; 284:228-243. Aortic Atherosclerosis (ICD10-I70.0). Electronically Signed   By: Telford Nab M.D.   On: 07/10/2022 03:32   CT Angio Chest PE W and/or Wo Contrast  Result Date: 07/10/2022 CLINICAL DATA:  Shortness of breath.  PE suspected EXAM: CT ANGIOGRAPHY CHEST WITH CONTRAST TECHNIQUE: Multidetector CT imaging of the chest was performed using the standard protocol during bolus administration of intravenous contrast. Multiplanar CT image reconstructions and MIPs were obtained to evaluate the vascular anatomy. RADIATION DOSE REDUCTION: This exam was performed according to the departmental dose-optimization program which includes automated exposure control, adjustment of the mA and/or kV according to patient size and/or use of iterative reconstruction technique. CONTRAST:  81mL OMNIPAQUE IOHEXOL 350 MG/ML SOLN COMPARISON:  Radiographs 07/09/2022 FINDINGS: Cardiovascular: The central pulmonary arteries are well opacified and demonstrate no filling defects to suggest pulmonary embolism. The lower lung segmental and subsegmental pulmonary arteries are not well evaluated due to respiratory motion. Possible filling defects in the right lower lobe pulmonary arteries are favored artifactual secondary to motion and if real are favored to represent eccentric chronic thrombus rather than acute embolism. (For example circa series 6/image 231 in the posterior right lower lobe and lateral pulmonary arteries). Cardiomegaly. Reflux of contrast into the hepatic veins compatible with elevated right heart pressures. Dilated main pulmonary artery measuring 35 mm in diameter which can be seen with pulmonary hypertension. Coronary artery and aortic atherosclerotic calcification. Aortic valve calcification. Mediastinum/Nodes:  Mediastinal lymphadenopathy measuring up to 1.2 cm in short axis (precarinal node series 6/image 129). Unremarkable esophagus. Lungs/Pleura: Diffuse patchy ground-glass opacity and interlobular septal thickening compatible with pulmonary edema. Diffuse bronchial wall thickening and peripheral predominant centrilobular micronodularity. Small right-greater-than-left pleural effusions. 8 mm pulmonary nodule in the right upper lobe laterally (5/45). Bronchiectasis/bronchiolectasis in the bilateral lower lobes and right middle lobe is well as the lingula. Upper Abdomen:  No acute abnormality. Musculoskeletal: Compression deformities of the superior endplate of multiple mid and lower thoracic vertebral bodies is favored chronic. No evidence of acute fracture. Review of the MIP images confirms the above findings. IMPRESSION: 1. No central pulmonary embolism. The bilateral lower lobe segmental and subsegmental pulmonary arteries are not well visualized due to respiratory motion. Possible filling defects in the right lower lobe lateral and posterior segmental arteries are favored due to motion however acute or chronic thromboembolism is difficult to exclude. 2. Cardiomegaly with evidence of pulmonary edema and small right-greater-than-left pleural effusions. 3. Diffuse bronchial wall thickening and peripheral centrilobular micro nodularity suggestive of airway inflammation/infection. 4. Dilated main pulmonary artery which can be seen with pulmonary hypertension. 5. Mediastinal lymphadenopathy measuring up to 1.2 cm in short axis is favored to be reactive. 6. Right upper lobe pulmonary nodule measuring 8 mm. Non-contrast chest CT at 6-12 months is recommended. If the nodule is stable at time of repeat CT, then future CT at 18-24 months (from today's scan) is considered optional for low-risk patients, but is recommended for high-risk patients. This recommendation follows the consensus statement: Guidelines for Management of  Incidental Pulmonary Nodules Detected on CT Images: From the Fleischner Society 2017; Radiology 2017; 284:228-243. Aortic Atherosclerosis (ICD10-I70.0). Electronically Signed   By: Placido Sou M.D.   On: 07/10/2022 01:22   DG Chest Port 1 View  Result Date: 07/09/2022 CLINICAL DATA:  [id number] Sepsis [facility]) [id number] EXAM: PORTABLE CHEST 1 VIEW COMPARISON:  Chest x-ray 12/11/2021, chest x-ray 10/09/2020 FINDINGS: The heart and mediastinal contours are unchanged. Aortic calcification. Question bilateral mid to lower lung zones patchy airspace interstitial opacities with likely at least trace to small volume pleural effusions. No pneumothorax. No acute osseous abnormality. IMPRESSION: Question bilateral mid to lower lung zones patchy airspace interstitial opacities with likely at least trace to small volume pleural effusions. Recommend CT chest with intravenous contrast for further evaluation. Electronically Signed   By: Iven Finn M.D.   On: 07/09/2022 23:19   CT Head Wo Contrast  Result Date: 07/09/2022 CLINICAL DATA:  Mental status change, unknown cause EXAM: CT HEAD WITHOUT CONTRAST TECHNIQUE: Contiguous axial images were obtained from the base of the skull through the vertex without intravenous contrast. RADIATION DOSE REDUCTION: This exam was performed according to the departmental dose-optimization program which includes automated exposure control, adjustment of the mA and/or kV according to patient size and/or use of iterative reconstruction technique. COMPARISON:  CT head 12/11/2021 FINDINGS: Brain: Cerebral ventricle sizes are concordant with the degree of cerebral volume loss. Patchy and confluent areas of decreased attenuation are noted throughout the deep and periventricular white matter of the cerebral hemispheres bilaterally, compatible with chronic microvascular ischemic disease. Similar-appearing cystic lesion in the anterior left frontal lobe. No evidence of large-territorial acute  infarction. No parenchymal hemorrhage. No mass lesion. No extra-axial collection. No mass effect or midline shift. No hydrocephalus. Basilar cisterns are patent. Vascular: No hyperdense vessel. Skull: No acute fracture or focal lesion. Question left temporomandibular joint dislocation. Degenerative changes of the right temporomandibular joint. Sinuses/Orbits: Sphenoid wall hypertrophy on the right suggestive of chronic sinusitis. Paranasal sinuses and mastoid air cells are clear. Bilateral lens replacement. Otherwise the orbits are unremarkable. Other: None. IMPRESSION: 1. No acute intracranial abnormality. 2. Question left temporomandibular joint dislocation. Correlate with physical exam. Electronically Signed   By: Iven Finn M.D.   On: 07/09/2022 23:17    Microbiology: Recent Results (from the past 240 hour(s))  Blood culture (routine x 2)  Status: None   Collection Time: 07/09/22 10:08 PM   Specimen: BLOOD  Result Value Ref Range Status   Specimen Description BLOOD BLOOD RIGHT ARM  Final   Special Requests   Final    BOTTLES DRAWN AEROBIC AND ANAEROBIC Blood Culture adequate volume   Culture   Final    NO GROWTH 5 DAYS Performed at [facility], [address], [place], [place] [address]    Report Status 07/14/2022 FINAL  Final  Blood culture (routine x 2)     Status: None   Collection Time: 07/09/22 10:08 PM   Specimen: BLOOD  Result Value Ref Range Status   Specimen Description BLOOD BLOOD LEFT ARM  Final   Special Requests   Final    BOTTLES DRAWN AEROBIC AND ANAEROBIC Blood Culture adequate volume   Culture   Final    NO GROWTH 5 DAYS Performed at [facility], [address] [place], [place] [address]    Report Status 07/14/2022 FINAL  Final  Resp panel by RT-PCR (RSV, Flu A&B, Covid) Anterior Nasal Swab     Status: None   Collection Time: 07/09/22 10:16 PM   Specimen: Anterior Nasal Swab  Result Value Ref Range Status   SARS Coronavirus 2 by RT  PCR NEGATIVE NEGATIVE Final    Comment: (NOTE) SARS-CoV-2 target nucleic acids are NOT DETECTED.  The SARS-CoV-2 RNA is generally detectable in upper respiratory specimens during the acute phase of infection. The lowest concentration of SARS-CoV-2 viral copies this assay can detect is 138 copies/mL. A negative result does not preclude SARS-Cov-2 infection and should not be used as the sole basis for treatment or other patient management decisions. A negative result may occur with  improper specimen collection/handling, submission of specimen other than nasopharyngeal swab, presence of viral mutation(s) within the areas targeted by this assay, and inadequate number of viral copies(<138 copies/mL). A negative result must be combined with clinical observations, patient history, and epidemiological information. The expected result is Negative.  Fact Sheet for Patients:  EntrepreneurPulse.com.au  Fact Sheet for Healthcare Providers:  IncredibleEmployment.be  This test is no t yet approved or cleared by the [place] FDA and  has been authorized for detection and/or diagnosis of SARS-CoV-2 by FDA under an Emergency Use Authorization (EUA). This EUA will remain  in effect (meaning this test can be used) for the duration of the COVID-19 declaration under Section 564(b)(1) of the Act, 21 U.S.C.section 360bbb-3(b)(1), unless the authorization is terminated  or revoked sooner.       Influenza A by PCR NEGATIVE NEGATIVE Final   Influenza B by PCR NEGATIVE NEGATIVE Final    Comment: (NOTE) The Xpert Xpress SARS-CoV-2/FLU/RSV plus assay is intended as an aid in the diagnosis of influenza from Nasopharyngeal swab specimens and should not be used as a sole basis for treatment. Nasal washings and aspirates are unacceptable for Xpert Xpress SARS-CoV-2/FLU/RSV testing.  Fact Sheet for Patients: EntrepreneurPulse.com.au  Fact Sheet for  Healthcare Providers: IncredibleEmployment.be  This test is not yet approved or cleared by the [place] FDA and has been authorized for detection and/or diagnosis of SARS-CoV-2 by FDA under an Emergency Use Authorization (EUA). This EUA will remain in effect (meaning this test can be used) for the duration of the COVID-19 declaration under Section 564(b)(1) of the Act, 21 U.S.C. section 360bbb-3(b)(1), unless the authorization is terminated or revoked.     Resp Syncytial Virus by PCR NEGATIVE NEGATIVE Final    Comment: (NOTE) Fact  Sheet for Patients: EntrepreneurPulse.com.au  Fact Sheet for Healthcare Providers: IncredibleEmployment.be  This test is not yet approved or cleared by the [place] FDA and has been authorized for detection and/or diagnosis of SARS-CoV-2 by FDA under an Emergency Use Authorization (EUA). This EUA will remain in effect (meaning this test can be used) for the duration of the COVID-19 declaration under Section 564(b)(1) of the Act, 21 U.S.C. section 360bbb-3(b)(1), unless the authorization is terminated or revoked.  Performed at [facility], [address] [place], [place] [address]   MRSA Next Gen by PCR, Nasal     Status: None   Collection Time: 07/10/22  6:31 PM   Specimen: Nasal Mucosa; Nasal Swab  Result Value Ref Range Status   MRSA by PCR Next Gen NOT DETECTED NOT DETECTED Final    Comment: (NOTE) The GeneXpert MRSA Assay (FDA approved for NASAL specimens only), is one component of a comprehensive MRSA colonization surveillance program. It is not intended to diagnose MRSA infection nor to guide or monitor treatment for MRSA infections. Test performance is not FDA approved in patients less than 82 years old. Performed at [facility], [address] [place], [place] [address]      Labs: CBC: Recent Labs  Lab 07/09/22 2208 07/10/22 0520 07/11/22 0943  07/12/22 0525 07/13/22 0618 07/14/22 0807 07/15/22 0424  WBC 7.8   < > 8.3 10.3 6.9 6.1 7.8  NEUTROABS 5.2  --   --   --   --   --   --   HGB 10.7*   < > 11.0* 10.2* 10.3* 10.8* 11.2*  HCT 36.1   < > 36.5 33.4* 34.1* 35.9* 37.7  MCV 68.1*   < > 66.5* 66.7* 67.7* 66.5* 68.1*  PLT 223   < > 233 187 191 235 255   < > = values in this interval not displayed.   Basic Metabolic Panel: Recent Labs  Lab 07/10/22 1104 07/11/22 0943 07/12/22 0525 07/13/22 0618 07/14/22 0807 07/15/22 0424 07/16/22 0340  NA  --  138 137 137 138 144 141  K  --  3.7 3.2* 3.7 3.2* 5.8* 4.8  CL  --  100 101 103 105 106 108  CO2  --  29 30 24 26 22 25   GLUCOSE  --  184* 158* 173* 103* 173* 246*  BUN  --  26* 29* 32* 31* 36* 43*  CREATININE  --  1.22* 1.40* 1.46* 1.15* 1.72* 1.64*  CALCIUM  --  8.9 8.3* 8.6* 8.7* 9.3 9.0  MG 1.9 1.7 1.7 1.7  --   --   --   PHOS 5.0* 4.1 3.8 3.7  --   --   --    Liver Function Tests: Recent Labs  Lab 07/09/22 2208  AST 113*  ALT 75*  ALKPHOS 110  BILITOT 1.3*  PROT 6.8  ALBUMIN 3.0*   No results for input(s): "LIPASE", "AMYLASE" in the last 168 hours. No results for input(s): "AMMONIA" in the last 168 hours. Cardiac Enzymes: No results for input(s): "CKTOTAL", "CKMB", "CKMBINDEX", "TROPONINI" in the last 168 hours. BNP (last 3 results) Recent Labs    12/11/21 2328 07/09/22 2208  BNP 325.1* 2,688.1*   CBG: Recent Labs  Lab 07/15/22 1503 07/15/22 1935 07/15/22 2346 07/16/22 0359 07/16/22 0747  GLUCAP 195* 263* 314* 254* 200*    Time spent: 35 minutes  Signed:  Val Riles  [facility] 07/16/2022 11:39 AM

## 2022-07-16 NOTE — Plan of Care (Signed)

## 2022-07-16 NOTE — TOC Transition Note (Signed)
Transition of Care [facility]) - CM/SW Discharge Note   Patient Details  Name: Natalie Adams MRN: [id number] Date of Birth: [birth year]  [facility] CM/SW Contact:  Rebekah Chesterfield, LCSW Phone Number: 07/16/2022, 12:38 PM   Clinical Narrative:    CSW spoke with Jhonnie Garner, who will have office contact family to schedule RN home visit. CSW provided update to pt's daughter, Mohini Pinion  Ambulance transport requested for patient.    Final next level of care: Home w Hospice Care Barriers to Discharge: Barriers Resolved   Patient Goals and CMS Choice      Discharge Placement                    Name of family member notified: Shinice Boakye, Daughter Patient and family notified of of transfer: 07/16/22  Discharge Plan and Services Additional resources added to the After Visit Summary for                                       Social Determinants of Health (SDOH) Interventions SDOH Screenings   Tobacco Use: Low Risk  (07/12/2022)     Readmission Risk Interventions     No data to display

## 2022-08-01 DEATH — deceased

## 2022-08-14 IMAGING — CT CT HEAD W/O CM
3 series · 15 of 47 positions shown, 18 images · non-contrast
Comparison: Head CT report from 07/20/2017 at [REDACTED]

CLINICAL DATA: Mental status change.  Unresponsive.

EXAM:
CT HEAD WITHOUT CONTRAST
TECHNIQUE: Contiguous axial images were obtained from the base of the skull
through the vertex without intravenous contrast.

[Series 3: head wo · axial · 0.43mm/px · z∈[-139,-14]mm · 9 of 31 slices shown, 12 images]
[im 3/31  brain]
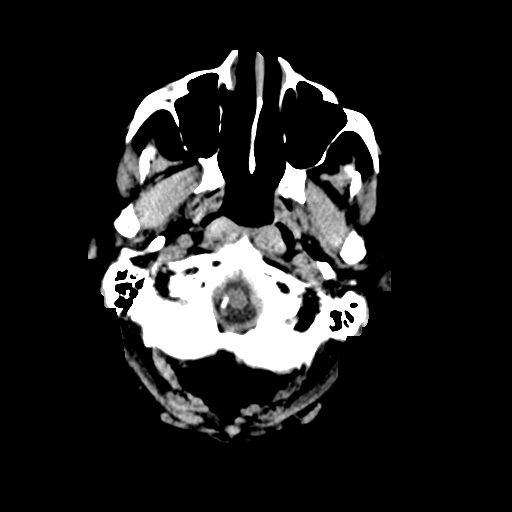
[im 3/31  bone]
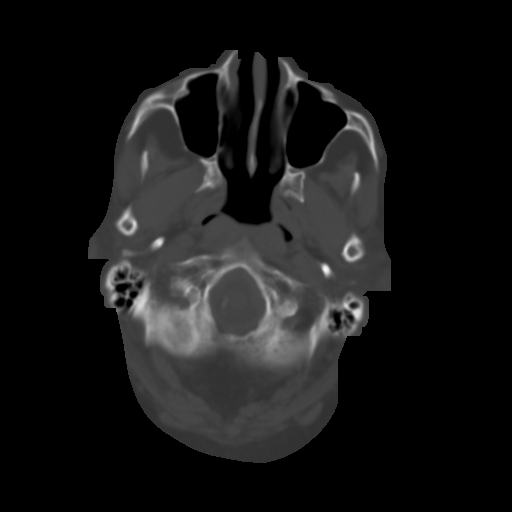
[im 6/31  brain]
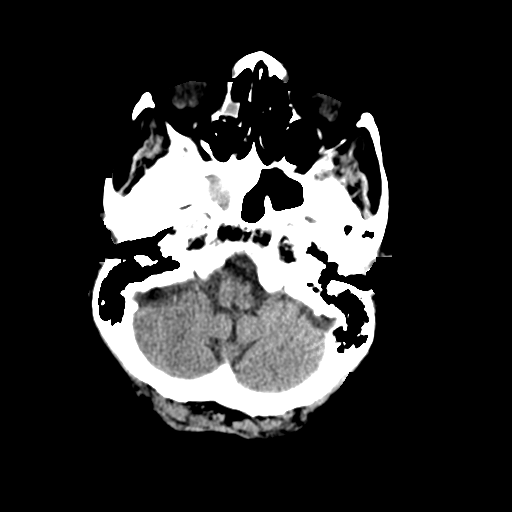
[im 9/31  brain]
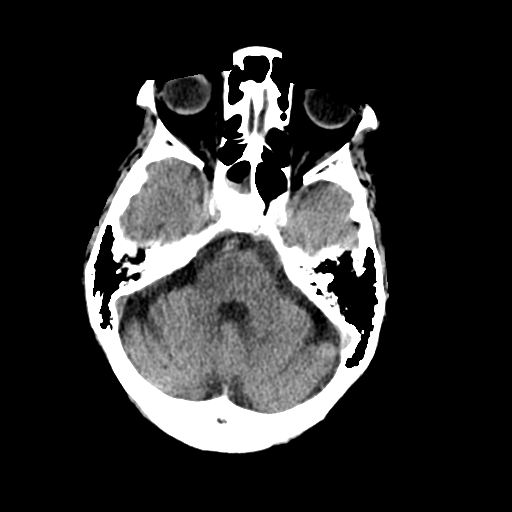
[im 12/31  brain]
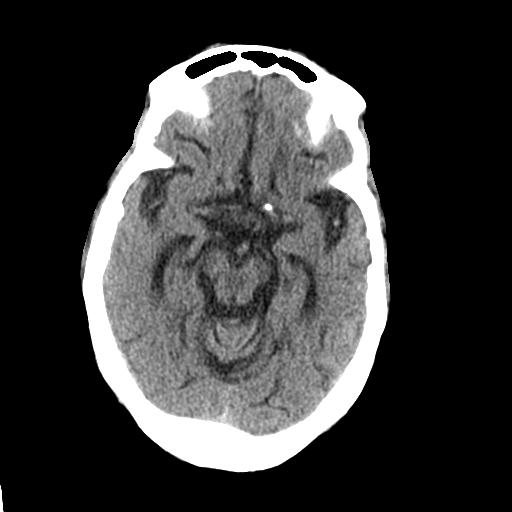
[im 16/31  brain]
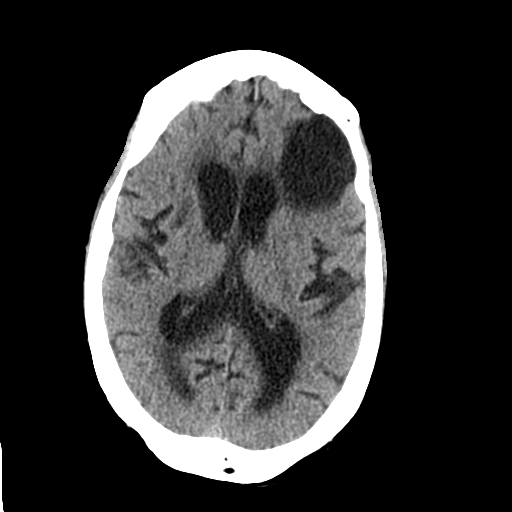
[im 16/31  bone]
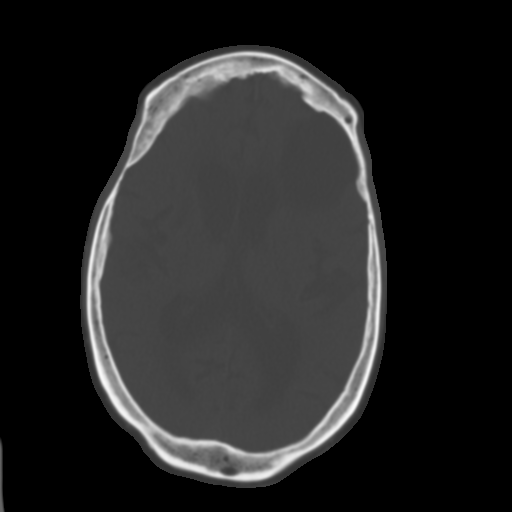
[im 19/31  brain]
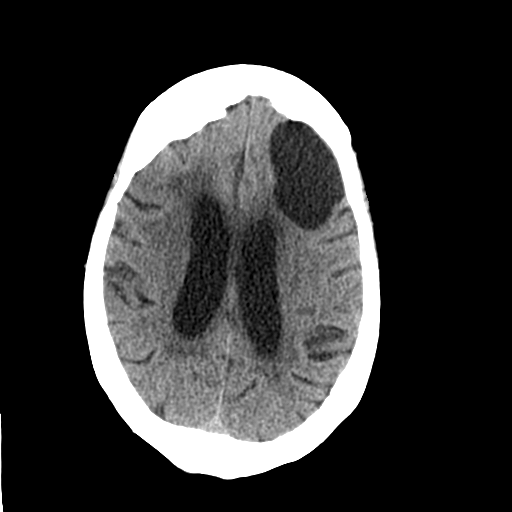
[im 22/31  brain]
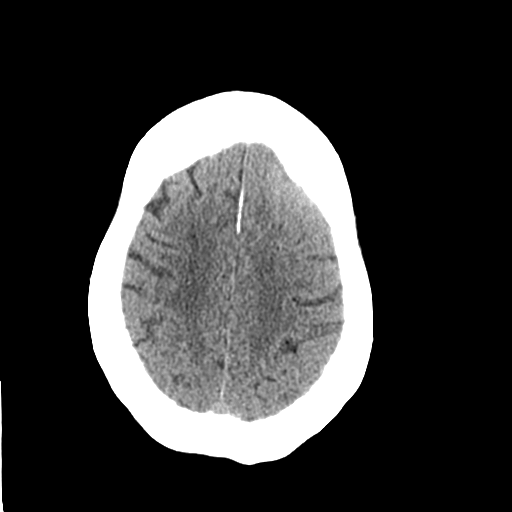
[im 25/31  brain]
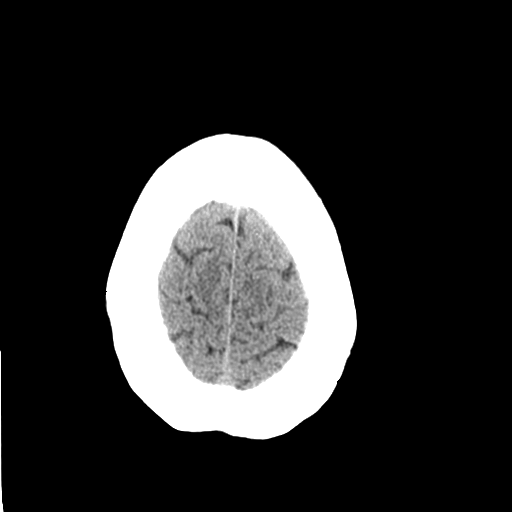
[im 28/31  brain]
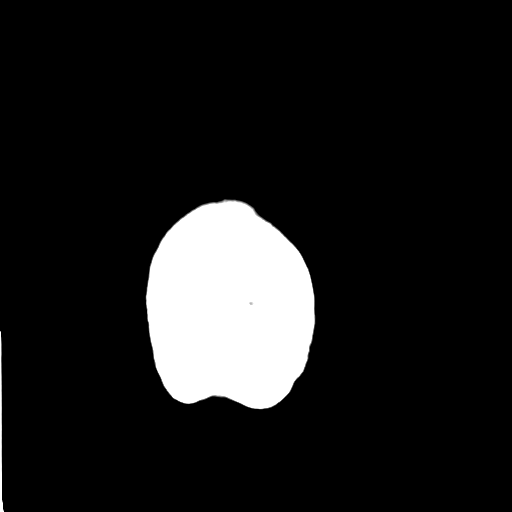
[im 28/31  bone]
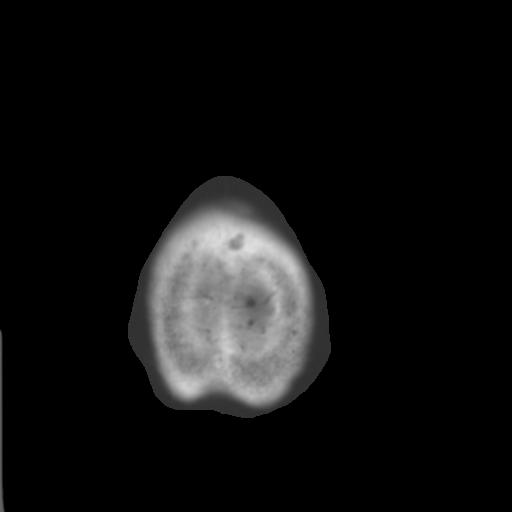

[Series 4: coronal soft tissue · coronal · 0.31mm/px · 3 of 67 slices shown]
[im 23/67  brain]
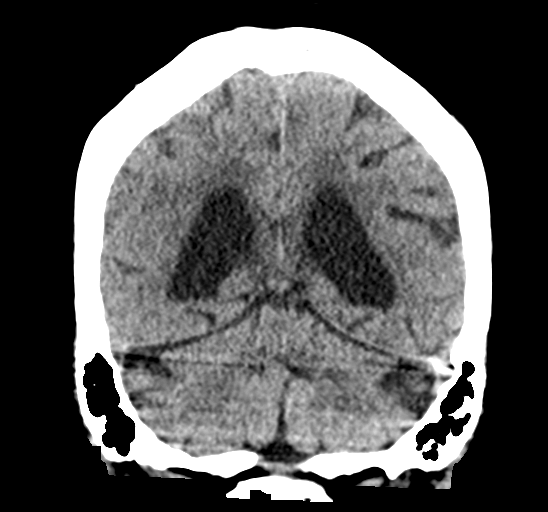
[im 30/67  brain]
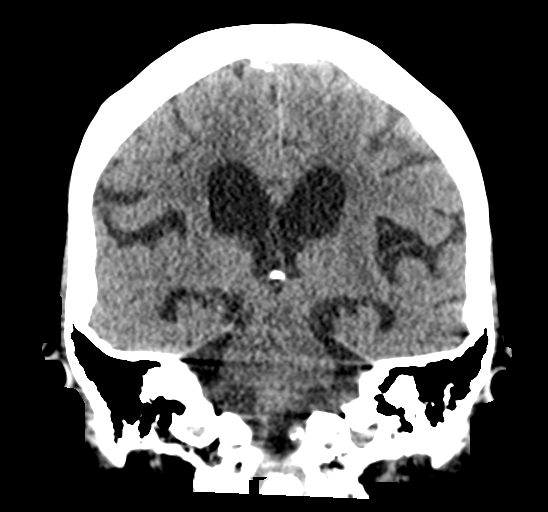
[im 37/67  brain]
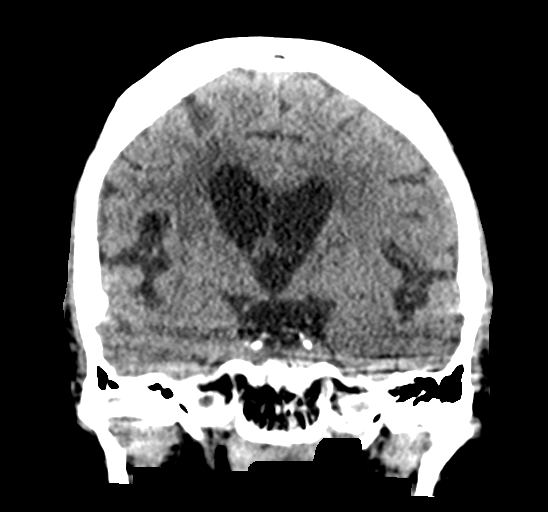

[Series 5: sagittal soft tissue · sagittal · 0.31mm/px · 3 of 56 slices shown]
[im 19/56  brain]
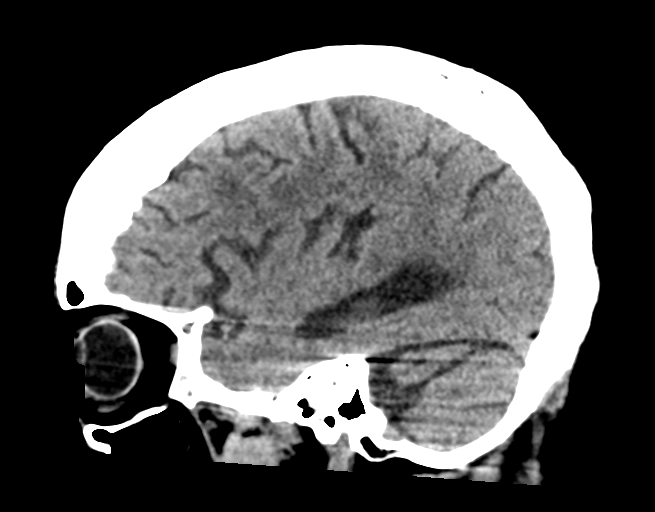
[im 28/56  brain]
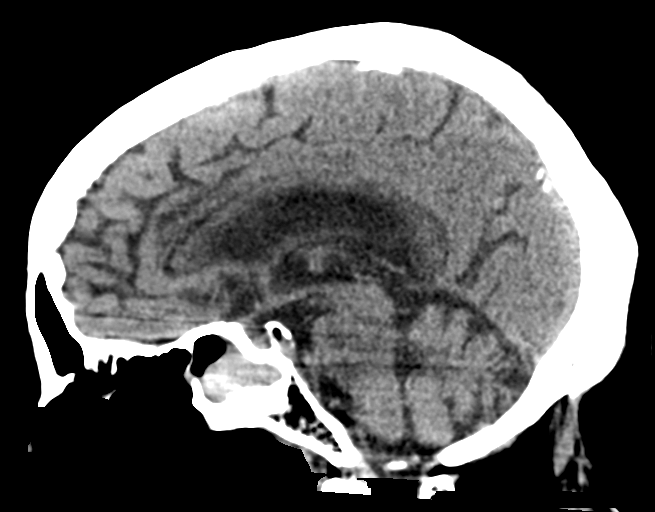
[im 37/56  brain]
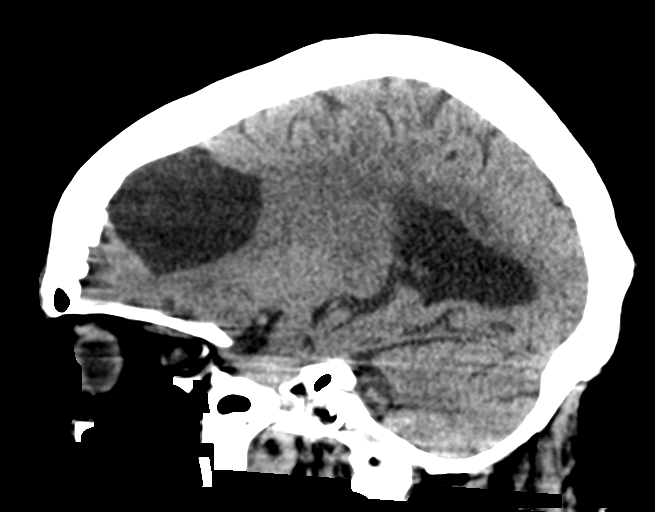

[15 of 47 positions shown; findings below may reference images not displayed]

FINDINGS: Brain: Cystic structure in the anterior left frontal region
measuring up to 4.7 cm. There appears to be gray matter along the
medial and posterior aspect of this cyst, presumably extra-axial.
Remote perforator infarct at the right basal ganglia. Possible prior
lacune in the left pons. Generalized brain atrophy. Chronic small
vessel ischemia in the deep cerebral white matter.

Vascular: Atheromatous calcification.

Skull: Hyperostosis interna.  Negative for fracture or bone lesion.

Sinuses/Orbits: Chronic right sphenoid sinusitis with sclerotic wall
thickening and subtotal opacification.
IMPRESSION: 1. No acute finding.
2. Chronic/senescent changes are described above
